# Patient Record
Sex: Male | Born: 1935 | Race: White | Hispanic: No | Marital: Married | Smoking: Former smoker
Health system: Southern US, Community
[De-identification: ages and names within clinical notes are randomized; demographics above are authoritative.]

## PROBLEM LIST (undated history)

## (undated) DIAGNOSIS — T07XXXA Unspecified multiple injuries, initial encounter: Secondary | ICD-10-CM

## (undated) DIAGNOSIS — D759 Disease of blood and blood-forming organs, unspecified: Secondary | ICD-10-CM

## (undated) DIAGNOSIS — I10 Essential (primary) hypertension: Secondary | ICD-10-CM

## (undated) DIAGNOSIS — K219 Gastro-esophageal reflux disease without esophagitis: Secondary | ICD-10-CM

## (undated) DIAGNOSIS — E119 Type 2 diabetes mellitus without complications: Secondary | ICD-10-CM

## (undated) DIAGNOSIS — I2510 Atherosclerotic heart disease of native coronary artery without angina pectoris: Secondary | ICD-10-CM

## (undated) DIAGNOSIS — D649 Anemia, unspecified: Secondary | ICD-10-CM

## (undated) HISTORY — DX: Anemia, unspecified: D64.9

---
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
## 2001-05-04 ENCOUNTER — Encounter (INDEPENDENT_AMBULATORY_CARE_PROVIDER_SITE_OTHER): Payer: Self-pay | Admitting: Specialist

## 2001-05-04 ENCOUNTER — Ambulatory Visit (HOSPITAL_COMMUNITY): Admission: RE | Admit: 2001-05-04 | Discharge: 2001-05-04 | Payer: Self-pay | Admitting: Gastroenterology

## 2003-02-24 ENCOUNTER — Encounter: Admission: RE | Admit: 2003-02-24 | Discharge: 2003-02-24 | Payer: Self-pay | Admitting: Geriatric Medicine

## 2003-08-23 ENCOUNTER — Ambulatory Visit (HOSPITAL_COMMUNITY): Admission: RE | Admit: 2003-08-23 | Discharge: 2003-08-23 | Payer: Self-pay | Admitting: Gastroenterology

## 2003-08-26 ENCOUNTER — Ambulatory Visit (HOSPITAL_COMMUNITY): Admission: RE | Admit: 2003-08-26 | Discharge: 2003-08-26 | Payer: Self-pay | Admitting: Gastroenterology

## 2003-11-14 ENCOUNTER — Inpatient Hospital Stay (HOSPITAL_COMMUNITY): Admission: RE | Admit: 2003-11-14 | Discharge: 2003-11-17 | Payer: Self-pay | Admitting: Orthopedic Surgery

## 2005-02-18 HISTORY — PX: JOINT REPLACEMENT: SHX530

## 2008-03-21 ENCOUNTER — Encounter: Admission: RE | Admit: 2008-03-21 | Discharge: 2008-03-21 | Payer: Self-pay | Admitting: Geriatric Medicine

## 2008-03-23 ENCOUNTER — Encounter: Admission: RE | Admit: 2008-03-23 | Discharge: 2008-03-23 | Payer: Self-pay | Admitting: Geriatric Medicine

## 2010-02-16 ENCOUNTER — Encounter
Admission: RE | Admit: 2010-02-16 | Discharge: 2010-02-16 | Payer: Self-pay | Source: Home / Self Care | Attending: Gastroenterology | Admitting: Gastroenterology

## 2010-04-02 ENCOUNTER — Ambulatory Visit (HOSPITAL_COMMUNITY)
Admission: RE | Admit: 2010-04-02 | Discharge: 2010-04-02 | Disposition: A | Discharge status: Home / Self Care | Payer: Medicare Other | Source: Ambulatory Visit | Attending: Gastroenterology | Admitting: Gastroenterology

## 2010-04-02 DIAGNOSIS — K219 Gastro-esophageal reflux disease without esophagitis: Secondary | ICD-10-CM | POA: Insufficient documentation

## 2010-07-06 NOTE — Discharge Summary (Signed)
[name], [name]                ACCOUNT NO.:  [id number]   MEDICAL RECORD NO.:  [id number]          PATIENT TYPE:  INP   LOCATION:  5037                         FACILITY:  [facility]   PHYSICIAN:  Feliberto Gottron. Turner Daniels, M.D.   DATE OF BIRTH:  December 18, 1922   DATE OF ADMISSION:  11/14/2003  DATE OF DISCHARGE:  11/17/2003                                 DISCHARGE SUMMARY   PRIMARY DIAGNOSIS:  End-stage degenerative joint disease of the right hip.   PROCEDURE:  Right total hip arthroplasty.   HISTORY OF PRESENT ILLNESS:  The patient is a 77 year old male with a many  year history of increasing pain in the right groin.  X-ray showed bone on  bone changes of DJD in the right hip.  He has failed conservative treatment  including NSAIDs, rest, cane use and PT.  He now wishes to proceed with  total hip arthroplasty after discussing risks versus benefits.   ALLERGIES:  No known drug allergies.   MEDICATION ON ADMISSION:  Tylenol.   PAST MEDICAL HISTORY:  Usual childhood illnesses.  Adult history positive  for hypertension and DJD.   PAST SURGICAL HISTORY:  Hernia repair in 1953.  No difficulty with GET.   SOCIAL HISTORY:  No tobacco.  Positive ethanol occasionally.  No IV drug  abuse.  He is married and retired.   FAMILY HISTORY:  Mother died at 48 with history of congestive heart  failure.  Father died at 77 with history of aneurysm.   REVIEW OF SYMPTOMS:  Positive for glasses and difficulty swallowing.  Denies  shortness of breath, chest pain or illness.   PHYSICAL EXAMINATION AT TIME OF ADMISSION:  GENERAL APPEARANCE:  The patient  is a 5 feet 10 inch, 196 pound male.  VITAL SIGNS:  Temperature 97.6, pulse 76, respiratory rate 20, blood  pressure 130/80.  HEENT:  Head is normocephalic and atraumatic.  Ears:  TMs are clear.  Eyes:  Pupils are equal, round, reactive to light and accommodation.  Nose is  patent.  Throat benign.  NECK:  Supple with full range of motion.  CHEST:  Clear to  auscultation and percussion.  CARDIOVASCULAR:  Regular rate and rhythm.  ABDOMEN:  Soft and nontender.  EXTREMITIES:  The right hip has range of motion with 5 degree of internal  rotation, 10 degrees of external rotation, positive pain to log roll.  Minimal pain to foot tap.  Otherwise neurovascularly intact.   X-ray showed bone on bone changes and weightbearing structures of the right  rib and left hip.   Preoperative labs including CBC, CMET, chest x-ray, EKG, PT and PTT are all  within normal limits with the exception of glucose of 158 and total protein  of 58.   HOSPITAL COURSE:  On the day of admission the patient was taken to the  operating room at [facility] where he underwent a right  total hip arthroplasty utilizing Osteonics, Trident acetabulum, 56 mm  acetabulum and Omniflex 132 degree hip stem #8 and 10 degree polyethylene  insert to accept a 36 mm head, distal  cement spacer 16 mm and cement plug  size 7.  Hip stem was cemented with polymethyl methacrylate that had been  impregnated with Zinacef.  The cup was not cemented.  The patient was placed  on perioperative antibiotics.  He was placed on postoperative Coumadin  prophylaxis with a target INR of 1.52.  He was placed on postoperative  Dilaudid PCA for pain control.   On postoperative day #1, the patient was without complaints.  No nausea or  vomiting.  Voiding without difficulty with hemoglobin 11.5, INR 1.2.  He was  afebrile, vital signs were stable.  Dressing was dry.  Thigh was soft.  He  was neurovascularly intact to light touch and motors.  He continued with his  physical therapy.  PCA was discontinued.  Discharge planning was begun for  home health care.   On postoperative day #2, the patient was without complaints.  T-max 100.3,  ranging 99.5.  Hemoglobin 11.4, INR 1.6.  Continued to make steady progress  in physical therapy and occupational therapy.   On postoperative day #3, the  patient was without complaints, making steady  progress in PT.  He was afebrile.  Vital signs were stable.  Hemoglobin  11.3, INR 1.6.  Dressing was dry.  Wound was benign.  Thigh was soft and  minimally tender.  He was discharged home after completing with PT and OT  goals having been met.   DISCHARGE MEDICATIONS:  1.  Tylox.  2.  Coumadin per [facility] x2 weeks postoperatively.  3.  Resume all home medications.   DIET:  Regular.   ACTIVITY:  Weightbearing as tolerated with total hip precautions using  walker or crutches.  Dressing changes every other day.  May shower  postoperative day #7 seated.  Home health PT, home health RN and Durable  Goods per [facility].   Return to clinic in approximately one week with Dr. Turner Daniels.  Call [phone]  for appointment, sooner if he should have any difficulties with increased  temperature, increased pain or drainage from the wound.      Lidia Collum  D:  12/12/2003  T:  12/12/2003  Job:  [id number]

## 2010-07-06 NOTE — Op Note (Signed)
[name], [name]                ACCOUNT NO.:  [id number]   MEDICAL RECORD NO.:  [id number]          PATIENT TYPE:  INP   LOCATION:  2870                         FACILITY:  [facility]   PHYSICIAN:  Feliberto Gottron. Turner Daniels, M.D.   DATE OF BIRTH:  [birth year]   DATE OF PROCEDURE:  11/14/2003  DATE OF DISCHARGE:                                 OPERATIVE REPORT   PREOPERATIVE DIAGNOSIS:  Degenerative arthritis of the right hip.   POSTOPERATIVE DIAGNOSIS:  Degenerative arthritis of the right hip.   OPERATION PERFORMED:  Right total hip arthroplasty using Osteonics 56 mm  Secure-Fit HA cup, 36 mm poly liner with a 10 degree lip, a #8 ODC FX stem,  16 tip, #7 cement restricter and an NK +5 36 mm cobalt chrome ball.   SURGEON:  Feliberto Gottron. Turner Daniels, M.D.   ASSISTANTLaural Benes. Jannet Mantis.   ANESTHESIA:  General endotracheal.   ESTIMATED BLOOD LOSS:  300 mL.   FLUIDS REPLACED:  1500 mL crystalloid.   DRAINS:  None.   TOURNIQUET TIME:  None.   INDICATIONS FOR PROCEDURE:  The patient is a 77 year old gentleman I have  followed for a number of years for end stage arthritis of the right hip that  is now so severe it is interfering with ambulation as well as swimming which  is one of his recreational activities.  He has failed conservative treatment  with anti-inflammatory medication, observation, use of a walker and desires  elective right total hip arthroplasty, well aware of the risks and benefits  of surgery as are his family.   DESCRIPTION OF PROCEDURE:  The patient was identified by arm band and taken  to the operating room at [facility] main hospital where appropriate anesthetic  monitors were attached and general endotracheal anesthesia induced with the  patient in the supine position.  The patient was then rolled into the left  lateral decubitus position and fixed there with a Stulberg Mark 2 pelvic  clamp and the right lower extremity prepped and draped in the usual sterile  fashion from the ankle  to the hemipelvis.  We then made a 15 cm incision  centered over the greater trochanter allowing a posterolateral approach to  the hip through the skin and subcutaneous tissue.  Small bleeders were  identified and cauterized.  The iliotibial band was cut in line with the  skin incision exposing the greater trochanter.  A Cobra retractor was placed  between the superior hip joint capsule and the gluteus minimus and a second  cobra between the quadratus femoris and the inferior hip joint capsule.  The  piriformis and short external rotators were tagged with one #2 Ethibond  suture and cut off at their insertion on the intertrochanteric crest  exposing the posterior aspect of the hip joint capsule which was then  developed into an acetabular based flap.  The rectus femoris origin was  recessed with the electrocautery and posterior superior and posterior  inferior wing retractors were placed in the acetabulum.  This allowed  removal of the labrum which was noted to be significantly  arthritic after  dislocating the femoral head.  The femoral head was noted to have bare bone  arthritis and a standard neck cut was performed one fingerbreadth above the  lesser trochanter.  This enhanced our acetabular exposure and we then  sequentially reamed up to a 56 mm basket reamer obtaining good cut in  anterior posterior columns as well as superior.  We then hammered into place  a 56 mm Secure-Fit HA shell in 45 degrees abduction and 20 degrees of  anteversion and placed a 10 degree trial poly liner 36 mm with index  posterior and superior.  It was then flexed and internally rotated, exposing  the proximal femur which was then reamed up to a #8 tapered reamer followed  by broaching up to a #8 broach which had the correct fit and feel for the  proximal femur.  We then finished the neck cut with the calcar reamers and a  standard 35 neck and a +0 and +5 36 mm balls were placed on the broach and  trial  reduction was performed.  The best fit and stability was with the +5.  We could flex to 90 and internally rotate to 50 and had to come to full  extension and bend the knee well past 120 degrees.  At this point the trial  components were removed.  A real 36 mm cross-fire polyethylene liner was  placed in the shell after putting in the central occluder screw first with  the index posterior and superior.  We then sized for a #7 cement restricter  which was then inserted into the appropriate depth in the proximal femur and  the proximal femur was then pulse lavaged clean and dried with suction and  sponges.  A double batch of Howmedica Simplex polymethyl methacrylate cement  with 1500 mg of Zinacef was then mixed and injected into the proximal femur  under pressure followed by a #8 ODC stem with a 16 mm tip in 20 degrees of  anteversion.  Excess cement was removed as the cement was allowed to cure  and an NK +5 36 mm ball was then hammered onto the stem.  The hip was  reduced, stability checked and found to be excellent.  The wound was  irrigated out with normal saline solution and pulse lavaged.  The capsule  and short external rotators were repaired back to the intertrochanteric  crest through drill holes and a #2 Ethibond suture.  The  iliotibial band  was closed with running #1 Vicryl suture.  The subcutaneous tissue with 0  and 2-0 undyed Vicryl suture.  A dressing of Xeroform, 4 x 4 dressing  sponges, and HypaFix tape was applied.  The patient was rolled supine  awakened and taken to the recovery room without difficulty.       FJR/MEDQ  D:  11/14/2003  T:  11/14/2003  Job:  [id number]

## 2010-07-06 NOTE — Op Note (Signed)
[name], [name]                ACCOUNT NO.:  [id number]   MEDICAL RECORD NO.:  [id number]          PATIENT TYPE:  INP   LOCATION:  5037                         FACILITY:  [facility]   PHYSICIAN:  Feliberto Gottron. Turner Daniels, M.D.   DATE OF BIRTH:  1923/01/03   DATE OF PROCEDURE:  11/14/2003  DATE OF DISCHARGE:  11/17/2003                                 OPERATIVE REPORT   PREOPERATIVE DIAGNOSIS:  End-stage degenerative arthritis of the right hip.   POSTOPERATIVE DIAGNOSIS:  End-stage degenerative arthritis of the right hip.   PROCEDURE:  Right total hip arthroplasty.   ANESTHESIA:  General endotracheal.   SURGEON:  Feliberto Gottron. Turner Daniels, M.D.   FIRST ASSISTANT:  Harvie Junior, M.D.   ESTIMATED BLOOD LOSS:  300 mL.   FLUIDS REPLACED:  300 mL crystalloid.   DRAINS PLACED:  None.   Implants were as follows:  We used a 54 mm Howmedica Trident acetabular  shell.  We used a #8 Omnifit HFX stem, 10 degree liner, 36 mm head, 16 mm  cement restricter, and a #7 cement plug.   INDICATION FOR PROCEDURE:  A 77 year old gentleman with end-stage arthritis  of the right hip, who has failed conservative treatment with anti-  inflammatory medicines and observation.  It is affecting his ability to  ambulate, wakes him up at night, and it may cause him to fall at some point  if he continues to have severe pain.  He also has very mild Alzheimer's  disease but lives with his family and is well cared for.  X-rays showed bone-  on-bone arthritic changes.   DESCRIPTION OF PROCEDURE:  The patient was identified by arm band, taken to  the operating room at [facility] main hospital, appropriate anesthetic monitors  were attached and general endotracheal anesthesia induced with the patient  in the supine position.  He was then rolled into the left lateral decubitus  position and fixed there with a Veronda Prude II pelvic clamp and the right  lower extremity prepped and draped in the usual sterile fashion from the  ankle to  the hemipelvis.  The skin along the lateral hip and thigh was then  infiltrated with 20 mL of 0.5% Marcaine and epinephrine solution and a 15 cm  incision allowing a posterolateral approach to the hip was then made,  centered over the greater trochanter.  Small bleeders in the skin and  subcutaneous tissue were identified and cauterized.  The IT band was cut in  line with the skin incision, exposing the greater trochanter. A cobra  retractor was placed between the gluteus minimus and the superior hip joint  capsule and the second between the quadratus femoris and the inferior hip  joint capsule.  This isolated the short external rotators and piriformis,  which was then cut off their insertion on the intertrochanteric crest and  tagged with two #2 Ethibond sutures.  This exposed the posterior acetabular  capsule, which was then developed into an acetabular-based flap and likewise  tagged with two #2 Ethibond sutures.  The hip was then flexed and internally  rotated, dislocating  the femoral head, which was noted to be completely  arthritic, bone and bone, in the __________ region, and then had a standard  neck cut performed one fingerbreadth above the lesser trochanter with a  power saw.  The proximal femur was then translocated anteriorly using a  Hohmann retractor, levering off the anterior column.  A cobra was placed in  the cotyloid notch and then posterior superior and posterior inferior wing  retractors were placed, allowing exposure of the acetabulum.  The labrum was  removed with the electrocautery.  Satisfied with the exposure, we then  reamed up to a 54 mm basket reamer, obtaining good coverage in all quadrants  down to subchondral bleeding bone.  We then hammered into place a 54 mm  Trident acetabular PSL shell without difficulty and placed a trial 10 degree  liner for a 36 head.  The shell was placed in 45 degrees of abduction and  about 20 degrees of anteversion.  The hip was  flexed and internally rotated,  exposing the proximal femur, which was entered with the tapered reamers,  reaming up to a #8 tapered reamer followed by broaching up to a #8 Osteonics  broach and calcar milling to create a flush calcar cut.  With the broach in  place, a 30 base neck and a +0 36 mm trial head were placed, the hip  reduced, and excellent stability noted.  The stem was in about 20 degrees of  anteversion following the calcar.  He could flex to 75 and internally rotate  to 60 before there was any dislocation and going into extension and external  rotation, he could not be dislocated.  The leg length was appropriate-  looking at the center of the ball at the same level as the greater  trochanter, and in extension the knee could be flexed fully.  At this point  the trial components were removed.  The wound was irrigated out with normal  saline solution.  A real polyethylene liner 10 degree was hammered into the  shell after first placing a centralizing screw plug.  The proximal femur was  then irrigated out with pulse lavage.  We sized for a #7 cement restricter  that was hammered to the appropriate depth, further lavage, and then drying  with suction and sponges.  A double batch of Howmedica Simplex cement with  1500 mg of Zinacef was then mixed and injected into the proximal femur with  the cement gun.  We then inserted a #8 stem and removed excess cement.  The  stem was held in 20 degrees of anteversion and the cement allowed to cure.  An NK +0 36 mm ball, L-fit, was then hammered onto the stem, the hip again  reduced, and excellent stability noted.  At this time we checked for any  further significant bleeders, none were found.  The wound was irrigated out  a final time with normal saline solution, and then the acetabular flap,  short external rotators were repaired back to the intertrochanteric crest through drill holes.  The IT band was closed with running #1 Vicryl suture,   the subcutaneous tissue with 0 and 2-0 undyed Vicryl suture, and the skin  with running interlocking 3-0 nylon suture.  A dressing of Xeroform, 4 x 4  dressing sponges and Hypafix tape applied.  The patient was unclamped,  rolled supine, awakened, and taken to the recovery room without difficulty.      Emilio Aspen  D:  01/02/2004  T:  01/02/2004  Job:  [id number]

## 2010-07-06 NOTE — Op Note (Signed)
NAME:  [name], [name]                          ACCOUNT NO.:  [id number]   MEDICAL RECORD NO.:  [id number]                   PATIENT TYPE:  AMB   LOCATION:  ENDO                                 FACILITY:  [facility]   PHYSICIAN:  James L. Malon Kindle., M.D.          DATE OF BIRTH:  May 08, 1922   DATE OF PROCEDURE:  08/22/2003  DATE OF DISCHARGE:                                 OPERATIVE REPORT   PROCEDURE:  Esophagogastroduodenoscopy.   MEDICATIONS:  Cetacaine spray, fentanyl 37.5 mcg, Versed 4 mg IV.   INDICATIONS:  The patient had a barium swallow for dysphagia, showed a  severe esophageal motility disorder.  This is done to evaluate for  stricture, achalasia, tumor, etc.   DESCRIPTION OF PROCEDURE:  The procedure had been explained to the patient  and consent obtained.  With the patient in the left lateral decubitus  position, the Olympus scope was inserted and advanced.  It advanced in the  esophagus easily with agglutination.  The distal esophagus was  endoscopically normal.  The GE junction opened easily.  We passed into the  stomach.  A complete endoscopy was performed, the gastric outlet identified  and passed.  The duodenum including the bulb and second portion was normal.  We got back into the stomach.  The pyloric channel was normal.  The antrum  and body of the stomach were normal.  The fundus and cardia were seen well  in the retroflexed view.  There was no sign of any tumor mass or anything of  that nature.  The scope was withdrawn and the GE junction again was normal.  There was no abnormality seen in the distal or proximal esophagus.  There  was no air-fluid level.  The proximal esophagus was seen well and was  normal.  The hypopharynx, etc., was seen well upon removal of the scope and  was normal.  The patient tolerated the procedure well and was maintained on  low-flow oxygen and pulse oximetry throughout the procedure.   ASSESSMENT:  Abnormal gastrointestinal x-ray  suggestive of a possible  achalasia versus motility disorder, no abnormalities on upper endoscopy.   PLAN:  Will schedule an outpatient manometry and see back in the office in  three to four weeks.                                               James L. Malon Kindle., M.D.    Waldron Session  D:  08/23/2003  T:  08/23/2003  Job:  [id number]   cc:   Hal T. Stoneking, M.D.  [address] [place], [place] [address]  Fax: [phone]

## 2010-07-06 NOTE — Op Note (Signed)
NAME:  [name], [name]                          ACCOUNT NO.:  [id number]   MEDICAL RECORD NO.:  [id number]                   PATIENT TYPE:  AMB   LOCATION:  ENDO                                 FACILITY:  [facility]   PHYSICIAN:  James L. Malon Kindle., M.D.          DATE OF BIRTH:  07-17-1922   DATE OF PROCEDURE:  08/26/2003  DATE OF DISCHARGE:  08/26/2003                                 OPERATIVE REPORT   PROCEDURE:  Esophageal manometry.   INDICATIONS FOR PROCEDURE:  Patient with persistent dysphagia.  Work-up to  date has shown dysmotility without narrowing.  A closure was suspected.  We  went ahead and did an endoscopy. This is done to evaluate for motility  disorder.  Barium swallow is suggested.   DESCRIPTION OF PROCEDURE:  The procedure was explained to the patient and he  underwent an manometry in the manometry lab according to the usual  protocols.  The results are as follows:  1. Upper esophageal sphincter not well seen.  2. Esophageal body marked increase in amplitude of 234 which is markedly     increased with normal in our lab being less than 180 with an increased     duration of 7.4 seconds and the distal esophagus normal being less than     6.0.  3. Lower esophageal sphincter marked increase in resting pressure of 91.7,     normal being less than 45.  The LAS does appear to relax with swallowing     by computer measurement as well as by my interpretation of the tracings     with an 82% relaxation.   ASSESSMENT:  Motility disorder that is not characteristic for achalasia.  In  the past, this has been termed vigorous achalasia with LES pressures  elevated but still with peristalsis in the esophagus of high amplitude.  This is a nonspecific finding and may well progress to achalasia.   PLAN:  Will see the patient back in the office and discuss all this with him  further.                                               James L. Malon Kindle., M.D.    Waldron Session  D:   09/03/2003  T:  09/04/2003  Job:  [id number]   cc:   Hal T. Stoneking, M.D.  [address] [place], [place] [address]  Fax: [phone]

## 2010-07-06 NOTE — Procedures (Signed)
[facility]  Patient:    [name], [name] Visit Number: [id number] MRN: [id number]          Service Type: END Location: ENDO Attending Physician:  Orland Mustard Dictated by:   Llana Aliment. Randa Evens, M.D. Proc. Date: 05/04/01 Admit Date:  05/04/2001   CC:         Dr. Dianna Limbo   Procedure Report  DATE OF BIRTH:  [birth year]  PROCEDURE:  Colonoscopy and polypectomy.  MEDICATIONS:  Fentanyl 75 mcg, Versed 6 mg IV.  SCOPE:  Pediatric Olympus video colonoscope.  INDICATIONS FOR PROCEDURE:  Routine colon cancer screening.  DESCRIPTION OF PROCEDURE:  The procedure had been explained to the patient and consent obtained. With the patient in the left lateral decubitus position, the Olympus pediatric video colonoscope was inserted and advanced under direct visualization. The prep excellent. We were able to reach cecum without difficulty. The ileocecal valve and appendiceal orifice were seen, scope withdrawn. The cecum, ascending colon, hepatic flexure, transverse colon, splenic flexure, descending and sigmoid colon were seen well. In the proximal descending colon, a 3 mm polyp was encountered and was cauterized and placed in jar #1. In the additional descending colon, a 3/4 cm polyp was snared and sucked through the scope and placed in jar #2. The remainder of the sigmoid colon and rectum were free of polyps and other lesions. The scope was withdrawn and the patient tolerated the procedure well and was maintained on low flow oxygen and pulse oximeter throughout the procedure.  ASSESSMENT:  Polyps removed from the descending colon.  PLAN:  Routine post polypectomy instructions. Will recommend repeating procedure in three years. Dictated by:   Llana Aliment. Randa Evens, M.D. Attending Physician:  Orland Mustard DD:  05/04/01 TD:  05/05/01 Job: [id number]

## 2011-11-20 ENCOUNTER — Telehealth (INDEPENDENT_AMBULATORY_CARE_PROVIDER_SITE_OTHER): Payer: Self-pay | Admitting: General Surgery

## 2011-11-20 NOTE — Telephone Encounter (Signed)
[facility] letting them know this pt has an appt set up with Dr. Daphine Deutscher on 10/10 at 12:00.

## 2011-11-28 ENCOUNTER — Ambulatory Visit (INDEPENDENT_AMBULATORY_CARE_PROVIDER_SITE_OTHER): Payer: Medicare Other | Admitting: Surgery

## 2011-11-28 ENCOUNTER — Encounter (INDEPENDENT_AMBULATORY_CARE_PROVIDER_SITE_OTHER): Payer: Self-pay | Admitting: Surgery

## 2011-11-28 ENCOUNTER — Telehealth (INDEPENDENT_AMBULATORY_CARE_PROVIDER_SITE_OTHER): Payer: Self-pay | Admitting: General Surgery

## 2011-11-28 VITALS — BP 138/88 | HR 70 | Temp 98.0°F | Resp 18 | Ht 68.0 in | Wt 195.4 lb

## 2011-11-28 DIAGNOSIS — K220 Achalasia of cardia: Secondary | ICD-10-CM | POA: Insufficient documentation

## 2011-11-28 NOTE — Telephone Encounter (Signed)
Message copied by Littie Deeds on Thu Nov 28, 2011  2:20 PM ------      Message from: Cathi Roan      Created: Thu Nov 28, 2011  1:21 PM      Regarding: 1st post op      Contact: [phone]       [phone] Needs 1st  Post op for Lap Heilla

## 2011-11-28 NOTE — Telephone Encounter (Signed)
Spoke with patient and informed him that his first PO appt w/ Dr. Kerry Fort will be on 1120 at 9:40.  I also informed him that I have already called Dr. Randa Evens office and am waiting to hear back from them on when they will schedule for him to have the endoscope .

## 2011-11-28 NOTE — Patient Instructions (Signed)
Achalasia Achalasia is a condition in which a person cannot get food through the lower esophagus. This is the tube that carries food from your mouth to your stomach. This happens because there are no nerves to the lower esophagus and the esophageal sphincter. This is the circular muscle between the stomach and esophagus that relaxes to allow food into the stomach. It then contracts to keep food in the stomach. This absence of nerves may be present since birth. This condition causes difficulty swallowing, chest pain, and food coming back up in the mouth after being swallowed. DIAGNOSIS  This condition is diagnosed by x-ray, pressure studies in the esophagus, and endoscopy. This is when your caregiver looks into your esophagus with a small flexible telescope. The portion of the esophagus above the narrowing is usually enlarged when the condition is present. TREATMENT   Soft diets are helpful.  Medications will help the food pass more easily into the stomach.  If conservative treatment (as above) does not work, stretching the end of the esophagus with a balloon may help.  Sometimes surgical treatment is used and a segment of esophagus is removed. SEEK IMMEDIATE MEDICAL CARE IF:  You are unable to keep fluids down or it feels as though food sticks in your chest area.  Vomiting becomes persistent.  Chest or belly pain develops, increases, or localizes.  You have a fever. If problems are continuing and not allowing you to live a normal lifestyle, talk with your caregiver and discuss medical means to help this. Document Released: 11/14/2004 Document Revised: 04/29/2011 Document Reviewed: 02/27/2006 Exit[employer] Patient Information 2013 [employer].  

## 2011-11-28 NOTE — Progress Notes (Signed)
Chief Complaint:  achalasia  History of Present Illness:  [name] is an 78 y.o. male who has been seen by Dr. Jim Edwards with achalasia. He's had this for some time and he scattered worsening symptoms. I saw him with his wife and he has gotten worse to where he is spitting up now and wants to have something done. I discussed Botox therapy with him as to Dr. Edwards that he would like to go ahead and think have the surgery. He does not appear his stated age his he looks to be about early 70s. He works out daily at the [employer] and manages to stay fit.  I described our myotomy with him and also discussed a new endoscopic myotomy with him as well. I discussed the complications of perforation and the risk is aware that. However like for Dr. Edwards to endoscope him upper days preop and lavages patulous esophagus. I did review his upper GI series after Dr. Edwards discussed them with me last week.  He wants to get this set up and we'll set up at [place] his convenience.  No past medical history on file.  Past Surgical History  Procedure Date  . Joint replacement 2007    rt hip   . Hernia repair 1952    Current Outpatient Prescriptions  Medication Sig Dispense Refill  . aspirin 81 MG tablet Take 325 mg by mouth daily. 1/2 aspirin qd      . calcium-vitamin D (OSCAL WITH D) 500-200 MG-UNIT per tablet Take 1 tablet by mouth daily.      . fish oil-omega-3 fatty acids 1000 MG capsule Take 2 g by mouth daily.      . multivitamin-lutein (OCUVITE-LUTEIN) CAPS Take 1 capsule by mouth daily.       Review of patient's allergies indicates no known allergies. Family History  Problem Relation Age of Onset  . Heart disease Mother   . Heart disease Father    Social History:   reports that he has quit smoking. He does not have any smokeless tobacco history on file. His alcohol and drug histories not on file.   REVIEW OF SYSTEMS - PERTINENT POSITIVES ONLY: Neg for DVT  Physical Exam:     Blood pressure 138/88, pulse 70, temperature 98 F (36.7 C), temperature source Oral, resp. rate 18, height 5' 8" (1.727 m), weight 195 lb 6.4 oz (88.633 kg). Body mass index is 29.71 kg/(m^2).  Gen:  WDWN WM NAD  Neurological: Alert and oriented to person, place, and time. Motor and sensory function is grossly intact  Head: Normocephalic and atraumatic.  Eyes: Conjunctivae are normal. Pupils are equal, round, and reactive to light. No scleral icterus.  Neck: Normal range of motion. Neck supple. No tracheal deviation or thyromegaly present.  Cardiovascular:  SR without murmurs or gallops.  No carotid bruits Respiratory: Effort normal.  No respiratory distress. No chest wall tenderness. Breath sounds normal.  No wheezes, rales or rhonchi.  Abdomen:  Flat and nontender GU: Musculoskeletal: Normal range of motion. Extremities are nontender. No cyanosis, edema or clubbing noted Lymphadenopathy: No cervical, preauricular, postauricular or axillary adenopathy is present Skin: Skin is warm and dry. No rash noted. No diaphoresis. No erythema. No pallor. Pscyh: Normal mood and affect. Behavior is normal. Judgment and thought content normal.   LABORATORY RESULTS: No results found for this or any previous visit (from the past 48 hour(s)).  RADIOLOGY RESULTS: No results found.  Problem List: There is no problem list   on file for this patient.   Assessment & Plan: Achalasia Lap Heller myotomy    Matt B. Triston Lisanti, MD, FACS  [facility] [phone] beeper [phone]  11/28/2011 12:41 PM     

## 2011-11-29 ENCOUNTER — Telehealth (INDEPENDENT_AMBULATORY_CARE_PROVIDER_SITE_OTHER): Payer: Self-pay | Admitting: General Surgery

## 2011-11-29 NOTE — Telephone Encounter (Signed)
Dr. Randa Evens office called to let me know that Mr. Zieger is set up to have his endoscope on 10/22 and that they would share their finding with [place] after his procedure.

## 2011-12-09 ENCOUNTER — Encounter (HOSPITAL_COMMUNITY): Payer: Self-pay | Admitting: Pharmacy Technician

## 2011-12-12 ENCOUNTER — Encounter (HOSPITAL_COMMUNITY)
Admission: RE | Admit: 2011-12-12 | Discharge: 2011-12-12 | Disposition: A | Discharge status: Home / Self Care | Payer: Medicare Other | Source: Ambulatory Visit | Attending: Surgery | Admitting: Surgery

## 2011-12-12 ENCOUNTER — Ambulatory Visit (HOSPITAL_COMMUNITY)
Admission: RE | Admit: 2011-12-12 | Discharge: 2011-12-12 | Disposition: A | Discharge status: Home / Self Care | Payer: Medicare Other | Source: Ambulatory Visit | Attending: Surgery | Admitting: Surgery

## 2011-12-12 ENCOUNTER — Encounter (HOSPITAL_COMMUNITY): Payer: Self-pay

## 2011-12-12 ENCOUNTER — Telehealth (INDEPENDENT_AMBULATORY_CARE_PROVIDER_SITE_OTHER): Payer: Self-pay | Admitting: General Surgery

## 2011-12-12 DIAGNOSIS — Z01812 Encounter for preprocedural laboratory examination: Secondary | ICD-10-CM | POA: Insufficient documentation

## 2011-12-12 DIAGNOSIS — Z01818 Encounter for other preprocedural examination: Secondary | ICD-10-CM | POA: Insufficient documentation

## 2011-12-12 DIAGNOSIS — K220 Achalasia of cardia: Secondary | ICD-10-CM | POA: Insufficient documentation

## 2011-12-12 HISTORY — DX: Gastro-esophageal reflux disease without esophagitis: K21.9

## 2011-12-12 HISTORY — DX: Type 2 diabetes mellitus without complications: E11.9

## 2011-12-12 LAB — CBC
HCT: 42.3 % (ref 39.0–52.0)
Hemoglobin: 14.6 g/dL (ref 13.0–17.0)
MCH: 29.6 pg (ref 26.0–34.0)
MCHC: 34.5 g/dL (ref 30.0–36.0)
MCV: 85.6 fL (ref 78.0–100.0)
RDW: 13.4 % (ref 11.5–15.5)

## 2011-12-12 LAB — BASIC METABOLIC PANEL
BUN: 18 mg/dL (ref 6–23)
Creatinine, Ser: 1.14 mg/dL (ref 0.50–1.35)
GFR calc Af Amer: 65 mL/min — ABNORMAL LOW (ref >90)
GFR calc non Af Amer: 56 mL/min — ABNORMAL LOW (ref >90)
Glucose, Bld: 118 mg/dL — ABNORMAL HIGH (ref 70–99)

## 2011-12-12 MED ORDER — CHLORHEXIDINE GLUCONATE 4 % EX LIQD: 1.0000 "application " | Freq: Once | CUTANEOUS | Status: DC

## 2011-12-12 NOTE — Pre-Procedure Instructions (Signed)
PREOP CBC, BMET, EKG AND CXR WERE DONE TODAY AT [facility] AS PER ANESTHESIOLOGIST'S GUIDELINES. 

## 2011-12-12 NOTE — Telephone Encounter (Signed)
Pat called to ask Dr. Daphine Deutscher if patient needed special diet before his surgery next week? I asked Dr. Daphine Deutscher and he said for pt to drink protein shakes until surgery. This was told to [facility] and she will tell pt/gy

## 2011-12-12 NOTE — Patient Instructions (Addendum)
YOUR SURGERY IS SCHEDULED AT [facility]  ON:  Tuesday  10/29  AT 12:45 PM  REPORT TO [facility] SHORT STAY CENTER AT:  10:45 AM      PHONE # FOR SHORT STAY IS [phone]  STOP ASPIRIN AND FISH OIL TODAY  FLEETS ENEMA NIGHT BEFORE YOUR SURGERY  DO NOT EAT OR DRINK ANYTHING AFTER MIDNIGHT THE NIGHT BEFORE YOUR SURGERY.  YOU MAY BRUSH YOUR TEETH, RINSE OUT YOUR MOUTH--BUT NO WATER, NO FOOD, NO CHEWING GUM, NO MINTS, NO CANDIES, NO CHEWING TOBACCO.  PLEASE TAKE THE FOLLOWING MEDICATIONS THE AM OF YOUR SURGERY WITH A FEW SIPS OF WATER:  PRILOSEC   IF YOU USE INHALERS--USE YOUR INHALERS THE AM OF YOUR SURGERY AND BRING INHALERS TO THE HOSPITAL -TAKE TO SURGERY.    IF YOU ARE DIABETIC:  DO NOT TAKE ANY DIABETIC MEDICATIONS THE AM OF YOUR SURGERY.  IF YOU TAKE INSULIN IN THE EVENINGS--PLEASE ONLY TAKE 1/2 NORMAL EVENING DOSE THE NIGHT BEFORE YOUR SURGERY.  NO INSULIN THE AM OF YOUR SURGERY.  IF YOU HAVE SLEEP APNEA AND USE CPAP OR BIPAP--PLEASE BRING THE MASK AND THE TUBING.  DO NOT BRING YOUR MACHINE.  DO NOT BRING VALUABLES, MONEY, CREDIT CARDS.  DO NOT WEAR JEWELRY, MAKE-UP, NAIL POLISH AND NO METAL PINS OR CLIPS IN YOUR HAIR. CONTACT LENS, DENTURES / PARTIALS, GLASSES SHOULD NOT BE WORN TO SURGERY AND IN MOST CASES-HEARING AIDS WILL NEED TO BE REMOVED.  BRING YOUR GLASSES CASE, ANY EQUIPMENT NEEDED FOR YOUR CONTACT LENS. FOR PATIENTS ADMITTED TO THE HOSPITAL--CHECK OUT TIME THE DAY OF DISCHARGE IS 11:00 AM.  ALL INPATIENT ROOMS ARE PRIVATE - WITH BATHROOM, TELEPHONE, TELEVISION AND WIFI INTERNET.  IF YOU ARE BEING DISCHARGED THE SAME DAY OF YOUR SURGERY--YOU CAN NOT DRIVE YOURSELF HOME--AND SHOULD NOT GO HOME ALONE BY TAXI OR BUS.  NO DRIVING OR OPERATING MACHINERY FOR 24 HOURS FOLLOWING ANESTHESIA / PAIN MEDICATIONS.  PLEASE MAKE ARRANGEMENTS FOR SOMEONE TO BE WITH YOU AT HOME THE FIRST 24 HOURS AFTER SURGERY. RESPONSIBLE DRIVER'S NAME___________________________                              PHONE #   _______________________                                  PLEASE READ OVER ANY  FACT SHEETS THAT YOU WERE GIVEN: MRSA INFORMATION, BLOOD TRANSFUSION INFORMATION, INCENTIVE SPIROMETER INFORMATION.

## 2011-12-13 ENCOUNTER — Encounter (INDEPENDENT_AMBULATORY_CARE_PROVIDER_SITE_OTHER): Payer: Self-pay

## 2011-12-17 ENCOUNTER — Inpatient Hospital Stay (HOSPITAL_COMMUNITY)
Admission: RE | Admit: 2011-12-17 | Discharge: 2011-12-20 | DRG: 328 | Disposition: A | Discharge status: Home / Self Care | Payer: Medicare Other | Source: Ambulatory Visit | Attending: Surgery | Admitting: Surgery

## 2011-12-17 ENCOUNTER — Encounter (HOSPITAL_COMMUNITY)
Admission: RE | Disposition: A | Discharge status: Home / Self Care | Payer: Self-pay | Source: Ambulatory Visit | Attending: Surgery

## 2011-12-17 ENCOUNTER — Encounter (HOSPITAL_COMMUNITY): Payer: Self-pay | Admitting: Anesthesiology

## 2011-12-17 ENCOUNTER — Ambulatory Visit (HOSPITAL_COMMUNITY): Payer: Medicare Other | Admitting: Anesthesiology

## 2011-12-17 ENCOUNTER — Encounter (HOSPITAL_COMMUNITY): Payer: Self-pay | Admitting: *Deleted

## 2011-12-17 DIAGNOSIS — K224 Dyskinesia of esophagus: Secondary | ICD-10-CM | POA: Diagnosis present

## 2011-12-17 DIAGNOSIS — K220 Achalasia of cardia: Secondary | ICD-10-CM

## 2011-12-17 DIAGNOSIS — K219 Gastro-esophageal reflux disease without esophagitis: Secondary | ICD-10-CM | POA: Diagnosis present

## 2011-12-17 HISTORY — PX: UPPER GI ENDOSCOPY: SHX6162

## 2011-12-17 HISTORY — PX: HELLER MYOTOMY: SHX5259

## 2011-12-17 LAB — CBC
HCT: 42.4 % (ref 39.0–52.0)
Hemoglobin: 14.9 g/dL (ref 13.0–17.0)
MCV: 84.6 fL (ref 78.0–100.0)
RBC: 5.01 MIL/uL (ref 4.22–5.81)
RDW: 13.2 % (ref 11.5–15.5)
WBC: 13.2 10*3/uL — ABNORMAL HIGH (ref 4.0–10.5)

## 2011-12-17 LAB — CREATININE, SERUM
GFR calc Af Amer: 64 mL/min — ABNORMAL LOW (ref >90)
GFR calc non Af Amer: 55 mL/min — ABNORMAL LOW (ref >90)

## 2011-12-17 LAB — GLUCOSE, CAPILLARY

## 2011-12-17 SURGERY — ESOPHAGOMYOTOMY, LAPAROSCOPIC, HELLER
Anesthesia: General | Site: Esophagus | Wound class: Clean Contaminated

## 2011-12-17 MED ORDER — PANTOPRAZOLE SODIUM 40 MG IV SOLR
40.0000 mg | Freq: Every day | INTRAVENOUS | Status: DC
  Administered 2011-12-17 – 2011-12-19 (×3): 40 mg via INTRAVENOUS
  Filled 2011-12-17 (×4): qty 40

## 2011-12-17 MED ORDER — ONDANSETRON HCL 4 MG/2ML IJ SOLN
INTRAMUSCULAR | Status: DC | PRN
  Administered 2011-12-17: 4 mg via INTRAVENOUS

## 2011-12-17 MED ORDER — DIPHENHYDRAMINE HCL 12.5 MG/5ML PO ELIX: 12.5000 mg | ORAL_SOLUTION | Freq: Four times a day (QID) | ORAL | Status: DC | PRN

## 2011-12-17 MED ORDER — CEFOXITIN SODIUM-DEXTROSE 1-4 GM-% IV SOLR (PREMIX)
INTRAVENOUS | Status: AC
  Filled 2011-12-17: qty 100

## 2011-12-17 MED ORDER — ONDANSETRON HCL 4 MG/2ML IJ SOLN: 4.0000 mg | Freq: Four times a day (QID) | INTRAMUSCULAR | Status: DC | PRN

## 2011-12-17 MED ORDER — ACETAMINOPHEN 10 MG/ML IV SOLN
INTRAVENOUS | Status: DC | PRN
  Administered 2011-12-17: 1000 mg via INTRAVENOUS

## 2011-12-17 MED ORDER — PROPOFOL 10 MG/ML IV BOLUS
INTRAVENOUS | Status: DC | PRN
  Administered 2011-12-17: 130 mg via INTRAVENOUS

## 2011-12-17 MED ORDER — PROMETHAZINE HCL 25 MG/ML IJ SOLN: 6.2500 mg | INTRAMUSCULAR | Status: DC | PRN

## 2011-12-17 MED ORDER — HEPARIN SODIUM (PORCINE) 5000 UNIT/ML IJ SOLN
5000.0000 [IU] | Freq: Once | INTRAMUSCULAR | Status: AC
  Administered 2011-12-17: 5000 [IU] via SUBCUTANEOUS
  Filled 2011-12-17: qty 1

## 2011-12-17 MED ORDER — HYDROMORPHONE HCL PF 1 MG/ML IJ SOLN: 0.2500 mg | INTRAMUSCULAR | Status: DC | PRN

## 2011-12-17 MED ORDER — SUFENTANIL CITRATE 50 MCG/ML IV SOLN
INTRAVENOUS | Status: DC | PRN
  Administered 2011-12-17 (×3): 10 ug via INTRAVENOUS
  Administered 2011-12-17: 20 ug via INTRAVENOUS

## 2011-12-17 MED ORDER — VITAMINS A & D EX OINT
TOPICAL_OINTMENT | CUTANEOUS | Status: AC
  Administered 2011-12-17: 1
  Filled 2011-12-17: qty 5

## 2011-12-17 MED ORDER — DEXTROSE 5 % IV SOLN
2.0000 g | INTRAVENOUS | Status: AC
  Administered 2011-12-17: 2 g via INTRAVENOUS
  Filled 2011-12-17: qty 2

## 2011-12-17 MED ORDER — 0.9 % SODIUM CHLORIDE (POUR BTL) OPTIME
TOPICAL | Status: DC | PRN
  Administered 2011-12-17: 1000 mL

## 2011-12-17 MED ORDER — BUPIVACAINE LIPOSOME 1.3 % IJ SUSP
20.0000 mL | Freq: Once | INTRAMUSCULAR | Status: AC
  Administered 2011-12-17: 20 mL
  Filled 2011-12-17: qty 20

## 2011-12-17 MED ORDER — ACETAMINOPHEN 10 MG/ML IV SOLN
INTRAVENOUS | Status: AC
  Filled 2011-12-17: qty 100

## 2011-12-17 MED ORDER — DEXAMETHASONE SODIUM PHOSPHATE 10 MG/ML IJ SOLN
INTRAMUSCULAR | Status: DC | PRN
  Administered 2011-12-17: 10 mg via INTRAVENOUS

## 2011-12-17 MED ORDER — DIPHENHYDRAMINE HCL 50 MG/ML IJ SOLN: 12.5000 mg | Freq: Four times a day (QID) | INTRAMUSCULAR | Status: DC | PRN

## 2011-12-17 MED ORDER — FENTANYL CITRATE 0.05 MG/ML IJ SOLN
INTRAMUSCULAR | Status: DC | PRN
  Administered 2011-12-17 (×2): 50 ug via INTRAVENOUS

## 2011-12-17 MED ORDER — NEOSTIGMINE METHYLSULFATE 1 MG/ML IJ SOLN
INTRAMUSCULAR | Status: DC | PRN
  Administered 2011-12-17: 4 mg via INTRAVENOUS

## 2011-12-17 MED ORDER — LIDOCAINE HCL (CARDIAC) 20 MG/ML IV SOLN
INTRAVENOUS | Status: DC | PRN
  Administered 2011-12-17: 100 mg via INTRAVENOUS

## 2011-12-17 MED ORDER — KCL IN DEXTROSE-NACL 20-5-0.45 MEQ/L-%-% IV SOLN
INTRAVENOUS | Status: AC
  Filled 2011-12-17: qty 1000

## 2011-12-17 MED ORDER — KCL IN DEXTROSE-NACL 20-5-0.45 MEQ/L-%-% IV SOLN
INTRAVENOUS | Status: DC
  Administered 2011-12-17 – 2011-12-18 (×2): via INTRAVENOUS
  Administered 2011-12-18: 100 mL/h via INTRAVENOUS
  Administered 2011-12-19 (×2): via INTRAVENOUS
  Filled 2011-12-17 (×7): qty 1000

## 2011-12-17 MED ORDER — HEPARIN SODIUM (PORCINE) 5000 UNIT/ML IJ SOLN
5000.0000 [IU] | Freq: Three times a day (TID) | INTRAMUSCULAR | Status: DC
  Administered 2011-12-17 – 2011-12-20 (×8): 5000 [IU] via SUBCUTANEOUS
  Filled 2011-12-17 (×11): qty 1

## 2011-12-17 MED ORDER — CHLORHEXIDINE GLUCONATE 4 % EX LIQD
1.0000 "application " | Freq: Once | CUTANEOUS | Status: DC
  Filled 2011-12-17: qty 15

## 2011-12-17 MED ORDER — CISATRACURIUM BESYLATE (PF) 10 MG/5ML IV SOLN
INTRAVENOUS | Status: DC | PRN
  Administered 2011-12-17: 3 mg via INTRAVENOUS
  Administered 2011-12-17: 10 mg via INTRAVENOUS
  Administered 2011-12-17: 2 mg via INTRAVENOUS

## 2011-12-17 MED ORDER — GLYCOPYRROLATE 0.2 MG/ML IJ SOLN
INTRAMUSCULAR | Status: DC | PRN
  Administered 2011-12-17: 0.6 mg via INTRAVENOUS

## 2011-12-17 MED ORDER — LACTATED RINGERS IR SOLN
Status: DC | PRN
  Administered 2011-12-17: 1000 mL

## 2011-12-17 MED ORDER — SUCCINYLCHOLINE CHLORIDE 20 MG/ML IJ SOLN
INTRAMUSCULAR | Status: DC | PRN
  Administered 2011-12-17: 80 mg via INTRAVENOUS

## 2011-12-17 MED ORDER — LACTATED RINGERS IV SOLN
INTRAVENOUS | Status: DC
  Administered 2011-12-17: 13:00:00 via INTRAVENOUS
  Administered 2011-12-17: 1000 mL via INTRAVENOUS

## 2011-12-17 SURGICAL SUPPLY — 45 items
ADH SKN CLS APL DERMABOND .7 (GAUZE/BANDAGES/DRESSINGS) ×2
APL SKNCLS STERI-STRIP NONHPOA (GAUZE/BANDAGES/DRESSINGS) ×2
APPLIER CLIP 5 13 M/L LIGAMAX5 (MISCELLANEOUS)
APR CLP MED LRG 5 ANG JAW (MISCELLANEOUS)
BENZOIN TINCTURE PRP APPL 2/3 (GAUZE/BANDAGES/DRESSINGS) ×3 IMPLANT
BLADE HEX COATED 2.75 (ELECTRODE) ×1 IMPLANT
CANISTER SUCTION 2500CC (MISCELLANEOUS) ×3 IMPLANT
CANNULA ENDOPATH XCEL 11M (ENDOMECHANICALS) ×7 IMPLANT
CLAMP ENDO BABCK 10MM (STAPLE) ×4 IMPLANT
CLIP APPLIE 5 13 M/L LIGAMAX5 (MISCELLANEOUS) IMPLANT
CLOTH BEACON ORANGE TIMEOUT ST (SAFETY) ×3 IMPLANT
COVER SURGICAL LIGHT HANDLE (MISCELLANEOUS) ×3 IMPLANT
DECANTER SPIKE VIAL GLASS SM (MISCELLANEOUS) ×2 IMPLANT
DERMABOND ADVANCED (GAUZE/BANDAGES/DRESSINGS) ×1
DERMABOND ADVANCED .7 DNX12 (GAUZE/BANDAGES/DRESSINGS) IMPLANT
DEVICE SUTURE ENDOST 10MM (ENDOMECHANICALS) ×1 IMPLANT
DISSECTOR BLUNT TIP ENDO 5MM (MISCELLANEOUS) ×3 IMPLANT
DRAIN PENROSE 18X1/2 LTX STRL (DRAIN) ×3 IMPLANT
DRAPE LAPAROSCOPIC ABDOMINAL (DRAPES) ×3 IMPLANT
ELECT REM PT RETURN 9FT ADLT (ELECTROSURGICAL) ×3
ELECTRODE REM PT RTRN 9FT ADLT (ELECTROSURGICAL) ×2 IMPLANT
GLOVE BIOGEL M 8.0 STRL (GLOVE) ×3 IMPLANT
GLOVE BIOGEL PI IND STRL 7.0 (GLOVE) ×2 IMPLANT
GLOVE BIOGEL PI INDICATOR 7.0 (GLOVE) ×1
GOWN STRL NON-REIN LRG LVL3 (GOWN DISPOSABLE) ×3 IMPLANT
GOWN STRL REIN XL XLG (GOWN DISPOSABLE) ×6 IMPLANT
HAND ACTIVATED (MISCELLANEOUS) ×3 IMPLANT
KIT BASIN OR (CUSTOM PROCEDURE TRAY) ×3 IMPLANT
NS IRRIG 1000ML POUR BTL (IV SOLUTION) ×3 IMPLANT
PENCIL BUTTON HOLSTER BLD 10FT (ELECTRODE) IMPLANT
SCISSORS LAP 5X35 DISP (ENDOMECHANICALS) ×3 IMPLANT
SET IRRIG TUBING LAPAROSCOPIC (IRRIGATION / IRRIGATOR) ×3 IMPLANT
SLEEVE XCEL OPT CAN 5 100 (ENDOMECHANICALS) ×3 IMPLANT
SOLUTION ANTI FOG 6CC (MISCELLANEOUS) ×3 IMPLANT
SUT ETHIBOND 2 0 SH (SUTURE)
SUT ETHIBOND 2 0 SH 36X2 (SUTURE) IMPLANT
SUT SURGIDAC NAB ES-9 0 48 120 (SUTURE) ×1 IMPLANT
SUT VIC AB 4-0 SH 18 (SUTURE) ×3 IMPLANT
SYR 30ML LL (SYRINGE) ×3 IMPLANT
TOWEL OR 17X26 10 PK STRL BLUE (TOWEL DISPOSABLE) ×3 IMPLANT
TRAY FOLEY CATH 14FRSI W/METER (CATHETERS) ×3 IMPLANT
TRAY LAP CHOLE (CUSTOM PROCEDURE TRAY) ×3 IMPLANT
TROCAR BLADELESS OPT 5 75 (ENDOMECHANICALS) ×3 IMPLANT
TROCAR XCEL NON-BLD 11X100MML (ENDOMECHANICALS) ×4 IMPLANT
TUBING FILTER THERMOFLATOR (ELECTROSURGICAL) ×3 IMPLANT

## 2011-12-17 NOTE — Anesthesia Preprocedure Evaluation (Addendum)
Anesthesia Evaluation  Patient identified by MRN, date of birth, ID band Patient awake    Reviewed: Allergy & Precautions, H&P , NPO status , Patient's Chart, lab work & pertinent test results  Airway Mallampati: II TM Distance: <3 FB Neck ROM: Full    Dental No notable dental hx. (+) Dental Advisory Given   Pulmonary neg pulmonary ROS,  breath sounds clear to auscultation  Pulmonary exam normal       Cardiovascular negative cardio ROS  Rhythm:Regular Rate:Normal     Neuro/Psych negative neurological ROS  negative psych ROS   GI/Hepatic Neg liver ROS, GERD-  Medicated and Poorly Controlled,  Endo/Other  negative endocrine ROS  Renal/GU negative Renal ROS  negative genitourinary   Musculoskeletal negative musculoskeletal ROS (+)   Abdominal   Peds negative pediatric ROS (+)  Hematology negative hematology ROS (+)   Anesthesia Other Findings   Reproductive/Obstetrics negative OB ROS                          Anesthesia Physical Anesthesia Plan  ASA: II  Anesthesia Plan: General   Post-op Pain Management:    Induction: Intravenous, Rapid sequence and Cricoid pressure planned  Airway Management Planned: Oral ETT  Additional Equipment:   Intra-op Plan:   Post-operative Plan: Extubation in OR  Informed Consent: I have reviewed the patients History and Physical, chart, labs and discussed the procedure including the risks, benefits and alternatives for the proposed anesthesia with the patient or authorized representative who has indicated his/her understanding and acceptance.   Dental advisory given  Plan Discussed with: CRNA and Surgeon  Anesthesia Plan Comments:         Anesthesia Quick Evaluation

## 2011-12-17 NOTE — Anesthesia Procedure Notes (Addendum)
Performed by: Florene Route   Procedure Name: Intubation Date/Time: 12/17/2011 1:06 PM Performed by: Leroy Libman L Patient Re-evaluated:Patient Re-evaluated prior to inductionOxygen Delivery Method: Circle system utilized Preoxygenation: Pre-oxygenation with 100% oxygen Intubation Type: IV induction, Rapid sequence and Cricoid Pressure applied Laryngoscope Size: Miller and 3 Grade View: Grade III Tube type: Oral Tube size: 8.0 mm Number of attempts: 1 Airway Equipment and Method: Bougie stylet Placement Confirmation: ETT inserted through vocal cords under direct vision,  breath sounds checked- equal and bilateral and positive ETCO2 Secured at: 22 cm Tube secured with: Tape Dental Injury: Teeth and Oropharynx as per pre-operative assessment  Difficulty Due To: Difficulty was anticipated and Difficult Airway- due to anterior larynx Future Recommendations: Recommend- induction with short-acting agent, and alternative techniques readily available

## 2011-12-17 NOTE — Interval H&P Note (Signed)
History and Physical Interval Note:  12/17/2011 12:20 PM  [name]  has presented today for surgery, with the diagnosis of achalsia  The various methods of treatment have been discussed with the patient and family. After consideration of risks, benefits and other options for treatment, the patient has consented to  Procedure(s) (LRB) with comments: LAPAROSCOPIC HELLER MYOTOMY (N/A) as a surgical intervention .  The patient's history has been reviewed, patient examined, no change in status, stable for surgery.  I have reviewed the patient's chart and labs.  Questions were answered to the patient's satisfaction.     Brianda Beitler B

## 2011-12-17 NOTE — Transfer of Care (Signed)
Immediate Anesthesia Transfer of Care Note  Patient: [name]  Procedure(s) Performed: Procedure(s) (LRB) with comments: LAPAROSCOPIC HELLER MYOTOMY (N/A) UPPER GI ENDOSCOPY (N/A)  Patient Location: PACU  Anesthesia Type:General  Level of Consciousness: awake and patient cooperative  Airway & Oxygen Therapy: Patient Spontanous Breathing and Patient connected to face mask oxygen  Post-op Assessment: Report given to PACU RN and Post -op Vital signs reviewed and stable  Post vital signs: Reviewed and stable  Complications: No apparent anesthesia complications

## 2011-12-17 NOTE — Progress Notes (Signed)
Dr. Rose in- made aware of patient's blood pressures. 

## 2011-12-17 NOTE — Anesthesia Postprocedure Evaluation (Signed)
  Anesthesia Post-op Note  Patient: [name]  Procedure(s) Performed: Procedure(s) (LRB): LAPAROSCOPIC HELLER MYOTOMY (N/A) UPPER GI ENDOSCOPY (N/A)  Patient Location: PACU  Anesthesia Type: General  Level of Consciousness: awake and alert   Airway and Oxygen Therapy: Patient Spontanous Breathing  Post-op Pain: mild  Post-op Assessment: Post-op Vital signs reviewed, Patient's Cardiovascular Status Stable, Respiratory Function Stable, Patent Airway and No signs of Nausea or vomiting  Post-op Vital Signs: stable  Complications: No apparent anesthesia complications

## 2011-12-17 NOTE — Brief Op Note (Signed)
12/17/2011  3:57 PM  PATIENT:  Lavonda Jumbo  78 y.o. male  PRE-OPERATIVE DIAGNOSIS:  achalsia  POST-OPERATIVE DIAGNOSIS:  ACHALSIA  PROCEDURE:  Procedure(s) (LRB) with comments: LAPAROSCOPIC HELLER MYOTOMY (N/A) UPPER GI ENDOSCOPY (N/A)  SURGEON:  Surgeon(s) and Role:    * Kandis Cocking, MD - Assisting    * Valarie Merino, MD - Primary  PHYSICIAN ASSISTANT:   ASSISTANTS: Ovidio Kin, MD, FACS   ANESTHESIA:   general  EBL:  Total I/O In: 1700 [I.V.:1700] Out: 250 [Urine:200; Blood:50]  BLOOD ADMINISTERED:none  DRAINS: none   LOCAL MEDICATIONS USED:  OTHER Exparel 20 cc  SPECIMEN:  No Specimen  DISPOSITION OF SPECIMEN:  N/A  COUNTS:  YES  TOURNIQUET:  * No tourniquets in log *  DICTATION: .Other Dictation: Dictation Number [id number]  PLAN OF CARE: Admit to inpatient   PATIENT DISPOSITION:  PACU - hemodynamically stable.   Delay start of Pharmacological VTE agent (>24hrs) due to surgical blood loss or risk of bleeding: no

## 2011-12-17 NOTE — H&P (View-Only) (Signed)
Chief Complaint:  achalasia  History of Present Illness:  [name] is an 78 y.o. male who has been seen by Dr. Vilinda Boehringer with achalasia. He's had this for some time and he scattered worsening symptoms. I saw him with his wife and he has gotten worse to where he is spitting up now and wants to have something done. I discussed Botox therapy with him as to Dr. Randa Evens that he would like to go ahead and think have the surgery. He does not appear his stated age his he looks to be about early 47s. He works out daily at [employer] and manages to stay fit.  I described our myotomy with him and also discussed a new endoscopic myotomy with him as well. I discussed the complications of perforation and the risk is aware that. However like for Dr. Randa Evens to endoscope him upper days preop and lavages patulous esophagus. I did review his upper GI series after Dr. Randa Evens discussed them with me last week.  He wants to get this set up and we'll set up at [facility] his convenience.  No past medical history on file.  Past Surgical History  Procedure Date  . Joint replacement 2007    rt hip   . Hernia repair 1952    Current Outpatient Prescriptions  Medication Sig Dispense Refill  . aspirin 81 MG tablet Take 325 mg by mouth daily. 1/2 aspirin qd      . calcium-vitamin D (OSCAL WITH D) 500-200 MG-UNIT per tablet Take 1 tablet by mouth daily.      . fish oil-omega-3 fatty acids 1000 MG capsule Take 2 g by mouth daily.      . multivitamin-lutein (OCUVITE-LUTEIN) CAPS Take 1 capsule by mouth daily.       Review of patient's allergies indicates no known allergies. Family History  Problem Relation Age of Onset  . Heart disease Mother   . Heart disease Father    Social History:   reports that he has quit smoking. He does not have any smokeless tobacco history on file. His alcohol and drug histories not on file.   REVIEW OF SYSTEMS - PERTINENT POSITIVES ONLY: Neg for DVT  Physical Exam:     Blood pressure 138/88, pulse 70, temperature 98 F (36.7 C), temperature source Oral, resp. rate 18, height 5\' 8"  (1.727 m), weight 195 lb 6.4 oz (88.633 kg). Body mass index is 29.71 kg/(m^2).  Gen:  WDWN WM NAD  Neurological: Alert and oriented to person, place, and time. Motor and sensory function is grossly intact  Head: Normocephalic and atraumatic.  Eyes: Conjunctivae are normal. Pupils are equal, round, and reactive to light. No scleral icterus.  Neck: Normal range of motion. Neck supple. No tracheal deviation or thyromegaly present.  Cardiovascular:  SR without murmurs or gallops.  No carotid bruits Respiratory: Effort normal.  No respiratory distress. No chest wall tenderness. Breath sounds normal.  No wheezes, rales or rhonchi.  Abdomen:  Flat and nontender GU: Musculoskeletal: Normal range of motion. Extremities are nontender. No cyanosis, edema or clubbing noted Lymphadenopathy: No cervical, preauricular, postauricular or axillary adenopathy is present Skin: Skin is warm and dry. No rash noted. No diaphoresis. No erythema. No pallor. Pscyh: Normal mood and affect. Behavior is normal. Judgment and thought content normal.   LABORATORY RESULTS: No results found for this or any previous visit (from the past 48 hour(s)).  RADIOLOGY RESULTS: No results found.  Problem List: There is no problem list  on file for this patient.   Assessment & Plan: Achalasia Lap Heller myotomy    Matt B. Daphine Deutscher, MD, [facility] [phone] beeper [phone]  11/28/2011 12:41 PM

## 2011-12-17 NOTE — Preoperative (Signed)
Beta Blockers   Reason not to administer Beta Blockers:Not Applicable 

## 2011-12-18 ENCOUNTER — Encounter (HOSPITAL_COMMUNITY): Payer: Self-pay | Admitting: Surgery

## 2011-12-18 ENCOUNTER — Inpatient Hospital Stay (HOSPITAL_COMMUNITY): Payer: Medicare Other

## 2011-12-18 LAB — CBC
HCT: 39.3 % (ref 39.0–52.0)
Hemoglobin: 13.6 g/dL (ref 13.0–17.0)
MCHC: 34.6 g/dL (ref 30.0–36.0)
RBC: 4.68 MIL/uL (ref 4.22–5.81)
WBC: 13.9 10*3/uL — ABNORMAL HIGH (ref 4.0–10.5)

## 2011-12-18 LAB — BASIC METABOLIC PANEL
BUN: 18 mg/dL (ref 6–23)
Chloride: 99 mEq/L (ref 96–112)
GFR calc non Af Amer: 58 mL/min — ABNORMAL LOW (ref >90)
Glucose, Bld: 192 mg/dL — ABNORMAL HIGH (ref 70–99)
Potassium: 3.8 mEq/L (ref 3.5–5.1)
Sodium: 132 mEq/L — ABNORMAL LOW (ref 135–145)

## 2011-12-18 LAB — GLUCOSE, CAPILLARY
Glucose-Capillary: 169 mg/dL — ABNORMAL HIGH (ref 70–99)
Glucose-Capillary: 116 mg/dL — ABNORMAL HIGH (ref 70–99)
Glucose-Capillary: 122 mg/dL — ABNORMAL HIGH (ref 70–99)
Glucose-Capillary: 121 mg/dL — ABNORMAL HIGH (ref 70–99)

## 2011-12-18 MED ORDER — INSULIN ASPART 100 UNIT/ML ~~LOC~~ SOLN
0.0000 [IU] | SUBCUTANEOUS | Status: DC
  Administered 2011-12-18: 2 [IU] via SUBCUTANEOUS
  Administered 2011-12-18: 3 [IU] via SUBCUTANEOUS
  Administered 2011-12-18: 2 [IU] via SUBCUTANEOUS
  Administered 2011-12-18: 3 [IU] via SUBCUTANEOUS
  Administered 2011-12-18 – 2011-12-19 (×2): 2 [IU] via SUBCUTANEOUS
  Administered 2011-12-19: 3 [IU] via SUBCUTANEOUS
  Administered 2011-12-19 – 2011-12-20 (×3): 2 [IU] via SUBCUTANEOUS

## 2011-12-18 NOTE — Progress Notes (Signed)
CARE MANAGEMENT NOTE 12/18/2011  Patient:  [name],[name]   Account Number:  [id number]  Date Initiated:  12/18/2011  Documentation initiated by:  Cherree Conerly  Subjective/Objective Assessment:   pt is s/p Laparoscopic foregut dissection with esophagomyotomy,posterior closure of the hiatus and upper endoscopy  bp problems noted in the recovery room and pt transfered to sdu due to high risk of vagal complications     Action/Plan:   from home   Anticipated DC Date:  12/21/2011   Anticipated DC Plan:  HOME/SELF CARE  In-house referral  NA      DC Planning Services  NA      [facility] Choice  NA   Choice offered to / List presented to:  NA   DME arranged  NA      DME agency  NA     HH arranged  NA      HH agency  NA   Status of service:  In process, will continue to follow Medicare Important Message given?  NA - LOS <3 / Initial given by admissions (If response is "NO", the following Medicare IM given date fields will be blank) Date Medicare IM given:   Date Additional Medicare IM given:    Discharge Disposition:    Per UR Regulation:  Reviewed for med. necessity/level of care/duration of stay  If discussed at Long Length of Stay Meetings, dates discussed:    Comments:  [id number]/Aleeta Schmaltz Earlene Plater, RN, BSN, CCM: CHART REVIEWED AND UPDATED. NO DISCHARGE NEEDS PRESENT AT THIS TIME. CASE MANAGEMENT [phone]

## 2011-12-18 NOTE — Op Note (Signed)
NAMEHARRINGTON, [name]                ACCOUNT NO.:  [id number]  MEDICAL RECORD NO.:  [id number]  LOCATION:  1224                         FACILITY:  [facility]  PHYSICIAN:  Sandria Bales. Ezzard Standing, M.D.  DATE OF BIRTH:  1922-03-28  DATE OF PROCEDURE:  12/17/2011                              OPERATIVE REPORT   PREOPERATIVE DIAGNOSIS:  Achalasia, status post laparoscopic Heller myotomy.  POSTOPERATIVE DIAGNOSIS:  Achalasia, status post laparoscopic Heller myotomy.  PROCEDURE:  Esophagogastroscopy.  SURGEON:  Sandria Bales. Ezzard Standing, M.D.  FIRST ASSISTANT:  None.  ANESTHESIA:  General endotracheal.  INDICATION FOR PROCEDURE:  [name] is an 78 year old white male who has achalasia.  Dr. Luretha Murphy has done a laparoscopic Heller myotomy.  I am doing the upper endoscopy, to examine the adequacy of the myotomy and the length.  OPERATIVE NOTE:  The patient was in room #11, already under general anesthesia for the laparoscopic Heller myotomy.   I did the endoscopic portion of the patient.  Dr. Daphine Deutscher was managing the laparoscope.  I passed an Olympus endoscope down the back of the throat into the esophagus.  The patient does have a tight upper esophageal sphincter.  The esophagus itself had contractions, it was not overly dilated.  I identified the esophagogastric junction at 40 cm.  It looked like the myotomy, went about 4-5 cm above, the esophagogastric junction at about 35-36 cm, and it looked like the myotomy went about 1 cm beyond the esophagogastric junction towards the stomach.  Dr. Daphine Deutscher was also able to evaluate the EG junction from outside.  There was no evidence of any mucosal injury.  The stomach itself was unremarkable.  I did not advance the scope into the duodenum.  I then withdrew the scope.  Dr. Daphine Deutscher also inflated the upper abdomen with saline, showed no bubbles or evidence of leak.  I withdrew the scope.  The patient tolerated the procedure well, transferred to the recovery room in good  condition.  Dr. Daphine Deutscher will dictate the remainder of the operation.   Sandria Bales. Ezzard Standing, M.D., FACS   DHN/MEDQ  D:  12/17/2011  T:  12/18/2011  Job:  [id number]  cc:   [place] Daphine Deutscher, MD [address], [address] [place] [address]

## 2011-12-18 NOTE — Op Note (Signed)
NAMECHADWIN, FURY                ACCOUNT NO.:  [id number]  MEDICAL RECORD NO.:  [id number]  LOCATION:  1224                         FACILITY:  [facility]  PHYSICIAN:  Thornton Park. Daphine Deutscher, MD  DATE OF BIRTH:  04-21-1922  DATE OF PROCEDURE: DATE OF DISCHARGE:                              OPERATIVE REPORT   PREOPERATIVE DIAGNOSIS:  Achalasia.  POSTOPERATIVE DIAGNOSIS:  Achalasia with hiatal hernia.  PROCEDURE:  Laparoscopic foregut dissection with esophagomyotomy, posterior closure of the hiatus and upper endoscopy by Dr. Ezzard Standing.  SURGEON:  Thornton Park. Daphine Deutscher, MD.  ASSISTANTMarland Kitchen  Sandria Bales. Ezzard Standing, M.D.  DESCRIPTION OF PROCEDURE:  The patient was taken to OR #11 at [facility] on December 17, 2011, and given general anesthesia.  Abdomen was prepped with chloroxylenol and draped sterilely.  Access to the abdomen was achieved through the left upper quadrant using a 5-mm Optiview without difficulty.  A 5-mm upper midline was used to retract the left lateral segment and then three other trocars were placed, one of which was subsequently upgraded to 11/12 trocar.  Foregut was dissected using a Harmonic scalpel, mobilizing the right crus first corn across the anterior esophagus and coming down the left crus.  I then went around behind and put a Penrose drain around and we held it at length.  I freed it up and identified those structures, they were demanding to this dissection.  I also took down the short gastrics with Harmonic scalpel.  I used the hook Bovie and Dr. Ezzard Standing and I would provided traction and counter traction, and first divided the longitudinal fibers and distal esophagus and then went down to identify the circular fibers and teased away through those using a combination of blunt dissection and electrocautery, and also some scissors to open up these fibers and get him to pull back.  In the end, we had significantly freed the esophagus for a distance of about 6 cm going down onto the  stomach and then through the crown of Helvetius. Dr. Ezzard Standing then passed the endoscope and we paid particular attention to the mucosa, which looked viable throughout.  There were no evidence of any bubbles or leaks.  As we went in through the dilated  myotomy distal esophagus, we could look all the way into the stomach and there was no evidence of a stricture tightness.  The esophagus and the stomach were inspected as the scope was  withdrawn.  I repaired the hiatus with a single posterior suture using Endo Stitch, doing of intracorporeal tie.  Everything looked good and I elected not to do a Dor fundoplication.  The patient tolerated the procedure well and was taken to recovery room in satisfactory condition.     Thornton Park Daphine Deutscher, MD     MBM/MEDQ  D:  12/17/2011  T:  12/18/2011  Job:  [id number]  cc:   Fayrene Fearing L. Malon Kindle., M.D. Fax: [phone]

## 2011-12-18 NOTE — Progress Notes (Signed)
Patient ID: [name], male   DOB: 1922-11-26, 78 y.o.   MRN: [id number] [facility] Surgery Progress Note:   1 Day Post-Op  Subjective: Mental status is clear.  Seen on way back from UGI Objective: Vital signs in last 24 hours: Temp:  [97.4 F (36.3 C)-98.5 F (36.9 C)] 97.4 F (36.3 C) (10/30 0800) Pulse Rate:  [64-96] 71  (10/30 0800) Resp:  [7-20] 17  (10/30 0800) BP: (140-196)/(66-92) 147/66 mmHg (10/30 0800) SpO2:  [96 %-100 %] 97 % (10/30 0800) Weight:  [189 lb 6 oz (85.9 kg)-197 lb 8.5 oz (89.6 kg)] 189 lb 6 oz (85.9 kg) (10/30 0015)  Intake/Output from previous day: 10/29 0701 - 10/30 0700 In: 3276.7 [I.V.:3266.7; IV Piggyback:10] Out: 2020 [Urine:1970; Blood:50] Intake/Output this shift: Total I/O In: 200 [I.V.:200] Out: 245 [Urine:245]  Physical Exam: Work of breathing is normal.  VSS.    Lab Results:  Results for orders placed during the hospital encounter of 12/17/11 (from the past 48 hour(s))  GLUCOSE, CAPILLARY     Status: Abnormal   Collection Time   12/17/11 11:11 AM      Component Value Range Comment   Glucose-Capillary 120 (*) 70 - 99 mg/dL   GLUCOSE, CAPILLARY     Status: Abnormal   Collection Time   12/17/11  4:07 PM      Component Value Range Comment   Glucose-Capillary 160 (*) 70 - 99 mg/dL    Comment 1 Documented in Chart      Comment 2 Notify RN     CBC     Status: Abnormal   Collection Time   12/17/11  7:28 PM      Component Value Range Comment   WBC 13.2 (*) 4.0 - 10.5 K/uL    RBC 5.01  4.22 - 5.81 MIL/uL    Hemoglobin 14.9  13.0 - 17.0 g/dL    HCT 40.9  81.1 - 91.4 %    MCV 84.6  78.0 - 100.0 fL    MCH 29.7  26.0 - 34.0 pg    MCHC 35.1  30.0 - 36.0 g/dL    RDW 78.2  95.6 - 21.3 %    Platelets 188  150 - 400 K/uL   CREATININE, SERUM     Status: Abnormal   Collection Time   12/17/11  7:28 PM      Component Value Range Comment   Creatinine, Ser 1.15  0.50 - 1.35 mg/dL    GFR calc non Af Amer 55 (*) >90 mL/min    GFR calc Af  Amer 64 (*) >90 mL/min   GLUCOSE, CAPILLARY     Status: Abnormal   Collection Time   12/18/11 12:17 AM      Component Value Range Comment   Glucose-Capillary 183 (*) 70 - 99 mg/dL   CBC     Status: Abnormal   Collection Time   12/18/11  2:45 AM      Component Value Range Comment   WBC 13.9 (*) 4.0 - 10.5 K/uL    RBC 4.68  4.22 - 5.81 MIL/uL    Hemoglobin 13.6  13.0 - 17.0 g/dL    HCT 08.6  57.8 - 46.9 %    MCV 84.0  78.0 - 100.0 fL    MCH 29.1  26.0 - 34.0 pg    MCHC 34.6  30.0 - 36.0 g/dL    RDW 62.9  52.8 - 41.3 %    Platelets 173  150 - 400 K/uL  BASIC METABOLIC PANEL     Status: Abnormal   Collection Time   12/18/11  2:45 AM      Component Value Range Comment   Sodium 132 (*) 135 - 145 mEq/L    Potassium 3.8  3.5 - 5.1 mEq/L    Chloride 99  96 - 112 mEq/L    CO2 26  19 - 32 mEq/L    Glucose, Bld 192 (*) 70 - 99 mg/dL    BUN 18  6 - 23 mg/dL    Creatinine, Ser 9.60  0.50 - 1.35 mg/dL    Calcium 8.8  8.4 - 45.4 mg/dL    GFR calc non Af Amer 58 (*) >90 mL/min    GFR calc Af Amer 68 (*) >90 mL/min   GLUCOSE, CAPILLARY     Status: Abnormal   Collection Time   12/18/11  4:20 AM      Component Value Range Comment   Glucose-Capillary 169 (*) 70 - 99 mg/dL   GLUCOSE, CAPILLARY     Status: Abnormal   Collection Time   12/18/11  8:28 AM      Component Value Range Comment   Glucose-Capillary 138 (*) 70 - 99 mg/dL    Comment 1 Documented in Chart      Comment 2 Notify RN       Radiology/Results: Dg Ugi W/water Sol Cm  12/18/2011  *RADIOLOGY REPORT*  Clinical Data: Post distal esophageal myotomy for achalasia.  ESOPHOGRAM/BARIUM SWALLOW  Technique:  Single contrast examination was performed using water- soluble.  Fluoroscopy time:  2.9 minutes.  Comparison:  02/16/2010  Findings:  Scout view reveals a 2.6 cm gallstone.  Left renal calculi versus overlying vascular calcifications.  Prior right hip replacement.  Left hip joint degenerative changes.  Degenerative changes lumbar  spine with atherosclerotic type changes of the aorta.  Distal esophagus with narrowing but without obstruction to the flow at the gastroesophageal junction.  On the lateral view, possibility of leak was questioned however this appears to represent atelectasis rather than leak.  Overall, no leak of the distal esophagus is noted.  Outpouching of the distal esophagus may represent the level of the myotomy.  Free intraperitoneal air.  This is consistent with the patient's recent postoperative state.  If there are any progressive symptoms indicating perforated viscus then further imaging with CT would be necessary.  Narrowing of the distal esophagus consistent with postoperative edema and achalasia.  Very poor primary esophageal stripping wave with significant achalasia throughout the entire esophagus.  IMPRESSION: On the lateral view, possibility of leak was questioned however this appears to represent atelectasis rather than leak.  Overall, no leak of the distal esophagus is noted.  Outpouching of the distal esophagus may represent the level of the myotomy.  Narrowing of the distal esophagus consistent with postoperative edema and achalasia.  Free intraperitoneal air.  This is consistent with the patient's recent postoperative state.  If there are any progressive symptoms indicating perforated viscus then further imaging with CT would be necessary.  Very poor primary esophageal stripping wave with significant achalasia throughout the entire esophagus.   Original Report Authenticated By: Fuller Canada, M.D.     Anti-infectives: Anti-infectives     Start     Dose/Rate Route Frequency Ordered Stop   12/17/11 1100   cefOXitin (MEFOXIN) 2 g in dextrose 5 % 50 mL IVPB        2 g 100 mL/hr over 30 Minutes Intravenous On call to O.R. 12/17/11  1040 12/17/11 1308          Assessment/Plan: Problem List: Patient Active Problem List  Diagnosis  . Achalasia    UGI reviewed.  No leak noted.  Will start water  by mouth.   1 Day Post-Op    LOS: 1 day   Matt B. Daphine Deutscher, MD, [facility] [phone] beeper [phone]  12/18/2011 12:09 PM

## 2011-12-19 LAB — GLUCOSE, CAPILLARY
Glucose-Capillary: 119 mg/dL — ABNORMAL HIGH (ref 70–99)
Glucose-Capillary: 125 mg/dL — ABNORMAL HIGH (ref 70–99)
Glucose-Capillary: 126 mg/dL — ABNORMAL HIGH (ref 70–99)
Glucose-Capillary: 98 mg/dL (ref 70–99)
Glucose-Capillary: 99 mg/dL (ref 70–99)

## 2011-12-19 MED ORDER — ACETAMINOPHEN 325 MG PO TABS
650.0000 mg | ORAL_TABLET | Freq: Four times a day (QID) | ORAL | Status: DC | PRN
  Administered 2011-12-19 – 2011-12-20 (×3): 650 mg via ORAL
  Filled 2011-12-19 (×3): qty 2

## 2011-12-19 NOTE — Progress Notes (Signed)
Patient ID: [name], male   DOB: 12/03/1922, 78 y.o.   MRN: [id number] [facility] Progress Note:   2 Days Post-Op  Subjective: Mental status is clear Objective: Vital signs in last 24 hours: Temp:  [97.4 F (36.3 C)-99.3 F (37.4 C)] 97.5 F (36.4 C) (10/31 0800) Pulse Rate:  [62-69] 66  (10/31 0800) Resp:  [15-21] 15  (10/31 0800) BP: (90-173)/(14-74) 173/72 mmHg (10/31 0800) SpO2:  [94 %-96 %] 96 % (10/31 0800) Weight:  [201 lb 15.1 oz (91.6 kg)] 201 lb 15.1 oz (91.6 kg) (10/31 0600)  Intake/Output from previous day: 10/30 0701 - 10/31 0700 In: 2017.1 [I.V.:2007.1; IV Piggyback:10] Out: 2065 [Urine:2065] Intake/Output this shift: Total I/O In: 150 [I.V.:150] Out: -   Physical Exam: Work of breathing is normal.  No abdominal pain complaints.  Tolerating water.    Lab Results:  Results for orders placed during the hospital encounter of 12/17/11 (from the past 48 hour(s))  GLUCOSE, CAPILLARY     Status: Abnormal   Collection Time   12/17/11 11:11 AM      Component Value Range Comment   Glucose-Capillary 120 (*) 70 - 99 mg/dL   GLUCOSE, CAPILLARY     Status: Abnormal   Collection Time   12/17/11  4:07 PM      Component Value Range Comment   Glucose-Capillary 160 (*) 70 - 99 mg/dL    Comment 1 Documented in Chart      Comment 2 Notify RN     CBC     Status: Abnormal   Collection Time   12/17/11  7:28 PM      Component Value Range Comment   WBC 13.2 (*) 4.0 - 10.5 K/uL    RBC 5.01  4.22 - 5.81 MIL/uL    Hemoglobin 14.9  13.0 - 17.0 g/dL    HCT 40.9  81.1 - 91.4 %    MCV 84.6  78.0 - 100.0 fL    MCH 29.7  26.0 - 34.0 pg    MCHC 35.1  30.0 - 36.0 g/dL    RDW 78.2  95.6 - 21.3 %    Platelets 188  150 - 400 K/uL   CREATININE, SERUM     Status: Abnormal   Collection Time   12/17/11  7:28 PM      Component Value Range Comment   Creatinine, Ser 1.15  0.50 - 1.35 mg/dL    GFR calc non Af Amer 55 (*) >90 mL/min    GFR calc Af Amer 64 (*) >90 mL/min     GLUCOSE, CAPILLARY     Status: Abnormal   Collection Time   12/18/11 12:17 AM      Component Value Range Comment   Glucose-Capillary 183 (*) 70 - 99 mg/dL   CBC     Status: Abnormal   Collection Time   12/18/11  2:45 AM      Component Value Range Comment   WBC 13.9 (*) 4.0 - 10.5 K/uL    RBC 4.68  4.22 - 5.81 MIL/uL    Hemoglobin 13.6  13.0 - 17.0 g/dL    HCT 08.6  57.8 - 46.9 %    MCV 84.0  78.0 - 100.0 fL    MCH 29.1  26.0 - 34.0 pg    MCHC 34.6  30.0 - 36.0 g/dL    RDW 62.9  52.8 - 41.3 %    Platelets 173  150 - 400 K/uL   BASIC METABOLIC PANEL  Status: Abnormal   Collection Time   12/18/11  2:45 AM      Component Value Range Comment   Sodium 132 (*) 135 - 145 mEq/L    Potassium 3.8  3.5 - 5.1 mEq/L    Chloride 99  96 - 112 mEq/L    CO2 26  19 - 32 mEq/L    Glucose, Bld 192 (*) 70 - 99 mg/dL    BUN 18  6 - 23 mg/dL    Creatinine, Ser 1.61  0.50 - 1.35 mg/dL    Calcium 8.8  8.4 - 09.6 mg/dL    GFR calc non Af Amer 58 (*) >90 mL/min    GFR calc Af Amer 68 (*) >90 mL/min   GLUCOSE, CAPILLARY     Status: Abnormal   Collection Time   12/18/11  4:20 AM      Component Value Range Comment   Glucose-Capillary 169 (*) 70 - 99 mg/dL   GLUCOSE, CAPILLARY     Status: Abnormal   Collection Time   12/18/11  8:28 AM      Component Value Range Comment   Glucose-Capillary 138 (*) 70 - 99 mg/dL    Comment 1 Documented in Chart      Comment 2 Notify RN     GLUCOSE, CAPILLARY     Status: Abnormal   Collection Time   12/18/11 12:52 PM      Component Value Range Comment   Glucose-Capillary 120 (*) 70 - 99 mg/dL    Comment 1 Documented in Chart      Comment 2 Notify RN     GLUCOSE, CAPILLARY     Status: Abnormal   Collection Time   12/18/11  4:14 PM      Component Value Range Comment   Glucose-Capillary 116 (*) 70 - 99 mg/dL   GLUCOSE, CAPILLARY     Status: Abnormal   Collection Time   12/18/11  8:31 PM      Component Value Range Comment   Glucose-Capillary 122 (*) 70 - 99  mg/dL   GLUCOSE, CAPILLARY     Status: Abnormal   Collection Time   12/18/11 11:29 PM      Component Value Range Comment   Glucose-Capillary 121 (*) 70 - 99 mg/dL   GLUCOSE, CAPILLARY     Status: Abnormal   Collection Time   12/19/11  3:25 AM      Component Value Range Comment   Glucose-Capillary 126 (*) 70 - 99 mg/dL   GLUCOSE, CAPILLARY     Status: Normal   Collection Time   12/19/11  7:36 AM      Component Value Range Comment   Glucose-Capillary 98  70 - 99 mg/dL    Comment 1 Documented in Chart      Comment 2 Notify RN       Radiology/Results: Dg Ugi W/water Sol Cm  12/18/2011  **ADDENDUM** CREATED: 12/18/2011 12:13:20  This examination was ordered as a water-soluble upper GI series with KUB.  The exam was tailored to evaluate the distal esophagus.  Narrowing at the gastroesophageal junction most likely represents postoperative edema.  Remainder of visualized stomach unremarkable.  **END ADDENDUM** SIGNED BY: Almedia Balls. Constance Goltz, M.D.   12/18/2011  *RADIOLOGY REPORT*  Clinical Data: Post distal esophageal myotomy for achalasia.  ESOPHOGRAM/BARIUM SWALLOW  Technique:  Single contrast examination was performed using water- soluble.  Fluoroscopy time:  2.9 minutes.  Comparison:  02/16/2010  Findings:  Scout view reveals a 2.6 cm gallstone.  Left renal calculi versus overlying vascular calcifications.  Prior right hip replacement.  Left hip joint degenerative changes.  Degenerative changes lumbar spine with atherosclerotic type changes of the aorta.  Distal esophagus with narrowing but without obstruction to the flow at the gastroesophageal junction.  On the lateral view, possibility of leak was questioned however this appears to represent atelectasis rather than leak.  Overall, no leak of the distal esophagus is noted.  Outpouching of the distal esophagus may represent the level of the myotomy.  Free intraperitoneal air.  This is consistent with the patient's recent postoperative state.  If  there are any progressive symptoms indicating perforated viscus then further imaging with CT would be necessary.  Narrowing of the distal esophagus consistent with postoperative edema and achalasia.  Very poor primary esophageal stripping wave with significant achalasia throughout the entire esophagus.  IMPRESSION: On the lateral view, possibility of leak was questioned however this appears to represent atelectasis rather than leak.  Overall, no leak of the distal esophagus is noted.  Outpouching of the distal esophagus may represent the level of the myotomy.  Narrowing of the distal esophagus consistent with postoperative edema and achalasia.  Free intraperitoneal air.  This is consistent with the patient's recent postoperative state.  If there are any progressive symptoms indicating perforated viscus then further imaging with CT would be necessary.  Very poor primary esophageal stripping wave with significant achalasia throughout the entire esophagus.   Original Report Authenticated By: Fuller Canada, M.D.     Anti-infectives: Anti-infectives     Start     Dose/Rate Route Frequency Ordered Stop   12/17/11 1100   cefOXitin (MEFOXIN) 2 g in dextrose 5 % 50 mL IVPB        2 g 100 mL/hr over 30 Minutes Intravenous On call to O.R. 12/17/11 1040 12/17/11 1308          Assessment/Plan: Problem List: Patient Active Problem List  Diagnosis  . Achalasia    Begin clear liquids.  Transfer to floor  2 Days Post-Op    LOS: 2 days   Matt B. Daphine Deutscher, MD, [facility] [phone] beeper [phone]  12/19/2011 9:25 AM

## 2011-12-20 LAB — CBC WITH DIFFERENTIAL/PLATELET
Basophils Absolute: 0 10*3/uL (ref 0.0–0.1)
Basophils Relative: 0 % (ref 0–1)
Eosinophils Absolute: 0.3 10*3/uL (ref 0.0–0.7)
MCHC: 34.3 g/dL (ref 30.0–36.0)
Neutro Abs: 5.3 10*3/uL (ref 1.7–7.7)
Neutrophils Relative %: 63 % (ref 43–77)
RDW: 13.6 % (ref 11.5–15.5)

## 2011-12-20 LAB — GLUCOSE, CAPILLARY: Glucose-Capillary: 131 mg/dL — ABNORMAL HIGH (ref 70–99)

## 2011-12-20 NOTE — Discharge Summary (Signed)
Physician Discharge Summary  Patient ID: [name] MRN: [id number] DOB/AGE: 18-Aug-1922 78 y.o.  Admit date: 12/17/2011 Discharge date: 12/20/2011  Admission Diagnoses:  achalasia  Discharge Diagnoses:  Achalasia with evidence of proximal esophageal spasm   Active Problems:  * No active hospital problems. *    Surgery:  Laparoscopic Heller esophagomyotomy  Discharged Condition: improved  Hospital Course:   Had surgery and UGI on PD 1 that looked good.  Begun on water and then clear liquids.  Ready to go home on full liquids.    Consults: none  Significant Diagnostic Studies: UGI showed no leaks    Discharge Exam: Blood pressure 150/87, pulse 64, temperature 97.9 F (36.6 C), temperature source Oral, resp. rate 18, height 5' 8.5" (1.74 m), weight 201 lb 15.1 oz (91.6 kg), SpO2 95.00%. Incisions covered with Dermabond.  Abdomen nontender.  Passing flatus and having BMs.   Disposition: 01-Home or Self Care  Discharge Orders    Future Appointments: Provider: Department: Dept Phone: Center:   01/08/2012 9:40 AM Valarie Merino, MD [facility] [phone] None     Future Orders Please Complete By Expires   Diet - low sodium heart healthy      Scheduling Instructions:   Liquids for 1 week   Increase activity slowly      Driving Restrictions      Comments:   May drive if not taking any narcotics for pain and feel like you have control of the vehicle   No wound care      Call MD for:  persistant nausea and vomiting      Call MD for:  severe uncontrolled pain      Discharge instructions      Comments:   Tylenol for pain.       Medication List     As of 12/20/2011  8:44 AM    TAKE these medications         aspirin 325 MG tablet   Take 162.5 mg by mouth daily.      cholecalciferol 1000 UNITS tablet   Commonly known as: VITAMIN D   Take 1,000 Units by mouth daily.      fish oil-omega-3 fatty acids 1000 MG capsule   Take 2 g by mouth daily.      MIRALAX  powder   Generic drug: polyethylene glycol powder   Take 17 g by mouth daily. ONCE DAILY IN AM  1 CAPFUL IN 8 OZ LIQUID      multivitamin-lutein Caps   Take 1 capsule by mouth daily.      omeprazole 20 MG capsule   Commonly known as: PRILOSEC   Take 20 mg by mouth daily. IN AM           Follow-up Information    Follow up with Jerianne Anselmo B, MD. In 4 weeks.   Contact information:   [address] [place] [address] [phone]          Signed: Valarie Merino 12/20/2011, 8:44 AM

## 2011-12-20 NOTE — Care Management Note (Signed)
    Page 1 of 2   12/20/2011     9:58:00 AM   CARE MANAGEMENT NOTE 12/20/2011  Patient:  [name],[name]   Account Number:  [id number]  Date Initiated:  12/18/2011  Documentation initiated by:  DAVIS,RHONDA  Subjective/Objective Assessment:   pt is s/p Laparoscopic foregut dissection with esophagomyotomy,posterior closure of the hiatus and upper endoscopy  bp problems noted in the recovery room and pt transfered to sdu due to high risk of vagal complications     Action/Plan:   from home   Anticipated DC Date:  12/21/2011   Anticipated DC Plan:  HOME/SELF CARE  In-house referral  NA      DC Planning Services  NA      PAC Choice  NA   Choice offered to / List presented to:  NA   DME arranged  NA      DME agency  NA     HH arranged  NA      HH agency  NA   Status of service:  Completed, signed off Medicare Important Message given?  NA - LOS <3 / Initial given by admissions (If response is "NO", the following Medicare IM given date fields will be blank) Date Medicare IM given:   Date Additional Medicare IM given:    Discharge Disposition:  HOME/SELF CARE  Per UR Regulation:  Reviewed for med. necessity/level of care/duration of stay  If discussed at Long Length of Stay Meetings, dates discussed:    Comments:  [id number]/Rhonda Earlene Plater, RN, BSN, CCM: CHART REVIEWED AND UPDATED. NO DISCHARGE NEEDS PRESENT AT THIS TIME. CASE MANAGEMENT [phone]

## 2012-01-08 ENCOUNTER — Encounter (INDEPENDENT_AMBULATORY_CARE_PROVIDER_SITE_OTHER): Payer: Self-pay | Admitting: Surgery

## 2012-01-08 ENCOUNTER — Ambulatory Visit (INDEPENDENT_AMBULATORY_CARE_PROVIDER_SITE_OTHER): Payer: Medicare Other | Admitting: Surgery

## 2012-01-08 VITALS — BP 146/84 | HR 68 | Temp 97.4°F | Resp 14 | Ht 70.5 in | Wt 195.4 lb

## 2012-01-08 DIAGNOSIS — K220 Achalasia of cardia: Secondary | ICD-10-CM

## 2012-01-08 NOTE — Progress Notes (Signed)
[name] 78 y.o.  Body mass index is 27.64 kg/(m^2).  Patient Active Problem List  Diagnosis  . Achalasia    No Known Allergies  Past Surgical History  Procedure Date  . Joint replacement 2007    rt hip   . Hernia repair 1952  . Heller myotomy 12/17/2011    Procedure: LAPAROSCOPIC HELLER MYOTOMY;  Surgeon: Valarie Merino, MD;  Location: [facility];  Service: General;  Laterality: N/A;  . Upper gi endoscopy 12/17/2011    Procedure: UPPER GI ENDOSCOPY;  Surgeon: Valarie Merino, MD;  Location: [facility];  Service: General;  Laterality: N/A;   Ginette Otto, MD No diagnosis found.  Doing very well after laparoscopic Heller myotomy.  Incisions healed.  He is eating ad lib.  He is not having any reflux.  Will see him back in 3 months.   Matt B. Daphine Deutscher, MD, [facility] [phone] beeper [phone]  01/08/2012 9:40 AM

## 2012-01-08 NOTE — Patient Instructions (Signed)
Laparoscopic Heller's Myotomy and Anti-Reflux Procedure A laparoscopic Heller's myotomy and anti-reflux procedure is used for treating achalasia. Achalasia is an uncommon condition of the esophagus. The esophagus is the food tube that carries food from your mouth to your stomach. In this condition, the lower esophageal sphincter (the small muscular layer around the bottom of the esophagus) does not relax. The sphincter is the muscle that keeps the food in the stomach. Also in this condition, the esophagus does not contract in the normal way. This means it does not move the food along normally from the mouth to the stomach.  PROCEDURE  Laparoscopic means that the procedure is done using a laparoscope. A laparoscope is a thin, lighted, pencil-sized tube. It is like a telescope. Once you are anesthetized, your surgeon fills your belly (abdomen) with a harmless gas (carbon dioxide). This makes your organs easier to see. The laparoscope is inserted into your abdomen through a small incision (port). This allows your surgeon to see into the abdomen. Many surgeons attach a video camera to the laparoscope to enlarge the view. Other small instruments are also inserted into the abdomen through other small openings (ports). The ports allow the surgeon to perform the operation.  The surgeon locates the area of the esophagus to operate. He/she cuts a length of the esophageal muscle. Cutting this length of muscle relaxes the tightened muscle that does not allow food to go down. The surgeon makes sure not to injure the inner lining of the esophagus. Sometimes a fundoplication is done in addition to the laparoscopy. This is a procedure where the top part of the stomach is wrapped gently around the lower part of the esophagus. It is stitched (sutured) in place to create a valve that prevents food from going back up into the esophagus after eating. This helps make the connecting channel between the esophagus and the stomach work  more normally.  After the procedure, the gas is released from your abdomen. Your incisions are closed with sutures. These incisions are small (usually less than one-half inch). So there is often minimal discomfort following the procedure. The recovery time is shortened as long as there are no complications. You will rest in a recovery roomuntil you are stable and doing well. If there are no complications, you will usually be allowed to go home. RISKS AND COMPLICATIONS Some problems that may occur following this procedure include:  Infection: A germ starts growing in the wound. This can usually be treated with medicine that kills bacteria(antibiotics).  Bleeding following surgery is a possible complication of almost all surgeries. It is usually minimal following this surgery.  Sometimes during this procedure, a hole is accidentally made in the esophagus. This is a complication that usually must be repaired immediately. Document Released: 10/30/2000 Document Revised: 04/29/2011 Document Reviewed: 03/06/2007 [facility] Patient Information 2013 [place], [place].

## 2012-05-07 ENCOUNTER — Encounter (INDEPENDENT_AMBULATORY_CARE_PROVIDER_SITE_OTHER): Payer: Self-pay | Admitting: Surgery

## 2012-05-07 ENCOUNTER — Ambulatory Visit (INDEPENDENT_AMBULATORY_CARE_PROVIDER_SITE_OTHER): Payer: Medicare Other | Admitting: Surgery

## 2012-05-07 VITALS — BP 162/68 | HR 76 | Temp 97.3°F | Resp 16 | Ht 70.5 in | Wt 197.0 lb

## 2012-05-07 DIAGNOSIS — Z9889 Other specified postprocedural states: Secondary | ICD-10-CM

## 2012-05-07 NOTE — Progress Notes (Signed)
[name] 79 y.o.  Body mass index is 27.86 kg/(m^2).  Patient Active Problem List  Diagnosis  . Achalasia    No Known Allergies  Past Surgical History  Procedure Laterality Date  . Joint replacement  2007    rt hip   . Hernia repair  1952  . Heller myotomy  12/17/2011    Procedure: LAPAROSCOPIC HELLER MYOTOMY;  Surgeon: Valarie Merino, MD;  Location: [facility];  Service: General;  Laterality: N/A;  . Upper gi endoscopy  12/17/2011    Procedure: UPPER GI ENDOSCOPY;  Surgeon: Valarie Merino, MD;  Location: [facility];  Service: General;  Laterality: N/A;   Ginette Otto, MD No diagnosis found.  [name] is doing very well. He has only had one or 2 episodes of dysphasia. He is remarkably better since he's had a Heller myotomy. He seems very pleased and I've encouraged him to continue being active in his lifestyle. I will be happy to see him again for never needed. Matt B. Daphine Deutscher, MD, [facility] [phone] beeper [phone]  05/07/2012 11:16 AM

## 2012-05-07 NOTE — Patient Instructions (Addendum)
Thanks for your patience.  If you need further assistance after leaving the office, please call our office and speak with a [facility] nurse.  [phone].  If you want to leave a message for Dr. Avaree Gilberti, please call his office phone at [phone]. 

## 2012-07-28 ENCOUNTER — Other Ambulatory Visit: Payer: Self-pay | Admitting: Geriatric Medicine

## 2012-07-28 DIAGNOSIS — R4701 Aphasia: Secondary | ICD-10-CM

## 2012-07-31 ENCOUNTER — Ambulatory Visit
Admission: RE | Admit: 2012-07-31 | Discharge: 2012-07-31 | Disposition: A | Discharge status: Home / Self Care | Payer: Medicare Other | Source: Ambulatory Visit | Attending: Geriatric Medicine | Admitting: Geriatric Medicine

## 2012-07-31 DIAGNOSIS — R4701 Aphasia: Secondary | ICD-10-CM

## 2012-11-27 ENCOUNTER — Other Ambulatory Visit: Payer: Self-pay | Admitting: Dermatology

## 2013-01-26 ENCOUNTER — Other Ambulatory Visit: Payer: Self-pay | Admitting: Geriatric Medicine

## 2013-01-26 ENCOUNTER — Ambulatory Visit
Admission: RE | Admit: 2013-01-26 | Discharge: 2013-01-26 | Disposition: A | Discharge status: Home / Self Care | Payer: Medicare Other | Source: Ambulatory Visit | Attending: Geriatric Medicine | Admitting: Geriatric Medicine

## 2013-01-26 DIAGNOSIS — M546 Pain in thoracic spine: Secondary | ICD-10-CM

## 2013-09-13 ENCOUNTER — Other Ambulatory Visit: Payer: Self-pay | Admitting: Internal Medicine

## 2013-09-13 ENCOUNTER — Ambulatory Visit
Admission: RE | Admit: 2013-09-13 | Discharge: 2013-09-13 | Disposition: A | Discharge status: Home / Self Care | Payer: Medicare Other | Source: Ambulatory Visit | Attending: Internal Medicine | Admitting: Internal Medicine

## 2013-09-13 DIAGNOSIS — M545 Low back pain: Secondary | ICD-10-CM

## 2014-10-21 ENCOUNTER — Emergency Department (HOSPITAL_COMMUNITY): Payer: Medicare Other

## 2014-10-21 ENCOUNTER — Inpatient Hospital Stay (HOSPITAL_COMMUNITY)
Admission: EM | Admit: 2014-10-21 | Discharge: 2014-10-27 | DRG: 247 | Disposition: A | Discharge status: Home / Self Care | Payer: Medicare Other | Attending: Cardiovascular Disease | Admitting: Cardiovascular Disease

## 2014-10-21 ENCOUNTER — Encounter (HOSPITAL_COMMUNITY): Payer: Self-pay | Admitting: *Deleted

## 2014-10-21 DIAGNOSIS — I129 Hypertensive chronic kidney disease with stage 1 through stage 4 chronic kidney disease, or unspecified chronic kidney disease: Secondary | ICD-10-CM | POA: Diagnosis present

## 2014-10-21 DIAGNOSIS — R931 Abnormal findings on diagnostic imaging of heart and coronary circulation: Secondary | ICD-10-CM | POA: Diagnosis not present

## 2014-10-21 DIAGNOSIS — I447 Left bundle-branch block, unspecified: Secondary | ICD-10-CM

## 2014-10-21 DIAGNOSIS — E1122 Type 2 diabetes mellitus with diabetic chronic kidney disease: Secondary | ICD-10-CM | POA: Diagnosis not present

## 2014-10-21 DIAGNOSIS — M47814 Spondylosis without myelopathy or radiculopathy, thoracic region: Secondary | ICD-10-CM | POA: Diagnosis present

## 2014-10-21 DIAGNOSIS — I25110 Atherosclerotic heart disease of native coronary artery with unstable angina pectoris: Principal | ICD-10-CM | POA: Diagnosis present

## 2014-10-21 DIAGNOSIS — Z9861 Coronary angioplasty status: Secondary | ICD-10-CM

## 2014-10-21 DIAGNOSIS — R011 Cardiac murmur, unspecified: Secondary | ICD-10-CM | POA: Diagnosis not present

## 2014-10-21 DIAGNOSIS — Z96641 Presence of right artificial hip joint: Secondary | ICD-10-CM | POA: Diagnosis present

## 2014-10-21 DIAGNOSIS — I200 Unstable angina: Secondary | ICD-10-CM | POA: Diagnosis not present

## 2014-10-21 DIAGNOSIS — N183 Chronic kidney disease, stage 3 unspecified: Secondary | ICD-10-CM | POA: Diagnosis present

## 2014-10-21 DIAGNOSIS — I209 Angina pectoris, unspecified: Secondary | ICD-10-CM | POA: Diagnosis present

## 2014-10-21 DIAGNOSIS — I2510 Atherosclerotic heart disease of native coronary artery without angina pectoris: Secondary | ICD-10-CM | POA: Diagnosis not present

## 2014-10-21 DIAGNOSIS — E119 Type 2 diabetes mellitus without complications: Secondary | ICD-10-CM | POA: Diagnosis present

## 2014-10-21 DIAGNOSIS — I249 Acute ischemic heart disease, unspecified: Secondary | ICD-10-CM | POA: Diagnosis not present

## 2014-10-21 DIAGNOSIS — R0989 Other specified symptoms and signs involving the circulatory and respiratory systems: Secondary | ICD-10-CM | POA: Diagnosis present

## 2014-10-21 DIAGNOSIS — R079 Chest pain, unspecified: Secondary | ICD-10-CM

## 2014-10-21 DIAGNOSIS — Z955 Presence of coronary angioplasty implant and graft: Secondary | ICD-10-CM

## 2014-10-21 DIAGNOSIS — Z87891 Personal history of nicotine dependence: Secondary | ICD-10-CM

## 2014-10-21 DIAGNOSIS — M479 Spondylosis, unspecified: Secondary | ICD-10-CM | POA: Diagnosis present

## 2014-10-21 DIAGNOSIS — I440 Atrioventricular block, first degree: Secondary | ICD-10-CM | POA: Diagnosis not present

## 2014-10-21 DIAGNOSIS — Z79899 Other long term (current) drug therapy: Secondary | ICD-10-CM

## 2014-10-21 DIAGNOSIS — I10 Essential (primary) hypertension: Secondary | ICD-10-CM | POA: Diagnosis present

## 2014-10-21 DIAGNOSIS — Z7982 Long term (current) use of aspirin: Secondary | ICD-10-CM

## 2014-10-21 DIAGNOSIS — K219 Gastro-esophageal reflux disease without esophagitis: Secondary | ICD-10-CM | POA: Diagnosis present

## 2014-10-21 HISTORY — DX: Essential (primary) hypertension: I10

## 2014-10-21 LAB — I-STAT TROPONIN, ED: Troponin i, poc: 0.02 ng/mL (ref 0.00–0.08)

## 2014-10-21 LAB — BASIC METABOLIC PANEL
Anion gap: 6 (ref 5–15)
BUN: 29 mg/dL — AB (ref 6–20)
CHLORIDE: 108 mmol/L (ref 101–111)
CO2: 26 mmol/L (ref 22–32)
Calcium: 9.4 mg/dL (ref 8.9–10.3)
Creatinine, Ser: 1.87 mg/dL — ABNORMAL HIGH (ref 0.61–1.24)
GFR calc Af Amer: 35 mL/min — ABNORMAL LOW (ref >60)
GFR calc non Af Amer: 30 mL/min — ABNORMAL LOW (ref >60)
Glucose, Bld: 124 mg/dL — ABNORMAL HIGH (ref 65–99)
POTASSIUM: 4.5 mmol/L (ref 3.5–5.1)
SODIUM: 140 mmol/L (ref 135–145)

## 2014-10-21 LAB — CBC WITH DIFFERENTIAL/PLATELET
Basophils Absolute: 0 10*3/uL (ref 0.0–0.1)
Basophils Relative: 0 % (ref 0–1)
Eosinophils Absolute: 0.3 10*3/uL (ref 0.0–0.7)
Eosinophils Relative: 4 % (ref 0–5)
HEMATOCRIT: 36.8 % — AB (ref 39.0–52.0)
HEMOGLOBIN: 12 g/dL — AB (ref 13.0–17.0)
LYMPHS ABS: 1.9 10*3/uL (ref 0.7–4.0)
LYMPHS PCT: 24 % (ref 12–46)
MCH: 29.3 pg (ref 26.0–34.0)
MCHC: 32.6 g/dL (ref 30.0–36.0)
MCV: 89.8 fL (ref 78.0–100.0)
MONO ABS: 0.6 10*3/uL (ref 0.1–1.0)
MONOS PCT: 8 % (ref 3–12)
NEUTROS ABS: 4.8 10*3/uL (ref 1.7–7.7)
NEUTROS PCT: 64 % (ref 43–77)
Platelets: 179 10*3/uL (ref 150–400)
RBC: 4.1 MIL/uL — ABNORMAL LOW (ref 4.22–5.81)
RDW: 13.9 % (ref 11.5–15.5)
WBC: 7.6 10*3/uL (ref 4.0–10.5)

## 2014-10-21 LAB — TROPONIN I: TROPONIN I: 0.03 ng/mL (ref <0.031)

## 2014-10-21 LAB — I-STAT CHEM 8, ED
BUN: 30 mg/dL — AB (ref 6–20)
CREATININE: 1.9 mg/dL — AB (ref 0.61–1.24)
Calcium, Ion: 1.21 mmol/L (ref 1.13–1.30)
Chloride: 106 mmol/L (ref 101–111)
GLUCOSE: 119 mg/dL — AB (ref 65–99)
HEMATOCRIT: 36 % — AB (ref 39.0–52.0)
Hemoglobin: 12.2 g/dL — ABNORMAL LOW (ref 13.0–17.0)
Potassium: 4.1 mmol/L (ref 3.5–5.1)
Sodium: 141 mmol/L (ref 135–145)
TCO2: 21 mmol/L (ref 0–100)

## 2014-10-21 MED ORDER — OMEGA-3-ACID ETHYL ESTERS 1 G PO CAPS
1.0000 g | ORAL_CAPSULE | Freq: Every day | ORAL | Status: DC
  Administered 2014-10-22 – 2014-10-27 (×6): 1 g via ORAL
  Filled 2014-10-21 (×10): qty 1

## 2014-10-21 MED ORDER — HEPARIN SODIUM (PORCINE) 5000 UNIT/ML IJ SOLN
5000.0000 [IU] | Freq: Three times a day (TID) | INTRAMUSCULAR | Status: DC
  Administered 2014-10-22 – 2014-10-24 (×8): 5000 [IU] via SUBCUTANEOUS
  Filled 2014-10-21 (×8): qty 1

## 2014-10-21 MED ORDER — ONDANSETRON HCL 4 MG/2ML IJ SOLN
4.0000 mg | Freq: Four times a day (QID) | INTRAMUSCULAR | Status: DC | PRN
  Administered 2014-10-26: 4 mg via INTRAVENOUS
  Filled 2014-10-21: qty 2

## 2014-10-21 MED ORDER — ACETAMINOPHEN 325 MG PO TABS: 650.0000 mg | ORAL_TABLET | ORAL | Status: DC | PRN

## 2014-10-21 MED ORDER — NITROGLYCERIN 0.4 MG SL SUBL: 0.4000 mg | SUBLINGUAL_TABLET | SUBLINGUAL | Status: DC | PRN

## 2014-10-21 MED ORDER — AMLODIPINE BESYLATE 5 MG PO TABS
5.0000 mg | ORAL_TABLET | Freq: Every day | ORAL | Status: DC
  Administered 2014-10-22 – 2014-10-27 (×6): 5 mg via ORAL
  Filled 2014-10-21 (×7): qty 1

## 2014-10-21 MED ORDER — LISINOPRIL 5 MG PO TABS
5.0000 mg | ORAL_TABLET | Freq: Every day | ORAL | Status: DC
  Administered 2014-10-22: 5 mg via ORAL
  Filled 2014-10-21: qty 1

## 2014-10-21 MED ORDER — ASPIRIN EC 81 MG PO TBEC: 81.0000 mg | DELAYED_RELEASE_TABLET | Freq: Every day | ORAL | Status: DC

## 2014-10-21 MED ORDER — DOCUSATE SODIUM 100 MG PO CAPS
100.0000 mg | ORAL_CAPSULE | Freq: Every day | ORAL | Status: DC
  Administered 2014-10-22 – 2014-10-27 (×5): 100 mg via ORAL
  Filled 2014-10-21 (×5): qty 1

## 2014-10-21 MED ORDER — ASPIRIN EC 81 MG PO TBEC
81.0000 mg | DELAYED_RELEASE_TABLET | Freq: Every day | ORAL | Status: DC
  Administered 2014-10-22 – 2014-10-27 (×4): 81 mg via ORAL
  Filled 2014-10-21 (×5): qty 1

## 2014-10-21 MED ORDER — VITAMIN D 1000 UNITS PO TABS
1000.0000 [IU] | ORAL_TABLET | Freq: Every day | ORAL | Status: DC
  Administered 2014-10-22 – 2014-10-27 (×6): 1000 [IU] via ORAL
  Filled 2014-10-21 (×6): qty 1

## 2014-10-21 NOTE — ED Notes (Signed)
Pt's wife fell and while getting her ready for transport to hospital, he felt tighness in upper left chest and arm pain and SOB pain 7/10.  Denies LOC.  EMS vitals 142/76, 91, 19 99%.  324 aspirin, 20 l hand.  Pt reports no pain or SOB now at hospital.

## 2014-10-21 NOTE — H&P (Signed)
Patient ID: [name] MRN: [id number], DOB/AGE: Jun 25, 1922   Admit date: 10/21/2014   Primary Physician: Ginette Otto, MD Primary Cardiologist: New   HPI:   [name] is a 81 y.o. male with a history of DM, HTN and GERD, otherwise healthy male  and  who presented to [facility] ED for evaluation of chest pain.   Patient was in usual health of state up until this afternoon when he developed CP.  The patient was out with his friends at lunch. He notified that his wife fell today after coming from her hair appointment. He run about 1-1/2 blocks to reach her and then he developed L upper chest tightness that radiated to Left jaw and L shoulder. Recently he has been having L neck pain with exertion. No nausea, diaphoresis, LE edema, orthopnea, PND. Currently he is pain free.   In ED, his Cr is 1.90 with BUN of 30. POc trop of 0.02. CXr showed no acute cardiopulmonary disease;  progressive T11 fracture is age indeterminate: stable T4 fracture; mild diffuse interstitial coarsening is stable. EKG with NSR, incomplete LBBB and PR interval of . Non specific ST wave abnormality when compared to previous EKG.    Problem List  Past Medical History  Diagnosis Date  . Diabetes mellitus without complication     DIET AND WEIGHT CONTROL  . GERD (gastroesophageal reflux disease)     Past Surgical History  Procedure Laterality Date  . Joint replacement  2007    rt hip   . Hernia repair  1952  . Heller myotomy  12/17/2011    Procedure: LAPAROSCOPIC HELLER MYOTOMY;  Surgeon: Valarie Merino, MD;  Location: [facility];  Service: General;  Laterality: N/A;  . Upper gi endoscopy  12/17/2011    Procedure: UPPER GI ENDOSCOPY;  Surgeon: Valarie Merino, MD;  Location: [facility];  Service: General;  Laterality: N/A;     Allergies  No Known Allergies   Home Medications  Prior to Admission medications   Medication Sig Start Date End Date Taking? Authorizing Provider  amLODipine  (NORVASC) 5 MG tablet Take 5 mg by mouth daily.   Yes Historical Provider, MD  aspirin 81 MG tablet Take 81 mg by mouth daily.   Yes Historical Provider, MD  cholecalciferol (VITAMIN D) 1000 UNITS tablet Take 1,000 Units by mouth daily.   Yes Historical Provider, MD  docusate sodium (COLACE) 100 MG capsule Take 100 mg by mouth daily.   Yes Historical Provider, MD  fish oil-omega-3 fatty acids 1000 MG capsule Take 1 g by mouth daily.    Yes Historical Provider, MD  hydroxypropyl methylcellulose / hypromellose (ISOPTO TEARS / GONIOVISC) 2.5 % ophthalmic solution Place 1 drop into both eyes daily as needed for dry eyes.   Yes Historical Provider, MD  lisinopril (PRINIVIL,ZESTRIL) 5 MG tablet Take 5 mg by mouth daily.   Yes Historical Provider, MD  multivitamin-lutein (OCUVITE-LUTEIN) CAPS Take 1 capsule by mouth daily.   Yes Historical Provider, MD    Family History  Family History  Problem Relation Age of Onset  . Heart disease Mother   . Heart disease Father    Family Status  Relation Status Death Age  . Mother Deceased   . Father Deceased      Social History  Social History   Social History  . Marital Status: Married    Spouse Name: N/A  . Number of Children: N/A  . Years of Education: N/A  Occupational History  . Not on file.   Social History Main Topics  . Smoking status: Former Games developer  . Smokeless tobacco: Not on file     Comment: stopped 35yrs ago  . Alcohol Use: No  . Drug Use: No  . Sexual Activity: Not on file   Other Topics Concern  . Not on file   Social History Narrative     All other systems reviewed and are otherwise negative except as noted above.  Physical Exam  Blood pressure 151/70, pulse 83, temperature 98.6 F (37 C), temperature source Oral, resp. rate 15, height 5\' 10"  (1.778 m), weight 177 lb 12.8 oz (80.65 kg), SpO2 97 %.  General: Pleasant, NAD Psych: Normal affect. Neuro: Alert and oriented X 3. Moves all extremities  spontaneously. HEENT: Normal  Neck: Supple without bruits or JVD. Systolic murmur radiated to R carotid.  Lungs:  Resp regular and unlabored, CTA. Heart: RRR no s3, s4. 2-3/6 SEM murmurs. Abdomen: Soft, non-tender, non-distended, BS + x 4.  Extremities: No clubbing, cyanosis or edema. DP/PT/Radials 2+ and equal bilaterally.  Labs  No results for input(s): CKTOTAL, CKMB, TROPONINI in the last 72 hours. Lab Results  Component Value Date   WBC 7.6 10/21/2014   HGB 12.2* 10/21/2014   HCT 36.0* 10/21/2014   MCV 89.8 10/21/2014   PLT 179 10/21/2014    Recent Labs Lab 10/21/14 1634  NA 141  K 4.1  CL 106  BUN 30*  CREATININE 1.90*  GLUCOSE 119*     Radiology/Studies  Dg Chest 2 View  10/21/2014   CLINICAL DATA:  Chest pressure.  Shortness of breath.  EXAM: CHEST - 2 VIEW  COMPARISON:  Two-view chest x-ray 04/28/2012. Thoracic spine radiographs 09/13/2013.  FINDINGS: The heart size is normal. Chronic interstitial coarsening is evident. No focal nodule, mass, or airspace disease is present. Chronic interstitial coarsening is stable. The thoracic compression fracture T4 is stable. The T11 fracture has progressed.  IMPRESSION: 1. Progressive T11 fracture is age indeterminate. 2. Stable T4 fracture. 3. Mild diffuse interstitial coarsening is stable. 4. No acute cardiopulmonary disease.   Electronically Signed   By: Marin Roberts M.D.   On: 10/21/2014 16:38    ECG Date/Time: Friday October 21 2014 15:52:52 EDT Ventricular Rate: 88 PR Interval: 208 QRS Duration: 120 QT Interval: 371 QTC Calculation: 449 R Axis: 69 Text Interpretation: Sinus rhythm Incomplete left bundle branch block  Nonspecific ST abnormality Confirmed by Denton Lank MD, Caryn Bee ([id number]) on  10/21/2014 3:55:40 PM  ASSESSMENT AND PLAN   1. Anginal Pain - His symptoms is concerning for ACS. Given ASA 325mg . Currently chest pain free. EKG with nonspecific ST wave changes with incomplete LBBB and PR interval  of . Troponin x1 negative -Will admit to telemtry for observation -Cycle Troponin and serial ECGs - Myoview in morning, NPO after MN  2. HTN - BP elevated to 161/73 - Continue home regimen  3. Systolic murmur - Will get echo for evaluation.   Signed, Manson Passey, PA-C 10/21/2014, 5:26 PM  I have seen and examined the patient along with Bhagat,Bhavinkumar, PA-C.  I have reviewed the chart, notes and new data.  I agree with PA's note.  Key new complaints: symptoms highly compatible with exertional angina Key examination changes: no clinical HF, systolic murmur appears early peaking Key new findings / data: incomplete LBBB, new; unsure if ST changes are due to conduction abnormality or ischemia  PLAN: Lexiscan Myoview and echo in AM. Note markedly elevated creatinine compared  to 2013 - hydrate overnight and recheck. Avoid beta blockers due to conduction abnormalities.  Thurmon Fair, MD, [facility] [phone] 10/21/2014, 7:55 PM

## 2014-10-21 NOTE — ED Notes (Signed)
Off unit with xray. 

## 2014-10-21 NOTE — ED Provider Notes (Signed)
CSN: [id number]     Arrival date & time 10/21/14  1551 History   First MD Initiated Contact with Patient 10/21/14 1552     Chief Complaint  Patient presents with  . Chest Pain     (Consider location/radiation/quality/duration/timing/severity/associated sxs/prior Treatment) Patient is a 81 y.o. male presenting with chest pain. The history is provided by the patient.  Chest Pain Pain location:  L chest Pain quality: pressure and tightness   Pain radiates to:  L jaw, L shoulder and L arm Pain radiates to the back: no   Pain severity:  Moderate Onset quality:  Sudden Duration:  10 minutes Timing:  Constant Progression:  Resolved Chronicity:  New Context comment:  His wife tells today in the ambulance came to get her. He was out with friends at lunch and to see her before she left he ran 1-1/2 blocks and chest pain started after that Relieved by:  Rest and aspirin Worsened by:  Nothing tried Ineffective treatments:  None tried Associated symptoms: dizziness and shortness of breath   Associated symptoms: no abdominal pain, no cough, no diaphoresis, no lower extremity edema, no nausea and not vomiting   Risk factors: diabetes mellitus, high cholesterol and hypertension   Risk factors: no coronary artery disease, no prior DVT/PE, no smoking and no surgery     Past Medical History  Diagnosis Date  . Diabetes mellitus without complication     DIET AND WEIGHT CONTROL  . GERD (gastroesophageal reflux disease)    Past Surgical History  Procedure Laterality Date  . Joint replacement  2007    rt hip   . Hernia repair  1952  . Heller myotomy  12/17/2011    Procedure: LAPAROSCOPIC HELLER MYOTOMY;  Surgeon: Valarie Merino, MD;  Location: [facility];  Service: General;  Laterality: N/A;  . Upper gi endoscopy  12/17/2011    Procedure: UPPER GI ENDOSCOPY;  Surgeon: Valarie Merino, MD;  Location: [facility];  Service: General;  Laterality: N/A;   Family History  Problem Relation Age of Onset   . Heart disease Mother   . Heart disease Father    Social History  Substance Use Topics  . Smoking status: Former Games developer  . Smokeless tobacco: None     Comment: stopped 58yrs ago  . Alcohol Use: No    Review of Systems  Constitutional: Negative for diaphoresis.  Respiratory: Positive for shortness of breath. Negative for cough.   Cardiovascular: Positive for chest pain.  Gastrointestinal: Negative for nausea, vomiting and abdominal pain.  Neurological: Positive for dizziness.  All other systems reviewed and are negative.     Allergies  Review of patient's allergies indicates no known allergies.  Home Medications   Prior to Admission medications   Medication Sig Start Date End Date Taking? Authorizing Provider  acetaminophen (TYLENOL) 325 MG tablet Take 650 mg by mouth every 6 (six) hours as needed for pain.    Historical Provider, MD  aspirin 325 MG tablet Take 81 mg by mouth daily.     Historical Provider, MD  cholecalciferol (VITAMIN D) 1000 UNITS tablet Take 1,000 Units by mouth daily.    Historical Provider, MD  fish oil-omega-3 fatty acids 1000 MG capsule Take 2 g by mouth daily.    Historical Provider, MD  multivitamin-lutein [facility]) CAPS Take 1 capsule by mouth daily.    Historical Provider, MD  polyethylene glycol powder (MIRALAX) powder Take 17 g by mouth daily. ONCE DAILY IN AM  1 CAPFUL IN 8  OZ LIQUID    Historical Provider, MD   BP 159/71 mmHg  Pulse 86  Temp(Src) 98.6 F (37 C) (Oral)  Resp 15  Ht 5\' 10"  (1.778 m)  Wt 177 lb 12.8 oz (80.65 kg)  BMI 25.51 kg/m2  SpO2 97% Physical Exam  Constitutional: He is oriented to person, place, and time. He appears well-developed and well-nourished. No distress.  HENT:  Head: Normocephalic and atraumatic.  Mouth/Throat: Oropharynx is clear and moist.  Eyes: Conjunctivae and EOM are normal. Pupils are equal, round, and reactive to light.  Neck: Normal range of motion. Neck supple.  Cardiovascular: Normal  rate, regular rhythm and intact distal pulses.   No murmur heard. Pulmonary/Chest: Effort normal and breath sounds normal. No respiratory distress. He has no wheezes. He has no rales.  Abdominal: Soft. He exhibits no distension. There is no tenderness. There is no rebound and no guarding.  Musculoskeletal: Normal range of motion. He exhibits edema. He exhibits no tenderness.  Trace edema in bilateral lower extremities  Neurological: He is alert and oriented to person, place, and time.  Skin: Skin is warm and dry. No rash noted. No erythema.  Psychiatric: He has a normal mood and affect. His behavior is normal.  Nursing note and vitals reviewed.   ED Course  Procedures (including critical care time) Labs Review Labs Reviewed  CBC WITH DIFFERENTIAL/PLATELET - Abnormal; Notable for the following:    RBC 4.10 (*)    Hemoglobin 12.0 (*)    HCT 36.8 (*)    All other components within normal limits  I-STAT CHEM 8, ED - Abnormal; Notable for the following:    BUN 30 (*)    Creatinine, Ser 1.90 (*)    Glucose, Bld 119 (*)    Hemoglobin 12.2 (*)    HCT 36.0 (*)    All other components within normal limits  BASIC METABOLIC PANEL  I-STAT TROPOININ, ED    Imaging Review Dg Chest 2 View  10/21/2014   CLINICAL DATA:  Chest pressure.  Shortness of breath.  EXAM: CHEST - 2 VIEW  COMPARISON:  Two-view chest x-ray 04/28/2012. Thoracic spine radiographs 09/13/2013.  FINDINGS: The heart size is normal. Chronic interstitial coarsening is evident. No focal nodule, mass, or airspace disease is present. Chronic interstitial coarsening is stable. The thoracic compression fracture T4 is stable. The T11 fracture has progressed.  IMPRESSION: 1. Progressive T11 fracture is age indeterminate. 2. Stable T4 fracture. 3. Mild diffuse interstitial coarsening is stable. 4. No acute cardiopulmonary disease.   Electronically Signed   By: Marin Roberts M.D.   On: 10/21/2014 16:38   I have personally reviewed and  evaluated these images and lab results as part of my medical decision-making.   EKG Interpretation   Date/Time:  Friday October 21 2014 15:52:52 EDT Ventricular Rate:  88 PR Interval:  208 QRS Duration: 120 QT Interval:  371 QTC Calculation: 449 R Axis:   69 Text Interpretation:  Sinus rhythm Incomplete left bundle branch block  Nonspecific ST abnormality Confirmed by Denton Lank  MD, Caryn Bee ([id number]) on  10/21/2014 3:55:40 PM      MDM   Final diagnoses:  ACS (acute coronary syndrome)    Pt with symptoms concerning for ACS that occurred at 2pm today.  Heart score of  . Associated symptoms include SOB and radiation of pain in the neck and arm.  Low risk wells. ASA 325mg  given and CP resolved.  Currently symptoms free EKG showed incomplete LBBB which was changed from  3 year ago.  CXR, CBC, BMP, trop pending.  4:55 PM Initial imaging is within normal limits. Initial labs are within normal limits except for a creatinine of 1.9 which is changed from baseline of 1.1. Will discuss patient's story with cardiology  5:20 PM Spoke with Dr. Salena Saner with cardiology who will see and admit the pt.  Gwyneth Sprout, MD 10/21/14 [phone]

## 2014-10-21 NOTE — ED Notes (Signed)
Attempted report 

## 2014-10-22 ENCOUNTER — Encounter (HOSPITAL_COMMUNITY): Payer: Self-pay | Admitting: Cardiology

## 2014-10-22 ENCOUNTER — Observation Stay (HOSPITAL_COMMUNITY): Payer: Medicare Other

## 2014-10-22 DIAGNOSIS — M47814 Spondylosis without myelopathy or radiculopathy, thoracic region: Secondary | ICD-10-CM | POA: Diagnosis present

## 2014-10-22 DIAGNOSIS — I10 Essential (primary) hypertension: Secondary | ICD-10-CM

## 2014-10-22 DIAGNOSIS — I209 Angina pectoris, unspecified: Secondary | ICD-10-CM | POA: Diagnosis not present

## 2014-10-22 DIAGNOSIS — R079 Chest pain, unspecified: Secondary | ICD-10-CM

## 2014-10-22 DIAGNOSIS — N183 Chronic kidney disease, stage 3 unspecified: Secondary | ICD-10-CM | POA: Diagnosis present

## 2014-10-22 HISTORY — DX: Essential (primary) hypertension: I10

## 2014-10-22 LAB — BASIC METABOLIC PANEL
Anion gap: 6 (ref 5–15)
BUN: 26 mg/dL — ABNORMAL HIGH (ref 6–20)
CHLORIDE: 108 mmol/L (ref 101–111)
CO2: 22 mmol/L (ref 22–32)
CREATININE: 1.86 mg/dL — AB (ref 0.61–1.24)
Calcium: 8.8 mg/dL — ABNORMAL LOW (ref 8.9–10.3)
GFR calc non Af Amer: 30 mL/min — ABNORMAL LOW (ref >60)
GFR, EST AFRICAN AMERICAN: 35 mL/min — AB (ref >60)
Glucose, Bld: 111 mg/dL — ABNORMAL HIGH (ref 65–99)
POTASSIUM: 4.2 mmol/L (ref 3.5–5.1)
SODIUM: 136 mmol/L (ref 135–145)

## 2014-10-22 LAB — LIPID PANEL
CHOL/HDL RATIO: 3.8 ratio
CHOLESTEROL: 156 mg/dL (ref 0–200)
HDL: 41 mg/dL (ref >40)
LDL Cholesterol: 90 mg/dL (ref 0–99)
TRIGLYCERIDES: 126 mg/dL (ref <150)
VLDL: 25 mg/dL (ref 0–40)

## 2014-10-22 LAB — TROPONIN I
Troponin I: 0.03 ng/mL (ref <0.031)
Troponin I: <0.03 ng/mL (ref <0.031)

## 2014-10-22 MED ORDER — TECHNETIUM TC 99M SESTAMIBI GENERIC - CARDIOLITE
10.0000 | Freq: Once | INTRAVENOUS | Status: AC | PRN
  Administered 2014-10-22: 10 via INTRAVENOUS

## 2014-10-22 MED ORDER — METOPROLOL TARTRATE 25 MG PO TABS
25.0000 mg | ORAL_TABLET | Freq: Two times a day (BID) | ORAL | Status: DC
  Administered 2014-10-22 – 2014-10-27 (×10): 25 mg via ORAL
  Filled 2014-10-22 (×11): qty 1

## 2014-10-22 MED ORDER — REGADENOSON 0.4 MG/5ML IV SOLN
0.4000 mg | Freq: Once | INTRAVENOUS | Status: AC
  Administered 2014-10-22: 0.4 mg via INTRAVENOUS
  Filled 2014-10-22: qty 5

## 2014-10-22 MED ORDER — REGADENOSON 0.4 MG/5ML IV SOLN
INTRAVENOUS | Status: AC
  Administered 2014-10-22: 0.4 mg via INTRAVENOUS
  Filled 2014-10-22: qty 5

## 2014-10-22 MED ORDER — TECHNETIUM TC 99M SESTAMIBI - CARDIOLITE
30.0000 | Freq: Once | INTRAVENOUS | Status: AC | PRN
  Administered 2014-10-22: 30 via INTRAVENOUS

## 2014-10-22 NOTE — Progress Notes (Signed)
    Subjective:  No chest pain  Objective:  Vital Signs in the last 24 hours: Temp:  [98 F (36.7 C)-98.6 F (37 C)] 98 F (36.7 C) (09/03 0635) Pulse Rate:  [67-92] 84 (09/03 1030) Resp:  [12-20] 16 (09/03 0635) BP: (137-174)/(61-111) 138/75 mmHg (09/03 1030) SpO2:  [92 %-100 %] 100 % (09/03 0635) Weight:  [177 lb 12.8 oz (80.65 kg)-181 lb 3.2 oz (82.192 kg)] 180 lb 12.8 oz (82.01 kg) (09/03 1610)  Intake/Output from previous day:  Intake/Output Summary (Last 24 hours) at 10/22/14 1030 Last data filed at 10/22/14 0647  Gross per 24 hour  Intake      0 ml  Output    375 ml  Net   -375 ml    Physical Exam: General appearance: alert, cooperative and no distress Neck: no JVD Lungs: clear to auscultation bilaterally Heart: regular rate and rhythm Extremities: extremities normal, atraumatic, no cyanosis or edema Skin: Skin color, texture, turgor normal. No rashes or lesions Neurologic: Grossly normal   Rate: 82  Rhythm: normal sinus rhythm  Lab Results:  Recent Labs  10/21/14 1620 10/21/14 1634  WBC 7.6  --   HGB 12.0* 12.2*  PLT 179  --     Recent Labs  10/21/14 1626 10/21/14 1634 10/22/14 0342  NA 140 141 136  K 4.5 4.1 4.2  CL 108 106 108  CO2 26  --  22  GLUCOSE 124* 119* 111*  BUN 29* 30* 26*  CREATININE 1.87* 1.90* 1.86*    Recent Labs  10/21/14 2220 10/22/14 0342  TROPONINI 0.03 0.03   No results for input(s): INR in the last 72 hours.  Scheduled Meds: . amLODipine  5 mg Oral Daily  . aspirin EC  81 mg Oral Daily  . cholecalciferol  1,000 Units Oral Daily  . docusate sodium  100 mg Oral Daily  . heparin  5,000 Units Subcutaneous 3 times per day  . lisinopril  5 mg Oral Daily  . omega-3 acid ethyl esters  1 g Oral Daily   Continuous Infusions:  PRN Meds:.acetaminophen, nitroGLYCERIN, ondansetron (ZOFRAN) IV   Imaging: Dg Chest 2 View  10/21/2014   CLINICAL DATA:  Chest pressure.  Shortness of breath.  EXAM: CHEST - 2 VIEW   COMPARISON:  Two-view chest x-ray 04/28/2012. Thoracic spine radiographs 09/13/2013.  FINDINGS: The heart size is normal. Chronic interstitial coarsening is evident. No focal nodule, mass, or airspace disease is present. Chronic interstitial coarsening is stable. The thoracic compression fracture T4 is stable. The T11 fracture has progressed.  IMPRESSION: 1. Progressive T11 fracture is age indeterminate. 2. Stable T4 fracture. 3. Mild diffuse interstitial coarsening is stable. 4. No acute cardiopulmonary disease.   Electronically Signed   By: Marin Roberts M.D.   On: 10/21/2014 16:38    Cardiac Studies:  Assessment/Plan:   Principal Problem:   Anginal pain Active Problems:   Essential hypertension   Chronic renal disease, stage III   DJD (degenerative joint disease), thoracic   PLAN: Myoview this am  [employer] PA-C 10/22/2014, 10:30 AM [phone]

## 2014-10-22 NOTE — Progress Notes (Signed)
  Echocardiogram 2D Echocardiogram has been performed.  Cathie Beams 10/22/2014, 12:38 PM

## 2014-10-22 NOTE — Progress Notes (Signed)
Subjective: No CP at rest  Breathing is OK   Objective: Filed Vitals:   10/21/14 2045 10/21/14 2100 10/21/14 2131 10/22/14 0635  BP: 167/78 163/78 162/75 153/68  Pulse: 78 69 79 68  Temp:   98.3 F (36.8 C) 98 F (36.7 C)  TempSrc:   Oral Oral  Resp: Height:    (1.778 m)  (1.778 m)  Weight:   181 lb 3.2 oz (82.192 kg) 180 lb 12.8 oz (82.01 kg)  SpO2: 99% 92% 99% 100%   Weight change:   Intake/Output Summary (Last 24 hours) at 10/22/14 0800 Last data filed at 10/22/14 0647  Gross per 24 hour  Intake      0 ml  Output    375 ml  Net   -375 ml    General: Alert, awake, oriented x3, in no acute distress Neck:  JVP is normal Heart: Regular rate and rhythm, without murmurs, rubs, gallops.  Lungs: Clear to auscultation.  No rales or wheezes. Exemities:  No edema.   Neuro: Grossly intact, nonfocal.  Tele:  SR  Lab Results: Results for orders placed or performed during the hospital encounter of 10/21/14 (from the past 24 hour(s))  CBC with Differential/Platelet     Status: Abnormal   Collection Time: 10/21/14  4:20 PM  Result Value Ref Range   WBC 7.6 4.0 - 10.5 K/uL   RBC 4.10 (L) 4.22 - 5.81 MIL/uL   Hemoglobin 12.0 (L) 13.0 - 17.0 g/dL   HCT 16.1 (L) 09.6 - 04.5 %   MCV 89.8 78.0 - 100.0 fL   MCH 29.3 26.0 - 34.0 pg   MCHC 32.6 30.0 - 36.0 g/dL   RDW 40.9 81.1 - 91.4 %   Platelets 179 150 - 400 K/uL   Neutrophils Relative % 64 43 - 77 %   Neutro Abs 4.8 1.7 - 7.7 K/uL   Lymphocytes Relative 24 12 - 46 %   Lymphs Abs 1.9 0.7 - 4.0 K/uL   Monocytes Relative 8 3 - 12 %   Monocytes Absolute 0.6 0.1 - 1.0 K/uL   Eosinophils Relative 4 0 - 5 %   Eosinophils Absolute 0.3 0.0 - 0.7 K/uL   Basophils Relative 0 0 - 1 %   Basophils Absolute 0.0 0.0 - 0.1 K/uL  Basic metabolic panel     Status: Abnormal   Collection Time: 10/21/14  4:26 PM  Result Value Ref Range   Sodium 140 135 - 145 mmol/L   Potassium 4.5 3.5 - 5.1 mmol/L   Chloride 108 101  - 111 mmol/L   CO2 26 22 - 32 mmol/L   Glucose, Bld 124 (H) 65 - 99 mg/dL   BUN 29 (H) 6 - 20 mg/dL   Creatinine, Ser 7.82 (H) 0.61 - 1.24 mg/dL   Calcium 9.4 8.9 - 95.6 mg/dL   GFR calc non Af Amer 30 (L) >60 mL/min   GFR calc Af Amer 35 (L) >60 mL/min   Anion gap 6 5 - 15  I-stat troponin, ED     Status: None   Collection Time: 10/21/14  4:33 PM  Result Value Ref Range   Troponin i, poc 0.02 0.00 - 0.08 ng/mL   Comment 3          I-stat chem 8, ed     Status: Abnormal   Collection Time: 10/21/14  4:34 PM  Result Value Ref Range   Sodium 141 135 - 145 mmol/L  Potassium 4.1 3.5 - 5.1 mmol/L   Chloride 106 101 - 111 mmol/L   BUN 30 (H) 6 - 20 mg/dL   Creatinine, Ser 1.61 (H) 0.61 - 1.24 mg/dL   Glucose, Bld 096 (H) 65 - 99 mg/dL   Calcium, Ion 0.45 4.09 - 1.30 mmol/L   TCO2 21 0 - 100 mmol/L   Hemoglobin 12.2 (L) 13.0 - 17.0 g/dL   HCT 81.1 (L) 91.4 - 78.2 %  Troponin I     Status: None   Collection Time: 10/21/14 10:20 PM  Result Value Ref Range   Troponin I 0.03 <0.031 ng/mL  Troponin I     Status: None   Collection Time: 10/22/14  3:42 AM  Result Value Ref Range   Troponin I 0.03 <0.031 ng/mL  Basic metabolic panel     Status: Abnormal   Collection Time: 10/22/14  3:42 AM  Result Value Ref Range   Sodium 136 135 - 145 mmol/L   Potassium 4.2 3.5 - 5.1 mmol/L   Chloride 108 101 - 111 mmol/L   CO2 22 22 - 32 mmol/L   Glucose, Bld 111 (H) 65 - 99 mg/dL   BUN 26 (H) 6 - 20 mg/dL   Creatinine, Ser 9.56 (H) 0.61 - 1.24 mg/dL   Calcium 8.8 (L) 8.9 - 10.3 mg/dL   GFR calc non Af Amer 30 (L) >60 mL/min   GFR calc Af Amer 35 (L) >60 mL/min   Anion gap 6 5 - 15  Lipid panel     Status: None   Collection Time: 10/22/14  3:42 AM  Result Value Ref Range   Cholesterol 156 0 - 200 mg/dL   Triglycerides 213 <086 mg/dL   HDL 41 >57 mg/dL   Total CHOL/HDL Ratio 3.8 RATIO   VLDL 25 0 - 40 mg/dL   LDL Cholesterol 90 0 - 99 mg/dL    Studies/Results: Dg Chest 2  View  10/21/2014   CLINICAL DATA:  Chest pressure.  Shortness of breath.  EXAM: CHEST - 2 VIEW  COMPARISON:  Two-view chest x-ray 04/28/2012. Thoracic spine radiographs 09/13/2013.  FINDINGS: The heart size is normal. Chronic interstitial coarsening is evident. No focal nodule, mass, or airspace disease is present. Chronic interstitial coarsening is stable. The thoracic compression fracture T4 is stable. The T11 fracture has progressed.  IMPRESSION: 1. Progressive T11 fracture is age indeterminate. 2. Stable T4 fracture. 3. Mild diffuse interstitial coarsening is stable. 4. No acute cardiopulmonary disease.   Electronically Signed   By: Marin Roberts M.D.   On: 10/21/2014 16:38    Medications: Reviewed   @PROBHOSP @  1  CP  Trop negative  For myoview today  This was abnormal with evid of inferolateral ischemia  Anteroseptal scar with periinarct ischemia Discussed with pt  He is active  CP occurred with activity  Has not had since  Does note increase fatigue with exertion   I would recom L heart catht to define anatomy  Tentative for Tues Will need hydration.prior   2  HTN  BP is high  I would add low dse lopressor  Cut back on ACEI with upcoming cath  Follow    3.  Lipids  LDL 90   Follow for now     Dietrich Pates 10/22/2014, 8:00 AM

## 2014-10-22 NOTE — Plan of Care (Signed)
Problem: Phase II Progression Outcomes Goal: Stress Test if indicated Outcome: Progressing Stress test planned to be completed today

## 2014-10-22 NOTE — Progress Notes (Addendum)
Myoview results IMPRESSION: 1. Moderate size infarct of the anterior septal wall with mild peri-infarct ischemia.  2. Moderate sized region of moderate reversible ischemia in the mid and basilar segments of the inferolateral wall.  3. Dyskinesia of the anterior septal wall.  4. Left ventricular ejection fraction 56%  5. High -risk stress test findings*.

## 2014-10-23 DIAGNOSIS — R0989 Other specified symptoms and signs involving the circulatory and respiratory systems: Secondary | ICD-10-CM | POA: Diagnosis present

## 2014-10-23 DIAGNOSIS — K219 Gastro-esophageal reflux disease without esophagitis: Secondary | ICD-10-CM | POA: Diagnosis present

## 2014-10-23 DIAGNOSIS — E119 Type 2 diabetes mellitus without complications: Secondary | ICD-10-CM | POA: Diagnosis present

## 2014-10-23 DIAGNOSIS — R931 Abnormal findings on diagnostic imaging of heart and coronary circulation: Secondary | ICD-10-CM | POA: Diagnosis not present

## 2014-10-23 LAB — GLUCOSE, CAPILLARY
GLUCOSE-CAPILLARY: 126 mg/dL — AB (ref 65–99)
Glucose-Capillary: 93 mg/dL (ref 65–99)
Glucose-Capillary: 95 mg/dL (ref 65–99)

## 2014-10-23 MED ORDER — INSULIN ASPART 100 UNIT/ML ~~LOC~~ SOLN: 0.0000 [IU] | Freq: Every day | SUBCUTANEOUS | Status: DC

## 2014-10-23 MED ORDER — SODIUM CHLORIDE 0.9 % IV SOLN: INTRAVENOUS | Status: DC

## 2014-10-23 MED ORDER — INSULIN ASPART 100 UNIT/ML ~~LOC~~ SOLN
0.0000 [IU] | Freq: Three times a day (TID) | SUBCUTANEOUS | Status: DC
  Administered 2014-10-24: 2 [IU] via SUBCUTANEOUS

## 2014-10-23 NOTE — Plan of Care (Signed)
Problem: Phase II Progression Outcomes Goal: Cath/PCI Day Path if indicated Outcome: Progressing Cath is planned to be completed 9/6

## 2014-10-23 NOTE — Progress Notes (Signed)
Pt educated but refuses to have bed alarm placed on overnight. Will continue to monitor closely 

## 2014-10-23 NOTE — Progress Notes (Signed)
Subjective:  No chest pain  Objective:  Vital Signs in the last 24 hours: Temp:  [98.2 F (36.8 C)-98.6 F (37 C)] 98.2 F (36.8 C) (09/04 0500) Pulse Rate:  [61-87] 61 (09/04 0500) Resp:  [16-17] 16 (09/04 0500) BP: (130-174)/(55-82) 130/55 mmHg (09/04 0500) SpO2:  [95 %-98 %] 98 % (09/04 0500) Weight:  [178 lb 6.4 oz (80.922 kg)] 178 lb 6.4 oz (80.922 kg) (09/04 0109)  Intake/Output from previous day:  Intake/Output Summary (Last 24 hours) at 10/23/14 0751 Last data filed at 10/22/14 2100  Gross per 24 hour  Intake    460 ml  Output      0 ml  Net    460 ml    Physical Exam: General appearance: alert, cooperative and no distress Neck: no JVD and Rt carotid bruit Lungs: clear to auscultation bilaterally Heart: regular rate and rhythm Extremities: extremities normal, atraumatic, no cyanosis or edema Skin: Skin color, texture, turgor normal. No rashes or lesions Neurologic: Grossly normal   Rate: 66  Rhythm: normal sinus rhythm  Lab Results:  Recent Labs  10/21/14 1620 10/21/14 1634  WBC 7.6  --   HGB 12.0* 12.2*  PLT 179  --     Recent Labs  10/21/14 1626 10/21/14 1634 10/22/14 0342  NA 140 141 136  K 4.5 4.1 4.2  CL 108 106 108  CO2 26  --  22  GLUCOSE 124* 119* 111*  BUN 29* 30* 26*  CREATININE 1.87* 1.90* 1.86*    Recent Labs  10/22/14 0342 10/22/14 1138  TROPONINI 0.03 <0.03   No results for input(s): INR in the last 72 hours.  Scheduled Meds: . amLODipine  5 mg Oral Daily  . aspirin EC  81 mg Oral Daily  . cholecalciferol  1,000 Units Oral Daily  . docusate sodium  100 mg Oral Daily  . heparin  5,000 Units Subcutaneous 3 times per day  . metoprolol tartrate  25 mg Oral BID  . omega-3 acid ethyl esters  1 g Oral Daily   Continuous Infusions:  PRN Meds:.acetaminophen, nitroGLYCERIN, ondansetron (ZOFRAN) IV   Imaging: Dg Chest 2 View  10/21/2014   CLINICAL DATA:  Chest pressure.  Shortness of breath.  EXAM: CHEST - 2 VIEW   COMPARISON:  Two-view chest x-ray 04/28/2012. Thoracic spine radiographs 09/13/2013.  FINDINGS: The heart size is normal. Chronic interstitial coarsening is evident. No focal nodule, mass, or airspace disease is present. Chronic interstitial coarsening is stable. The thoracic compression fracture T4 is stable. The T11 fracture has progressed.  IMPRESSION: 1. Progressive T11 fracture is age indeterminate. 2. Stable T4 fracture. 3. Mild diffuse interstitial coarsening is stable. 4. No acute cardiopulmonary disease.   Electronically Signed   By: Marin Roberts M.D.   On: 10/21/2014 16:38   Nm Myocar Multi W/spect W/wall Motion / Ef 10/22/2014    IMPRESSION: 1. Moderate size infarct of the anterior septal wall with mild peri-infarct ischemia.  2. Moderate sized region of moderate reversible ischemia in the mid and basilar segments of the inferolateral wall.  .  2. Dyskinesia of the anterior septal wall.  3. Left ventricular ejection fraction 56%  4.  High -risk stress test findings*.  *2012 Appropriate Use Criteria for Coronary Revascularization Focused Update: J Am Coll Cardiol. 2012;59(9):857-881. http://content.dementiazones.com.aspx?articleid=1201161  These results will be called to the ordering clinician or representative by the Radiologist Assistant, and communication documented in the PACS or zVision Dashboard.   Electronically Signed   By:  Genevive Bi M.D.   On: 10/22/2014 12:42    Cardiac Studies: Echo 10/22/14 Study Conclusions  - Left ventricle: The cavity size was normal. Wall thickness was normal. Systolic function was normal. The estimated ejection fraction was in the range of 55% to 60%. Wall motion was normal; there were no regional wall motion abnormalities. Doppler parameters are consistent with abnormal left ventricular relaxation (grade 1 diastolic dysfunction). - Ventricular septum: Septal motion showed paradox. These changes are consistent with a left bundle  branch block. - Aortic valve: There was trivial regurgitation.  Assessment/Plan:  81 y.o. male (looks younger) with a history of HTN, DM, CRI, and GERD, otherwise healthy who presented to [facility] ED 10/21/14 for evaluation of chest pain. He ruled out for an MI. Myoview done was "high risk".   Principal Problem:   Anginal pain Active Problems:   Abnormal nuclear cardiac imaging test   Essential hypertension   Chronic renal disease, stage III   Carotid bruit-Rt   Type 2 diabetes with stage 3 chronic kidney disease GFR 30-59   DJD (degenerative joint disease), thoracic   GERD (gastroesophageal reflux disease)   PLAN: Will review with MD. He was on Metformin at home- start sliding scale.    ? Cath- high risk study but no further chest pain, normal LVF, and CRI 3B.  Plan for cath tomorrow  2.  Carotid bruit Check carotid dopplers (RCA bruit).    3  Lipids  LDL 90    4  Renal  Will hydrate tomorrow    Corine Shelter PA-C 10/23/2014, 7:51 AM [phone]  Pt seen and examined  I have amended note above to relfect my findings  Plan cath on Tuesday  [facility]

## 2014-10-24 ENCOUNTER — Encounter (HOSPITAL_COMMUNITY): Payer: Medicare Other

## 2014-10-24 DIAGNOSIS — I209 Angina pectoris, unspecified: Secondary | ICD-10-CM | POA: Diagnosis not present

## 2014-10-24 LAB — BASIC METABOLIC PANEL
Anion gap: 8 (ref 5–15)
BUN: 29 mg/dL — ABNORMAL HIGH (ref 6–20)
CHLORIDE: 104 mmol/L (ref 101–111)
CO2: 25 mmol/L (ref 22–32)
CREATININE: 1.87 mg/dL — AB (ref 0.61–1.24)
Calcium: 9.2 mg/dL (ref 8.9–10.3)
GFR, EST AFRICAN AMERICAN: 35 mL/min — AB (ref >60)
GFR, EST NON AFRICAN AMERICAN: 30 mL/min — AB (ref >60)
Glucose, Bld: 104 mg/dL — ABNORMAL HIGH (ref 65–99)
POTASSIUM: 4.3 mmol/L (ref 3.5–5.1)
SODIUM: 137 mmol/L (ref 135–145)

## 2014-10-24 LAB — GLUCOSE, CAPILLARY
GLUCOSE-CAPILLARY: 109 mg/dL — AB (ref 65–99)
GLUCOSE-CAPILLARY: 127 mg/dL — AB (ref 65–99)
Glucose-Capillary: 115 mg/dL — ABNORMAL HIGH (ref 65–99)
Glucose-Capillary: 101 mg/dL — ABNORMAL HIGH (ref 65–99)

## 2014-10-24 MED ORDER — SODIUM CHLORIDE 0.9 % WEIGHT BASED INFUSION
1.0000 mL/kg/h | INTRAVENOUS | Status: DC
  Administered 2014-10-24 – 2014-10-25 (×2): 1 mL/kg/h via INTRAVENOUS

## 2014-10-24 MED ORDER — SODIUM CHLORIDE 0.9 % IV SOLN: 250.0000 mL | INTRAVENOUS | Status: DC | PRN

## 2014-10-24 MED ORDER — ASPIRIN 81 MG PO CHEW
81.0000 mg | CHEWABLE_TABLET | ORAL | Status: AC
  Administered 2014-10-25: 81 mg via ORAL
  Filled 2014-10-24: qty 1

## 2014-10-24 MED ORDER — ATORVASTATIN CALCIUM 40 MG PO TABS
40.0000 mg | ORAL_TABLET | Freq: Every day | ORAL | Status: DC
  Administered 2014-10-24 – 2014-10-26 (×3): 40 mg via ORAL
  Filled 2014-10-24 (×3): qty 1

## 2014-10-24 NOTE — Progress Notes (Signed)
Subjective:  No chest pain or dyspnea  Objective:  Vital Signs in the last 24 hours: Temp:  [97.5 F (36.4 C)-98.7 F (37.1 C)] 97.6 F (36.4 C) (09/05 0458) Pulse Rate:  [60-72] 65 (09/05 0458) Resp:  [16] 16 (09/04 1444) BP: (115-139)/(58-70) 115/58 mmHg (09/05 0458) SpO2:  [99 %-100 %] 99 % (09/05 0458) Weight:  [176 lb (79.833 kg)] 176 lb (79.833 kg) (09/05 0458)  Intake/Output from previous day:  Intake/Output Summary (Last 24 hours) at 10/24/14 0817 Last data filed at 10/23/14 2023  Gross per 24 hour  Intake    240 ml  Output      0 ml  Net    240 ml    Physical Exam: General appearance: alert, cooperative and no distress HEENT: normal Neck: supple Lungs: clear to auscultation bilaterally Heart: regular rate and rhythm Abd: soft; not tender or distended Extremities: no edema Neurologic: Grossly normal   Rate: 66  Rhythm: normal sinus rhythm  Lab Results:  Recent Labs  10/21/14 1620 10/21/14 1634  WBC 7.6  --   HGB 12.0* 12.2*  PLT 179  --     Recent Labs  10/22/14 0342 10/24/14 0303  NA 136 137  K 4.2 4.3  CL 108 104  CO2 22 25  GLUCOSE 111* 104*  BUN 26* 29*  CREATININE 1.86* 1.87*    Recent Labs  10/22/14 0342 10/22/14 1138  TROPONINI 0.03 <0.03    Scheduled Meds: . amLODipine  5 mg Oral Daily  . aspirin EC  81 mg Oral Daily  . cholecalciferol  1,000 Units Oral Daily  . docusate sodium  100 mg Oral Daily  . heparin  5,000 Units Subcutaneous 3 times per day  . insulin aspart  0-15 Units Subcutaneous TID WC  . insulin aspart  0-5 Units Subcutaneous QHS  . metoprolol tartrate  25 mg Oral BID  . omega-3 acid ethyl esters  1 g Oral Daily   Continuous Infusions: . sodium chloride     PRN Meds:.acetaminophen, nitroGLYCERIN, ondansetron (ZOFRAN) IV   Imaging: Dg Chest 2 View  10/21/2014   CLINICAL DATA:  Chest pressure.  Shortness of breath.  EXAM: CHEST - 2 VIEW  COMPARISON:  Two-view chest x-ray 04/28/2012. Thoracic spine  radiographs 09/13/2013.  FINDINGS: The heart size is normal. Chronic interstitial coarsening is evident. No focal nodule, mass, or airspace disease is present. Chronic interstitial coarsening is stable. The thoracic compression fracture T4 is stable. The T11 fracture has progressed.  IMPRESSION: 1. Progressive T11 fracture is age indeterminate. 2. Stable T4 fracture. 3. Mild diffuse interstitial coarsening is stable. 4. No acute cardiopulmonary disease.   Electronically Signed   By: Marin Roberts M.D.   On: 10/21/2014 16:38   Nm Myocar Multi W/spect W/wall Motion / Ef 10/22/2014    IMPRESSION: 1. Moderate size infarct of the anterior septal wall with mild peri-infarct ischemia.  2. Moderate sized region of moderate reversible ischemia in the mid and basilar segments of the inferolateral wall.  .  2. Dyskinesia of the anterior septal wall.  3. Left ventricular ejection fraction 56%  4.  High -risk stress test findings*.  *2012 Appropriate Use Criteria for Coronary Revascularization Focused Update: J Am Coll Cardiol. 2012;59(9):857-881. http://content.dementiazones.com.aspx?articleid=1201161  These results will be called to the ordering clinician or representative by the Radiologist Assistant, and communication documented in the PACS or zVision Dashboard.   Electronically Signed   By: Genevive Bi M.D.   On: 10/22/2014 12:42  Cardiac Studies: Echo 10/22/14 Study Conclusions  - Left ventricle: The cavity size was normal. Wall thickness was normal. Systolic function was normal. The estimated ejection fraction was in the range of 55% to 60%. Wall motion was normal; there were no regional wall motion abnormalities. Doppler parameters are consistent with abnormal left ventricular relaxation (grade 1 diastolic dysfunction). - Ventricular septum: Septal motion showed paradox. These changes are consistent with a left bundle branch block. - Aortic valve: There was trivial  regurgitation.  Assessment/Plan:  81 y.o. male with a history of HTN, DM, CRI, and GERD, otherwise healthy who presented to [facility] ED 10/21/14 for evaluation of chest pain. He ruled out for an MI. Myoview done was "high risk".   Principal Problem:   Anginal pain Active Problems:   Essential hypertension   Chronic renal disease, stage III   DJD (degenerative joint disease), thoracic   Abnormal nuclear cardiac imaging test   Carotid bruit-Rt   GERD (gastroesophageal reflux disease)   Type 2 diabetes with stage 3 chronic kidney disease GFR 30-59   PLAN:  1 UA-plan cath in AM; risks and benefits discussed and pt agrees to proceed; no vgram; hydrate precath; follow renal function following procedure. Continue ASA, metoprolol; add statin. Hold glucophage for 48 hrs following procedure.  2.  Carotid bruit Check carotid dopplers (RCA bruit).    3  Lipids  Given CAD, add statin  4  Stage 3 renal insuff-hydrate precath and follow renal function following procedure  5 Hypertension-continue present meds  Olga Millers

## 2014-10-25 ENCOUNTER — Encounter (HOSPITAL_COMMUNITY)
Admission: EM | Disposition: A | Discharge status: Home / Self Care | Payer: Medicare Other | Source: Home / Self Care | Attending: Cardiovascular Disease

## 2014-10-25 ENCOUNTER — Observation Stay (HOSPITAL_COMMUNITY): Payer: Medicare Other

## 2014-10-25 ENCOUNTER — Encounter (HOSPITAL_COMMUNITY): Payer: Self-pay | Admitting: Cardiovascular Disease

## 2014-10-25 DIAGNOSIS — I249 Acute ischemic heart disease, unspecified: Secondary | ICD-10-CM | POA: Diagnosis present

## 2014-10-25 DIAGNOSIS — M479 Spondylosis, unspecified: Secondary | ICD-10-CM | POA: Diagnosis present

## 2014-10-25 DIAGNOSIS — E1122 Type 2 diabetes mellitus with diabetic chronic kidney disease: Secondary | ICD-10-CM

## 2014-10-25 DIAGNOSIS — I129 Hypertensive chronic kidney disease with stage 1 through stage 4 chronic kidney disease, or unspecified chronic kidney disease: Secondary | ICD-10-CM | POA: Diagnosis present

## 2014-10-25 DIAGNOSIS — Z79899 Other long term (current) drug therapy: Secondary | ICD-10-CM | POA: Diagnosis not present

## 2014-10-25 DIAGNOSIS — R079 Chest pain, unspecified: Secondary | ICD-10-CM

## 2014-10-25 DIAGNOSIS — Z7982 Long term (current) use of aspirin: Secondary | ICD-10-CM | POA: Diagnosis not present

## 2014-10-25 DIAGNOSIS — I2510 Atherosclerotic heart disease of native coronary artery without angina pectoris: Secondary | ICD-10-CM | POA: Diagnosis not present

## 2014-10-25 DIAGNOSIS — Z96641 Presence of right artificial hip joint: Secondary | ICD-10-CM | POA: Diagnosis present

## 2014-10-25 DIAGNOSIS — K219 Gastro-esophageal reflux disease without esophagitis: Secondary | ICD-10-CM | POA: Diagnosis present

## 2014-10-25 DIAGNOSIS — I25110 Atherosclerotic heart disease of native coronary artery with unstable angina pectoris: Principal | ICD-10-CM

## 2014-10-25 DIAGNOSIS — Z9861 Coronary angioplasty status: Secondary | ICD-10-CM | POA: Diagnosis not present

## 2014-10-25 DIAGNOSIS — N183 Chronic kidney disease, stage 3 (moderate): Secondary | ICD-10-CM

## 2014-10-25 DIAGNOSIS — Z87891 Personal history of nicotine dependence: Secondary | ICD-10-CM | POA: Diagnosis not present

## 2014-10-25 DIAGNOSIS — I200 Unstable angina: Secondary | ICD-10-CM | POA: Diagnosis not present

## 2014-10-25 HISTORY — PX: CARDIAC CATHETERIZATION: SHX172

## 2014-10-25 LAB — BASIC METABOLIC PANEL
ANION GAP: 6 (ref 5–15)
BUN: 31 mg/dL — ABNORMAL HIGH (ref 6–20)
CALCIUM: 9 mg/dL (ref 8.9–10.3)
CO2: 24 mmol/L (ref 22–32)
CREATININE: 1.78 mg/dL — AB (ref 0.61–1.24)
Chloride: 106 mmol/L (ref 101–111)
GFR calc non Af Amer: 32 mL/min — ABNORMAL LOW (ref >60)
GFR, EST AFRICAN AMERICAN: 37 mL/min — AB (ref >60)
Glucose, Bld: 115 mg/dL — ABNORMAL HIGH (ref 65–99)
Potassium: 4.4 mmol/L (ref 3.5–5.1)
SODIUM: 136 mmol/L (ref 135–145)

## 2014-10-25 LAB — CBC
HCT: 36.1 % — ABNORMAL LOW (ref 39.0–52.0)
HEMOGLOBIN: 11.7 g/dL — AB (ref 13.0–17.0)
MCH: 29 pg (ref 26.0–34.0)
MCHC: 32.4 g/dL (ref 30.0–36.0)
MCV: 89.6 fL (ref 78.0–100.0)
PLATELETS: 170 10*3/uL (ref 150–400)
RBC: 4.03 MIL/uL — AB (ref 4.22–5.81)
RDW: 13.9 % (ref 11.5–15.5)
WBC: 7.5 10*3/uL (ref 4.0–10.5)

## 2014-10-25 LAB — PROTIME-INR
INR: 1.09 (ref 0.00–1.49)
PROTHROMBIN TIME: 14.3 s (ref 11.6–15.2)

## 2014-10-25 LAB — GLUCOSE, CAPILLARY
GLUCOSE-CAPILLARY: 108 mg/dL — AB (ref 65–99)
Glucose-Capillary: 92 mg/dL (ref 65–99)
Glucose-Capillary: 103 mg/dL — ABNORMAL HIGH (ref 65–99)
Glucose-Capillary: 127 mg/dL — ABNORMAL HIGH (ref 65–99)

## 2014-10-25 LAB — HEMOGLOBIN A1C
Hgb A1c MFr Bld: 6.1 % — ABNORMAL HIGH (ref 4.8–5.6)
Hgb A1c MFr Bld: 6.1 % — ABNORMAL HIGH (ref 4.8–5.6)
Mean Plasma Glucose: 128 mg/dL
Mean Plasma Glucose: 128 mg/dL

## 2014-10-25 SURGERY — LEFT HEART CATH AND CORONARY ANGIOGRAPHY
Anesthesia: LOCAL

## 2014-10-25 MED ORDER — MIDAZOLAM HCL 2 MG/2ML IJ SOLN
INTRAMUSCULAR | Status: AC
  Filled 2014-10-25: qty 4

## 2014-10-25 MED ORDER — CLOPIDOGREL BISULFATE 75 MG PO TABS
75.0000 mg | ORAL_TABLET | Freq: Every day | ORAL | Status: DC
  Administered 2014-10-27 (×2): 75 mg via ORAL
  Filled 2014-10-25 (×3): qty 1

## 2014-10-25 MED ORDER — ASPIRIN 81 MG PO CHEW
81.0000 mg | CHEWABLE_TABLET | ORAL | Status: AC
  Administered 2014-10-26: 81 mg via ORAL
  Filled 2014-10-25: qty 1

## 2014-10-25 MED ORDER — VERAPAMIL HCL 2.5 MG/ML IV SOLN
INTRAVENOUS | Status: AC
  Filled 2014-10-25: qty 2

## 2014-10-25 MED ORDER — SODIUM CHLORIDE 0.9 % IJ SOLN: 3.0000 mL | INTRAMUSCULAR | Status: DC | PRN

## 2014-10-25 MED ORDER — SODIUM CHLORIDE 0.9 % IV SOLN: INTRAVENOUS | Status: AC

## 2014-10-25 MED ORDER — IOHEXOL 350 MG/ML SOLN
INTRAVENOUS | Status: DC | PRN
  Administered 2014-10-25: 80 mL via INTRACARDIAC

## 2014-10-25 MED ORDER — SODIUM CHLORIDE 0.9 % IV SOLN
INTRAVENOUS | Status: DC
  Administered 2014-10-26: 06:00:00 via INTRAVENOUS

## 2014-10-25 MED ORDER — MIDAZOLAM HCL 2 MG/2ML IJ SOLN
INTRAMUSCULAR | Status: DC | PRN
  Administered 2014-10-25: 1 mg via INTRAVENOUS

## 2014-10-25 MED ORDER — HEPARIN SODIUM (PORCINE) 1000 UNIT/ML IJ SOLN
INTRAMUSCULAR | Status: AC
  Filled 2014-10-25: qty 1

## 2014-10-25 MED ORDER — LIDOCAINE HCL (PF) 1 % IJ SOLN
INTRAMUSCULAR | Status: DC | PRN
  Administered 2014-10-25: 11:00:00

## 2014-10-25 MED ORDER — HEPARIN (PORCINE) IN NACL 2-0.9 UNIT/ML-% IJ SOLN
INTRAMUSCULAR | Status: AC
  Filled 2014-10-25: qty 1000

## 2014-10-25 MED ORDER — SODIUM CHLORIDE 0.9 % IV SOLN: 250.0000 mL | INTRAVENOUS | Status: DC | PRN

## 2014-10-25 MED ORDER — VERAPAMIL HCL 2.5 MG/ML IV SOLN
INTRAVENOUS | Status: DC | PRN
  Administered 2014-10-25: 11:00:00 via INTRA_ARTERIAL

## 2014-10-25 MED ORDER — CLOPIDOGREL BISULFATE 300 MG PO TABS
ORAL_TABLET | ORAL | Status: DC | PRN
  Administered 2014-10-25: 600 mg via ORAL

## 2014-10-25 MED ORDER — NITROGLYCERIN 1 MG/10 ML FOR IR/CATH LAB
INTRA_ARTERIAL | Status: AC
  Filled 2014-10-25: qty 10

## 2014-10-25 MED ORDER — CLOPIDOGREL BISULFATE 300 MG PO TABS
ORAL_TABLET | ORAL | Status: AC
  Filled 2014-10-25: qty 1

## 2014-10-25 MED ORDER — LIDOCAINE HCL (PF) 1 % IJ SOLN
INTRAMUSCULAR | Status: AC
  Filled 2014-10-25: qty 30

## 2014-10-25 MED ORDER — SODIUM CHLORIDE 0.9 % IJ SOLN
3.0000 mL | Freq: Two times a day (BID) | INTRAMUSCULAR | Status: DC
  Administered 2014-10-26: 3 mL via INTRAVENOUS

## 2014-10-25 MED ORDER — HEPARIN (PORCINE) IN NACL 2-0.9 UNIT/ML-% IJ SOLN
INTRAMUSCULAR | Status: AC
  Filled 2014-10-25: qty 500

## 2014-10-25 MED ORDER — HEPARIN SODIUM (PORCINE) 1000 UNIT/ML IJ SOLN
INTRAMUSCULAR | Status: DC | PRN
  Administered 2014-10-25: 4000 [IU] via INTRAVENOUS

## 2014-10-25 SURGICAL SUPPLY — 12 items
CATH INFINITI 5 FR JL3.5 (CATHETERS) ×2 IMPLANT
CATH INFINITI 5FR AL1 (CATHETERS) ×1 IMPLANT
CATH INFINITI 5FR ANG PIGTAIL (CATHETERS) ×2 IMPLANT
CATH INFINITI JR4 5F (CATHETERS) ×2 IMPLANT
DEVICE RAD COMP TR BAND LRG (VASCULAR PRODUCTS) ×2 IMPLANT
GLIDESHEATH SLEND SS 6F .021 (SHEATH) ×2 IMPLANT
KIT HEART LEFT (KITS) ×2 IMPLANT
PACK CARDIAC CATHETERIZATION (CUSTOM PROCEDURE TRAY) ×2 IMPLANT
TRANSDUCER W/STOPCOCK (MISCELLANEOUS) ×2 IMPLANT
TUBING CIL FLEX 10 FLL-RA (TUBING) ×2 IMPLANT
WIRE HI TORQ VERSACORE-J 145CM (WIRE) ×1 IMPLANT
WIRE SAFE-T 1.5MM-J .035X260CM (WIRE) ×2 IMPLANT

## 2014-10-25 NOTE — Interval H&P Note (Signed)
History and Physical Interval Note:  10/25/2014 10:32 AM  [name]  has presented today for cardiac cath with the diagnosis of unstable angina  The various methods of treatment have been discussed with the patient and family. After consideration of risks, benefits and other options for treatment, the patient has consented to  Procedure(s): Left Heart Cath and Coronary Angiography (N/A) as a surgical intervention .  The patient's history has been reviewed, patient examined, no change in status, stable for surgery.  I have reviewed the patient's chart and labs.  Questions were answered to the patient's satisfaction.    Cath Lab Visit (complete for each Cath Lab visit)  Clinical Evaluation Leading to the Procedure:   ACS: No.  Non-ACS:    Anginal Classification: CCS III  Anti-ischemic medical therapy: Minimal Therapy (1 class of medications)  Non-Invasive Test Results: High-risk stress test findings: cardiac mortality >3%/year  Prior CABG: No previous CABG         [name]

## 2014-10-25 NOTE — Progress Notes (Signed)
Pt educated but refuses to have bed alarm placed on overnight. Will continue to monitor closely 

## 2014-10-25 NOTE — Progress Notes (Signed)
VASCULAR LAB PRELIMINARY  PRELIMINARY  PRELIMINARY  PRELIMINARY  Carotid duplex completed.    Preliminary report:  Bilateral:  1-39% ICA stenosis.  Vertebral artery flow is antegrade.     Tyeesha Riker, RVS 10/25/2014, 3:41 PM

## 2014-10-25 NOTE — H&P (View-Only) (Signed)
Patient Name: [name] Date of Encounter: 10/25/2014  Principal Problem:   Anginal pain Active Problems:   Essential hypertension   Chronic renal disease, stage III   DJD (degenerative joint disease), thoracic   Abnormal nuclear cardiac imaging test   Carotid bruit-Rt   GERD (gastroesophageal reflux disease)   Type 2 diabetes with stage 3 chronic kidney disease GFR 30-59   Length of Stay:   SUBJECTIVE  Feels well today, eager to have the heart cath done.  CURRENT MEDS . amLODipine  5 mg Oral Daily  . aspirin EC  81 mg Oral Daily  . atorvastatin  40 mg Oral q1800  . cholecalciferol  1,000 Units Oral Daily  . docusate sodium  100 mg Oral Daily  . heparin  5,000 Units Subcutaneous 3 times per day  . insulin aspart  0-15 Units Subcutaneous TID WC  . insulin aspart  0-5 Units Subcutaneous QHS  . metoprolol tartrate  25 mg Oral BID  . omega-3 acid ethyl esters  1 g Oral Daily    OBJECTIVE   Intake/Output Summary (Last 24 hours) at 10/25/14 0918 Last data filed at 10/24/14 2023  Gross per 24 hour  Intake    480 ml  Output      0 ml  Net    480 ml   Filed Weights   10/23/14 0109 10/24/14 0458 10/25/14 0500  Weight: 178 lb 6.4 oz (80.922 kg) 176 lb (79.833 kg) 176 lb 8 oz (80.06 kg)    PHYSICAL EXAM Filed Vitals:   10/24/14 0458 10/24/14 1257 10/24/14 2023 10/25/14 0500  BP: 115/58 110/59 145/70 128/62  Pulse: 65 59 66 59  Temp: 97.6 F (36.4 C) 98.5 F (36.9 C) 98.2 F (36.8 C) 98 F (36.7 C)  TempSrc: Oral Oral Oral Oral  Resp:  17 17   Height:      Weight: 176 lb (79.833 kg)   176 lb 8 oz (80.06 kg)  SpO2: 99% 99% 99% 98%   General: Alert, oriented x3, no distress Head: no evidence of trauma, PERRL, EOMI, no exophtalmos or lid lag, no myxedema, no xanthelasma; normal ears, nose and oropharynx Neck: normal jugular venous pulsations and no hepatojugular reflux; brisk carotid pulses without delay and no carotid bruits Chest: clear to auscultation, no  signs of consolidation by percussion or palpation, normal fremitus, symmetrical and full respiratory excursions Cardiovascular: normal position and quality of the apical impulse, regular rhythm, normal first and second heart sounds, no rubs or gallops, 1/6 aortic ejection murmur Abdomen: no tenderness or distention, no masses by palpation, no abnormal pulsatility or arterial bruits, normal bowel sounds, no hepatosplenomegaly Extremities: no clubbing, cyanosis or edema; 2+ radial, ulnar and brachial pulses bilaterally; 2+ right femoral, posterior tibial and dorsalis pedis pulses; 2+ left femoral, posterior tibial and dorsalis pedis pulses; no subclavian or femoral bruits Neurological: grossly nonfocal  LABS  CBC  Recent Labs  10/25/14 0448  WBC 7.5  HGB 11.7*  HCT 36.1*  MCV 89.6  PLT 170   Basic Metabolic Panel  Recent Labs  10/24/14 0303 10/25/14 0448  NA 137 136  K 4.3 4.4  CL 104 106  CO2 25 24  GLUCOSE 104* 115*  BUN 29* 31*  CREATININE 1.87* 1.78*  CALCIUM 9.2 9.0   Liver Function Tests No results for input(s): AST, ALT, ALKPHOS, BILITOT, PROT, ALBUMIN in the last 72 hours. No results for input(s): LIPASE, AMYLASE in the last 72 hours. Cardiac Enzymes  Recent Labs  10/22/14 1138  TROPONINI <0.03    Radiology Studies Imaging results have been reviewed and No results found.  TELE NSR  ASSESSMENT AND PLAN  Elderly, highly functional gentleman with new onset exertional angina and a high risk nuclear stress tests suggestive of mid LAD stenosis. For cath/PCI today. Discussed procedure with patient and extended group of family members at bedside. This procedure has been fully reviewed with the patient and written informed consent has been obtained.   Thurmon Fair, MD, [facility] [phone] office [phone] pager 10/25/2014 9:18 AM

## 2014-10-25 NOTE — Progress Notes (Signed)
Patient Name: [name] Date of Encounter: 10/25/2014  Principal Problem:   Anginal pain Active Problems:   Essential hypertension   Chronic renal disease, stage III   DJD (degenerative joint disease), thoracic   Abnormal nuclear cardiac imaging test   Carotid bruit-Rt   GERD (gastroesophageal reflux disease)   Type 2 diabetes with stage 3 chronic kidney disease GFR 30-59   Length of Stay:   SUBJECTIVE  Feels well today, eager to have the heart cath done.  CURRENT MEDS . amLODipine  5 mg Oral Daily  . aspirin EC  81 mg Oral Daily  . atorvastatin  40 mg Oral q1800  . cholecalciferol  1,000 Units Oral Daily  . docusate sodium  100 mg Oral Daily  . heparin  5,000 Units Subcutaneous 3 times per day  . insulin aspart  0-15 Units Subcutaneous TID WC  . insulin aspart  0-5 Units Subcutaneous QHS  . metoprolol tartrate  25 mg Oral BID  . omega-3 acid ethyl esters  1 g Oral Daily    OBJECTIVE   Intake/Output Summary (Last 24 hours) at 10/25/14 0918 Last data filed at 10/24/14 2023  Gross per 24 hour  Intake    480 ml  Output      0 ml  Net    480 ml   Filed Weights   10/23/14 0109 10/24/14 0458 10/25/14 0500  Weight: 178 lb 6.4 oz (80.922 kg) 176 lb (79.833 kg) 176 lb 8 oz (80.06 kg)    PHYSICAL EXAM Filed Vitals:   10/24/14 0458 10/24/14 1257 10/24/14 2023 10/25/14 0500  BP: 115/58 110/59 145/70 128/62  Pulse: 65 59 66 59  Temp: 97.6 F (36.4 C) 98.5 F (36.9 C) 98.2 F (36.8 C) 98 F (36.7 C)  TempSrc: Oral Oral Oral Oral  Resp:  17 17   Height:      Weight: 176 lb (79.833 kg)   176 lb 8 oz (80.06 kg)  SpO2: 99% 99% 99% 98%   General: Alert, oriented x3, no distress Head: no evidence of trauma, PERRL, EOMI, no exophtalmos or lid lag, no myxedema, no xanthelasma; normal ears, nose and oropharynx Neck: normal jugular venous pulsations and no hepatojugular reflux; brisk carotid pulses without delay and no carotid bruits Chest: clear to auscultation, no  signs of consolidation by percussion or palpation, normal fremitus, symmetrical and full respiratory excursions Cardiovascular: normal position and quality of the apical impulse, regular rhythm, normal first and second heart sounds, no rubs or gallops, 1/6 aortic ejection murmur Abdomen: no tenderness or distention, no masses by palpation, no abnormal pulsatility or arterial bruits, normal bowel sounds, no hepatosplenomegaly Extremities: no clubbing, cyanosis or edema; 2+ radial, ulnar and brachial pulses bilaterally; 2+ right femoral, posterior tibial and dorsalis pedis pulses; 2+ left femoral, posterior tibial and dorsalis pedis pulses; no subclavian or femoral bruits Neurological: grossly nonfocal  LABS  CBC  Recent Labs  10/25/14 0448  WBC 7.5  HGB 11.7*  HCT 36.1*  MCV 89.6  PLT 170   Basic Metabolic Panel  Recent Labs  10/24/14 0303 10/25/14 0448  NA 137 136  K 4.3 4.4  CL 104 106  CO2 25 24  GLUCOSE 104* 115*  BUN 29* 31*  CREATININE 1.87* 1.78*  CALCIUM 9.2 9.0   Liver Function Tests No results for input(s): AST, ALT, ALKPHOS, BILITOT, PROT, ALBUMIN in the last 72 hours. No results for input(s): LIPASE, AMYLASE in the last 72 hours. Cardiac Enzymes  Recent Labs  10/22/14 1138  TROPONINI <0.03    Radiology Studies Imaging results have been reviewed and No results found.  TELE NSR  ASSESSMENT AND PLAN  Elderly, highly functional gentleman with new onset exertional angina and a high risk nuclear stress tests suggestive of mid LAD stenosis. For cath/PCI today. Discussed procedure with patient and extended group of family members at bedside. This procedure has been fully reviewed with the patient and written informed consent has been obtained.   Thurmon Fair, MD, [facility] [phone] office [phone] pager 10/25/2014 9:18 AM

## 2014-10-25 NOTE — Progress Notes (Signed)
Patient Profile: 81 y.o. male (looks younger) with a history of HTN, DM, CRI, and GERD, otherwise healthy who presented to [facility] ED 10/21/14 for evaluation of chest pain. He ruled out for an MI. Myoview done was "high risk".   Subjective: No complaints. Denies any chest pain or dyspnea overnight.   Objective: Vital signs in last 24 hours: Temp:  [98 F (36.7 C)-98.5 F (36.9 C)] 98 F (36.7 C) (09/06 0500) Pulse Rate:  [59-66] 59 (09/06 0500) Resp:  [17] 17 (09/05 2023) BP: (110-145)/(59-70) 128/62 mmHg (09/06 0500) SpO2:  [98 %-99 %] 98 % (09/06 0500) Weight:  [176 lb 8 oz (80.06 kg)] 176 lb 8 oz (80.06 kg) (09/06 0500) Last BM Date: 10/23/14  Intake/Output from previous day: 09/05 0701 - 09/06 0700 In: 720 [P.O.:720] Out: -  Intake/Output this shift:    Medications Current Facility-Administered Medications  Medication Dose Route Frequency Provider Last Rate Last Dose  . 0.9 %  sodium chloride infusion  250 mL Intravenous PRN Lewayne Bunting, MD      . 0.9% sodium chloride infusion  1 mL/kg/hr Intravenous Continuous Lewayne Bunting, MD 79.8 mL/hr at 10/24/14 2023 1 mL/kg/hr at 10/24/14 2023  . acetaminophen (TYLENOL) tablet 650 mg  650 mg Oral Q4H PRN Bhavinkumar Bhagat, PA      . amLODipine (NORVASC) tablet 5 mg  5 mg Oral Daily Bhavinkumar Bhagat, PA   5 mg at 10/24/14 0811  . aspirin EC tablet 81 mg  81 mg Oral Daily Bhavinkumar Bhagat, PA   81 mg at 10/24/14 0810  . atorvastatin (LIPITOR) tablet 40 mg  40 mg Oral q1800 Lewayne Bunting, MD   40 mg at 10/24/14 2158  . cholecalciferol (VITAMIN D) tablet 1,000 Units  1,000 Units Oral Daily Bhavinkumar Bhagat, PA   1,000 Units at 10/24/14 0810  . docusate sodium (COLACE) capsule 100 mg  100 mg Oral Daily Bhavinkumar Bhagat, PA   100 mg at 10/23/14 1032  . heparin injection 5,000 Units  5,000 Units Subcutaneous 3 times per day Manson Passey, PA   5,000 Units at 10/24/14 2026  . insulin aspart (novoLOG) injection 0-15  Units  0-15 Units Subcutaneous TID WC Abelino Derrick, PA-C   2 Units at 10/24/14 1159  . insulin aspart (novoLOG) injection 0-5 Units  0-5 Units Subcutaneous QHS Abelino Derrick, PA-C   0 Units at 10/23/14 2126  . metoprolol tartrate (LOPRESSOR) tablet 25 mg  25 mg Oral BID Pricilla Riffle, MD   25 mg at 10/24/14 2023  . nitroGLYCERIN (NITROSTAT) SL tablet 0.4 mg  0.4 mg Sublingual Q5 Min x 3 PRN Bhavinkumar Bhagat, PA      . omega-3 acid ethyl esters (LOVAZA) capsule 1 g  1 g Oral Daily Bhavinkumar Bhagat, PA   1 g at 10/24/14 0811  . ondansetron (ZOFRAN) injection 4 mg  4 mg Intravenous Q6H PRN Bhavinkumar Bhagat, PA        PE: General appearance: alert, cooperative and no distress Neck: no carotid bruit and no JVD Lungs: clear to auscultation bilaterally Heart: regular rate and rhythm, S1, S2 normal, no murmur, click, rub or gallop Extremities: no LEE Pulses: 2+ and symmetric Skin: warm and dry Neurologic: Grossly normal  Lab Results:   Recent Labs  10/25/14 0448  WBC 7.5  HGB 11.7*  HCT 36.1*  PLT 170   BMET  Recent Labs  10/24/14 0303 10/25/14 0448  NA 137 136  K 4.3 4.4  CL 104 106  CO2 25 24  GLUCOSE 104* 115*  BUN 29* 31*  CREATININE 1.87* 1.78*  CALCIUM 9.2 9.0   PT/INR  Recent Labs  10/25/14 0448  LABPROT 14.3  INR 1.09   Cardiac Panel (last 3 results)  Recent Labs  10/22/14 1138  TROPONINI <0.03    Studies/Results: NST 10/22/14 IMPRESSION: 1. Moderate size infarct of the anterior septal wall with mild peri-infarct ischemia.  2. Moderate sized region of moderate reversible ischemia in the mid and basilar segments of the inferolateral wall.  .  2. Dyskinesia of the anterior septal wall.  3. Left ventricular ejection fraction 56%  4. High -risk stress test findings*.  2D Echo 10/22/14 Study Conclusions  - Left ventricle: The cavity size was normal. Wall thickness was normal. Systolic function was normal. The estimated  ejection fraction was in the range of 55% to 60%. Wall motion was normal; there were no regional wall motion abnormalities. Doppler parameters are consistent with abnormal left ventricular relaxation (grade 1 diastolic dysfunction). - Ventricular septum: Septal motion showed paradox. These changes are consistent with a left bundle branch block. - Aortic valve: There was trivial regurgitation.   Assessment/Plan  Principal Problem:   Anginal pain Active Problems:   Essential hypertension   Chronic renal disease, stage III   DJD (degenerative joint disease), thoracic   Abnormal nuclear cardiac imaging test   Carotid bruit-Rt   GERD (gastroesophageal reflux disease)   Type 2 diabetes with stage 3 chronic kidney disease GFR 30-59    1. Unstable Angina: cardiac enzymes negative. NST "high risk". Plan for [facility] +/- PCI today. Continue ASA, metoprolol and Lipitor. No ACE/ARB given CKD.   2. CKD, Stage III: SCr 1.78 today. Baseline ~1.8. Metformin on hold for cath. Will need post cath hydration. F/u BMP in the am.   3. HTN: BP well controlled on amlodipine and metoprolol.   4. DM: BG levels ok. Metformin on hold for cath. Will continue to hold at least 48 hrs post cath. Continue SSI for coverage.      Brittainy M. Sharol Harness, PA-C 10/25/2014 7:42 AM  Agree. See also my note  Thurmon Fair, MD, [facility] [phone] office [phone] pager

## 2014-10-26 ENCOUNTER — Encounter (HOSPITAL_COMMUNITY)
Admission: EM | Disposition: A | Discharge status: Home / Self Care | Payer: Self-pay | Source: Home / Self Care | Attending: Cardiovascular Disease

## 2014-10-26 HISTORY — PX: CARDIAC CATHETERIZATION: SHX172

## 2014-10-26 LAB — BASIC METABOLIC PANEL
ANION GAP: 7 (ref 5–15)
Anion gap: 6 (ref 5–15)
BUN: 29 mg/dL — AB (ref 6–20)
BUN: 26 mg/dL — AB (ref 6–20)
CALCIUM: 8.7 mg/dL — AB (ref 8.9–10.3)
CALCIUM: 9.3 mg/dL (ref 8.9–10.3)
CHLORIDE: 106 mmol/L (ref 101–111)
CO2: 23 mmol/L (ref 22–32)
CO2: 25 mmol/L (ref 22–32)
CREATININE: 1.8 mg/dL — AB (ref 0.61–1.24)
Chloride: 104 mmol/L (ref 101–111)
Creatinine, Ser: 1.94 mg/dL — ABNORMAL HIGH (ref 0.61–1.24)
GFR calc Af Amer: 33 mL/min — ABNORMAL LOW (ref >60)
GFR calc Af Amer: 36 mL/min — ABNORMAL LOW (ref >60)
GFR calc non Af Amer: 29 mL/min — ABNORMAL LOW (ref >60)
GFR calc non Af Amer: 31 mL/min — ABNORMAL LOW (ref >60)
GLUCOSE: 121 mg/dL — AB (ref 65–99)
Glucose, Bld: 104 mg/dL — ABNORMAL HIGH (ref 65–99)
Potassium: 4.2 mmol/L (ref 3.5–5.1)
Potassium: 5 mmol/L (ref 3.5–5.1)
SODIUM: 137 mmol/L (ref 135–145)
Sodium: 134 mmol/L — ABNORMAL LOW (ref 135–145)

## 2014-10-26 LAB — CBC
HCT: 34.3 % — ABNORMAL LOW (ref 39.0–52.0)
HEMOGLOBIN: 11.2 g/dL — AB (ref 13.0–17.0)
MCH: 29.2 pg (ref 26.0–34.0)
MCHC: 32.7 g/dL (ref 30.0–36.0)
MCV: 89.3 fL (ref 78.0–100.0)
Platelets: 178 10*3/uL (ref 150–400)
RBC: 3.84 MIL/uL — ABNORMAL LOW (ref 4.22–5.81)
RDW: 13.9 % (ref 11.5–15.5)
WBC: 8.6 10*3/uL (ref 4.0–10.5)

## 2014-10-26 LAB — GLUCOSE, CAPILLARY
Glucose-Capillary: 115 mg/dL — ABNORMAL HIGH (ref 65–99)
Glucose-Capillary: 80 mg/dL (ref 65–99)
Glucose-Capillary: 70 mg/dL (ref 65–99)
Glucose-Capillary: 98 mg/dL (ref 65–99)

## 2014-10-26 LAB — POCT ACTIVATED CLOTTING TIME
ACTIVATED CLOTTING TIME: 319 s
ACTIVATED CLOTTING TIME: 270 s
ACTIVATED CLOTTING TIME: 325 s

## 2014-10-26 SURGERY — CORONARY STENT INTERVENTION
Anesthesia: LOCAL

## 2014-10-26 MED ORDER — CLOPIDOGREL BISULFATE 75 MG PO TABS
ORAL_TABLET | ORAL | Status: AC
  Filled 2014-10-26: qty 1

## 2014-10-26 MED ORDER — FENTANYL CITRATE (PF) 100 MCG/2ML IJ SOLN
INTRAMUSCULAR | Status: DC | PRN
  Administered 2014-10-26: 25 ug via INTRAVENOUS

## 2014-10-26 MED ORDER — BIVALIRUDIN 250 MG IV SOLR
INTRAVENOUS | Status: AC
  Filled 2014-10-26: qty 250

## 2014-10-26 MED ORDER — SODIUM CHLORIDE 0.9 % IV SOLN
250.0000 mg | INTRAVENOUS | Status: DC | PRN
  Administered 2014-10-26: 1 mg/kg/h via INTRAVENOUS

## 2014-10-26 MED ORDER — MIDAZOLAM HCL 2 MG/2ML IJ SOLN
INTRAMUSCULAR | Status: AC
  Filled 2014-10-26: qty 4

## 2014-10-26 MED ORDER — LIVING WELL WITH DIABETES BOOK
Freq: Once | Status: AC
  Administered 2014-10-26: 22:00:00
  Filled 2014-10-26: qty 1

## 2014-10-26 MED ORDER — IOHEXOL 350 MG/ML SOLN
INTRAVENOUS | Status: DC | PRN
  Administered 2014-10-26: 170 mL via INTRA_ARTERIAL

## 2014-10-26 MED ORDER — NITROGLYCERIN 1 MG/10 ML FOR IR/CATH LAB
INTRA_ARTERIAL | Status: DC | PRN
  Administered 2014-10-26: 200 ug via INTRA_ARTERIAL
  Administered 2014-10-26: 100 ug via INTRA_ARTERIAL
  Administered 2014-10-26: 100 ug via INTRACORONARY

## 2014-10-26 MED ORDER — FENTANYL CITRATE (PF) 100 MCG/2ML IJ SOLN
INTRAMUSCULAR | Status: AC
  Filled 2014-10-26: qty 4

## 2014-10-26 MED ORDER — SODIUM CHLORIDE 0.9 % IJ SOLN: 3.0000 mL | Freq: Two times a day (BID) | INTRAMUSCULAR | Status: DC

## 2014-10-26 MED ORDER — SODIUM CHLORIDE 0.9 % IV SOLN: 250.0000 mL | INTRAVENOUS | Status: DC | PRN

## 2014-10-26 MED ORDER — NITROGLYCERIN 1 MG/10 ML FOR IR/CATH LAB
INTRA_ARTERIAL | Status: AC
  Filled 2014-10-26: qty 10

## 2014-10-26 MED ORDER — CLOPIDOGREL BISULFATE 75 MG PO TABS
ORAL_TABLET | ORAL | Status: DC | PRN
  Administered 2014-10-26: 75 mg via ORAL

## 2014-10-26 MED ORDER — SODIUM CHLORIDE 0.9 % IV SOLN
INTRAVENOUS | Status: AC
  Administered 2014-10-26: 17:00:00 via INTRAVENOUS

## 2014-10-26 MED ORDER — ANGIOPLASTY BOOK
Freq: Once | Status: AC
  Administered 2014-10-26: 22:00:00
  Filled 2014-10-26: qty 1

## 2014-10-26 MED ORDER — LIDOCAINE HCL (PF) 1 % IJ SOLN
INTRAMUSCULAR | Status: AC
  Filled 2014-10-26: qty 30

## 2014-10-26 MED ORDER — MIDAZOLAM HCL 2 MG/2ML IJ SOLN
INTRAMUSCULAR | Status: DC | PRN
  Administered 2014-10-26 (×2): 1 mg via INTRAVENOUS

## 2014-10-26 MED ORDER — SODIUM CHLORIDE 0.9 % IJ SOLN: 3.0000 mL | INTRAMUSCULAR | Status: DC | PRN

## 2014-10-26 MED ORDER — VERAPAMIL HCL 2.5 MG/ML IV SOLN
INTRAVENOUS | Status: AC
  Filled 2014-10-26: qty 2

## 2014-10-26 MED ORDER — BIVALIRUDIN BOLUS VIA INFUSION - CUPID
INTRAVENOUS | Status: DC | PRN
  Administered 2014-10-26: 60.3 mg via INTRAVENOUS

## 2014-10-26 MED FILL — Nitroglycerin IV Soln 100 MCG/ML in D5W: INTRA_ARTERIAL | Qty: 10 | Status: AC

## 2014-10-26 SURGICAL SUPPLY — 26 items
BALLN MINITREK RX 2.0X12 (BALLOONS) ×2
BALLN ~~LOC~~ EMERGE MR 2.25X15 (BALLOONS) ×2
BALLN ~~LOC~~ EMERGE MR 2.5X12 (BALLOONS) ×2
BALLN ~~LOC~~ EMERGE MR 2.75X15 (BALLOONS) ×2
BALLOON MINITREK RX 2.0X12 (BALLOONS) IMPLANT
BALLOON ~~LOC~~ EMERGE MR 2.25X15 (BALLOONS) IMPLANT
BALLOON ~~LOC~~ EMERGE MR 2.5X12 (BALLOONS) IMPLANT
BALLOON ~~LOC~~ EMERGE MR 2.75X15 (BALLOONS) IMPLANT
CATH INFINITI JR4 5F (CATHETERS) ×1 IMPLANT
CATH VISTA GUIDE 6FR XBLAD3.5 (CATHETERS) ×2 IMPLANT
DEVICE WIRE ANGIOSEAL 6FR (Vascular Products) ×1 IMPLANT
GLIDESHEATH SLEND SS 6F .021 (SHEATH) ×1 IMPLANT
GUIDEWIRE ANGLED .035X150CM (WIRE) ×1 IMPLANT
KIT ENCORE 26 ADVANTAGE (KITS) ×2 IMPLANT
KIT HEART LEFT (KITS) ×2 IMPLANT
PACK CARDIAC CATHETERIZATION (CUSTOM PROCEDURE TRAY) ×2 IMPLANT
SHEATH PINNACLE 6F 10CM (SHEATH) ×1 IMPLANT
STENT SYNERGY DES 2.25X16 (Permanent Stent) ×1 IMPLANT
STENT SYNERGY DES 2.25X20 (Permanent Stent) ×1 IMPLANT
STENT SYNERGY DES 2.5X24 (Permanent Stent) ×1 IMPLANT
TRANSDUCER W/STOPCOCK (MISCELLANEOUS) ×2 IMPLANT
TUBING CIL FLEX 10 FLL-RA (TUBING) ×2 IMPLANT
WIRE COUGAR XT STRL 190CM (WIRE) ×2 IMPLANT
WIRE HI TORQ VERSACORE-J 145CM (WIRE) ×1 IMPLANT
WIRE HI TORQ WHISPER MS 190CM (WIRE) ×1 IMPLANT
WIRE SAFE-T 1.5MM-J .035X260CM (WIRE) ×1 IMPLANT

## 2014-10-26 NOTE — Progress Notes (Signed)
Patient Name: [name] Date of Encounter: 10/26/2014  Principal Problem:   Anginal pain Active Problems:   Essential hypertension   Chronic renal disease, stage III   DJD (degenerative joint disease), thoracic   Abnormal nuclear cardiac imaging test   Carotid bruit-Rt   GERD (gastroesophageal reflux disease)   Type 2 diabetes with stage 3 chronic kidney disease GFR 30-59   Coronary artery disease involving native coronary artery of native heart with unstable angina pectoris   CAD (coronary artery disease)   Length of Stay: 1  SUBJECTIVE  No problems at cath site. Creat minimally changed at 1.9. For staged PCI today.  CURRENT MEDS . amLODipine  5 mg Oral Daily  . aspirin EC  81 mg Oral Daily  . atorvastatin  40 mg Oral q1800  . cholecalciferol  1,000 Units Oral Daily  . clopidogrel  75 mg Oral Q breakfast  . docusate sodium  100 mg Oral Daily  . insulin aspart  0-15 Units Subcutaneous TID WC  . insulin aspart  0-5 Units Subcutaneous QHS  . metoprolol tartrate  25 mg Oral BID  . omega-3 acid ethyl esters  1 g Oral Daily  . sodium chloride  3 mL Intravenous Q12H  . sodium chloride  3 mL Intravenous Q12H    OBJECTIVE   Intake/Output Summary (Last 24 hours) at 10/26/14 1018 Last data filed at 10/26/14 0342  Gross per 24 hour  Intake    240 ml  Output    600 ml  Net   -360 ml   Filed Weights   10/24/14 0458 10/25/14 0500 10/26/14 0611  Weight: 176 lb (79.833 kg) 176 lb 8 oz (80.06 kg) 177 lb 4.8 oz (80.423 kg)    PHYSICAL EXAM Filed Vitals:   10/25/14 1435 10/25/14 1505 10/25/14 2030 10/26/14 0611  BP: 107/58 105/54 124/55 141/73  Pulse:   79 67  Temp:   98 F (36.7 C) 97.8 F (36.6 C)  TempSrc:   Oral Oral  Resp:   18 16  Height:      Weight:    177 lb 4.8 oz (80.423 kg)  SpO2: 100%  95% 100%   General: Alert, oriented x3, no distress Head: no evidence of trauma, PERRL, EOMI, no exophtalmos or lid lag, no myxedema, no xanthelasma; normal ears,  nose and oropharynx Neck: normal jugular venous pulsations and no hepatojugular reflux; brisk carotid pulses without delay and no carotid bruits Chest: clear to auscultation, no signs of consolidation by percussion or palpation, normal fremitus, symmetrical and full respiratory excursions Cardiovascular: normal position and quality of the apical impulse, regular rhythm, normal first and second heart sounds, no rubs or gallops, 1/6 systolic ejection murmur Abdomen: no tenderness or distention, no masses by palpation, no abnormal pulsatility or arterial bruits, normal bowel sounds, no hepatosplenomegaly Extremities: no clubbing, cyanosis or edema; 2+ radial, ulnar and brachial pulses bilaterally; 2+ right femoral, posterior tibial and dorsalis pedis pulses; 2+ left femoral, posterior tibial and dorsalis pedis pulses; no subclavian or femoral bruits Neurological: grossly nonfocal  LABS  CBC  Recent Labs  10/25/14 0448 10/26/14 0315  WBC 7.5 8.6  HGB 11.7* 11.2*  HCT 36.1* 34.3*  MCV 89.6 89.3  PLT 170 178   Basic Metabolic Panel  Recent Labs  10/25/14 0448 10/26/14 0315  NA 136 134*  K 4.4 4.2  CL 106 104  CO2 24 23  GLUCOSE 115* 121*  BUN 31* 29*  CREATININE 1.78* 1.94*  CALCIUM 9.0 8.7*  Hemoglobin A1C  Recent Labs  10/24/14 0303  HGBA1C 6.1*    Radiology Studies Imaging results have been reviewed and No results found.  TELE NSR   ASSESSMENT AND PLAN  1. Unstable Angina: For staged PCI to LAD and LCX today. LAD seems most important target by both functional study and anatomical findings. If LAD PCI requires more contract than expected, can leave LCX lesion for a future date. Continue ASA, metoprolol and Lipitor. No ACE/ARB given CKD.   2. CKD, Stage III: SCr 1.94 today. Baseline ~1.8. Metformin on hold for cath. Will need post cath hydration. F/u BMP in the am.   3. HTN: BP well controlled on amlodipine and metoprolol.   4. DM: BG levels ok. Metformin on  hold for cath. Will continue to hold at least 48 hrs post cath. Continue SSI for coverage.     Thurmon Fair, MD, [facility] [phone] office [phone] pager 10/26/2014 10:18 AM

## 2014-10-26 NOTE — Progress Notes (Signed)
CSW (Clinical Child psychotherapist) notified pt admitted from [facility]. CSW visited pt room and confirmed with pt he is from [facility]. Pt has no current hospital social work needs. CSW signing off.  Syrena Burges, LCSWA [phone]

## 2014-10-26 NOTE — Progress Notes (Addendum)
Assumed care from off going RN @ 0730; patient alert & oriented; no c/o's pain; no signs of acute distress; NPO ; possible cath? ; will continue to monitor patient   patient going down to cath lab; tele off;   Patient will not be returning to this floor; personal belongings was given to brother-in-law

## 2014-10-26 NOTE — Interval H&P Note (Signed)
History and Physical Interval Note:  10/26/2014 2:23 PM  [name]  has presented today for percutaneous coronary intervention of the Circumflex and LAD with the diagnosis of unstable angina.  The various methods of treatment have been discussed with the patient and family. After consideration of risks, benefits and other options for treatment, the patient has consented to  Procedure(s): Coronary Stent Intervention (N/A) as a surgical intervention .  The patient's history has been reviewed, patient examined, no change in status, stable for surgery.  I have reviewed the patient's chart and labs.  Questions were answered to the patient's satisfaction.  \  Cath Lab Visit (complete for each Cath Lab visit)  Clinical Evaluation Leading to the Procedure:   ACS: No.  Non-ACS:    Anginal Classification: CCS III  Anti-ischemic medical therapy: Minimal Therapy (1 class of medications)  Non-Invasive Test Results: High-risk stress test findings: cardiac mortality >3%/year  Prior CABG: No previous CABG         [name]

## 2014-10-26 NOTE — H&P (View-Only) (Signed)
Patient Name: [name] Date of Encounter: 10/26/2014  Principal Problem:   Anginal pain Active Problems:   Essential hypertension   Chronic renal disease, stage III   DJD (degenerative joint disease), thoracic   Abnormal nuclear cardiac imaging test   Carotid bruit-Rt   GERD (gastroesophageal reflux disease)   Type 2 diabetes with stage 3 chronic kidney disease GFR 30-59   Coronary artery disease involving native coronary artery of native heart with unstable angina pectoris   CAD (coronary artery disease)   Length of Stay: 1  SUBJECTIVE  No problems at cath site. Creat minimally changed at 1.9. For staged PCI today.  CURRENT MEDS . amLODipine  5 mg Oral Daily  . aspirin EC  81 mg Oral Daily  . atorvastatin  40 mg Oral q1800  . cholecalciferol  1,000 Units Oral Daily  . clopidogrel  75 mg Oral Q breakfast  . docusate sodium  100 mg Oral Daily  . insulin aspart  0-15 Units Subcutaneous TID WC  . insulin aspart  0-5 Units Subcutaneous QHS  . metoprolol tartrate  25 mg Oral BID  . omega-3 acid ethyl esters  1 g Oral Daily  . sodium chloride  3 mL Intravenous Q12H  . sodium chloride  3 mL Intravenous Q12H    OBJECTIVE   Intake/Output Summary (Last 24 hours) at 10/26/14 1018 Last data filed at 10/26/14 0342  Gross per 24 hour  Intake    240 ml  Output    600 ml  Net   -360 ml   Filed Weights   10/24/14 0458 10/25/14 0500 10/26/14 0611  Weight: 176 lb (79.833 kg) 176 lb 8 oz (80.06 kg) 177 lb 4.8 oz (80.423 kg)    PHYSICAL EXAM Filed Vitals:   10/25/14 1435 10/25/14 1505 10/25/14 2030 10/26/14 0611  BP: 107/58 105/54 124/55 141/73  Pulse:   79 67  Temp:   98 F (36.7 C) 97.8 F (36.6 C)  TempSrc:   Oral Oral  Resp:   18 16  Height:      Weight:    177 lb 4.8 oz (80.423 kg)  SpO2: 100%  95% 100%   General: Alert, oriented x3, no distress Head: no evidence of trauma, PERRL, EOMI, no exophtalmos or lid lag, no myxedema, no xanthelasma; normal ears,  nose and oropharynx Neck: normal jugular venous pulsations and no hepatojugular reflux; brisk carotid pulses without delay and no carotid bruits Chest: clear to auscultation, no signs of consolidation by percussion or palpation, normal fremitus, symmetrical and full respiratory excursions Cardiovascular: normal position and quality of the apical impulse, regular rhythm, normal first and second heart sounds, no rubs or gallops, 1/6 systolic ejection murmur Abdomen: no tenderness or distention, no masses by palpation, no abnormal pulsatility or arterial bruits, normal bowel sounds, no hepatosplenomegaly Extremities: no clubbing, cyanosis or edema; 2+ radial, ulnar and brachial pulses bilaterally; 2+ right femoral, posterior tibial and dorsalis pedis pulses; 2+ left femoral, posterior tibial and dorsalis pedis pulses; no subclavian or femoral bruits Neurological: grossly nonfocal  LABS  CBC  Recent Labs  10/25/14 0448 10/26/14 0315  WBC 7.5 8.6  HGB 11.7* 11.2*  HCT 36.1* 34.3*  MCV 89.6 89.3  PLT 170 178   Basic Metabolic Panel  Recent Labs  10/25/14 0448 10/26/14 0315  NA 136 134*  K 4.4 4.2  CL 106 104  CO2 24 23  GLUCOSE 115* 121*  BUN 31* 29*  CREATININE 1.78* 1.94*  CALCIUM 9.0 8.7*  Hemoglobin A1C  Recent Labs  10/24/14 0303  HGBA1C 6.1*    Radiology Studies Imaging results have been reviewed and No results found.  TELE NSR   ASSESSMENT AND PLAN  1. Unstable Angina: For staged PCI to LAD and LCX today. LAD seems most important target by both functional study and anatomical findings. If LAD PCI requires more contract than expected, can leave LCX lesion for a future date. Continue ASA, metoprolol and Lipitor. No ACE/ARB given CKD.   2. CKD, Stage III: SCr 1.94 today. Baseline ~1.8. Metformin on hold for cath. Will need post cath hydration. F/u BMP in the am.   3. HTN: BP well controlled on amlodipine and metoprolol.   4. DM: BG levels ok. Metformin on  hold for cath. Will continue to hold at least 48 hrs post cath. Continue SSI for coverage.     Thurmon Fair, MD, [facility] [phone] office [phone] pager 10/26/2014 10:18 AM

## 2014-10-27 ENCOUNTER — Other Ambulatory Visit: Payer: Self-pay | Admitting: Cardiology

## 2014-10-27 ENCOUNTER — Encounter (HOSPITAL_COMMUNITY): Payer: Self-pay | Admitting: Cardiovascular Disease

## 2014-10-27 DIAGNOSIS — I200 Unstable angina: Secondary | ICD-10-CM

## 2014-10-27 DIAGNOSIS — Z9861 Coronary angioplasty status: Secondary | ICD-10-CM

## 2014-10-27 DIAGNOSIS — I2510 Atherosclerotic heart disease of native coronary artery without angina pectoris: Secondary | ICD-10-CM

## 2014-10-27 LAB — CBC
HCT: 35 % — ABNORMAL LOW (ref 39.0–52.0)
HEMOGLOBIN: 11.7 g/dL — AB (ref 13.0–17.0)
MCH: 29.6 pg (ref 26.0–34.0)
MCHC: 33.4 g/dL (ref 30.0–36.0)
MCV: 88.6 fL (ref 78.0–100.0)
Platelets: 173 10*3/uL (ref 150–400)
RBC: 3.95 MIL/uL — AB (ref 4.22–5.81)
RDW: 13.6 % (ref 11.5–15.5)
WBC: 11.5 10*3/uL — ABNORMAL HIGH (ref 4.0–10.5)

## 2014-10-27 LAB — BASIC METABOLIC PANEL
ANION GAP: 6 (ref 5–15)
BUN: 25 mg/dL — ABNORMAL HIGH (ref 6–20)
CALCIUM: 9.1 mg/dL (ref 8.9–10.3)
CHLORIDE: 105 mmol/L (ref 101–111)
CO2: 23 mmol/L (ref 22–32)
Creatinine, Ser: 1.8 mg/dL — ABNORMAL HIGH (ref 0.61–1.24)
GFR calc non Af Amer: 31 mL/min — ABNORMAL LOW (ref >60)
GFR, EST AFRICAN AMERICAN: 36 mL/min — AB (ref >60)
Glucose, Bld: 107 mg/dL — ABNORMAL HIGH (ref 65–99)
Potassium: 4.7 mmol/L (ref 3.5–5.1)
Sodium: 134 mmol/L — ABNORMAL LOW (ref 135–145)

## 2014-10-27 LAB — GLUCOSE, CAPILLARY: Glucose-Capillary: 111 mg/dL — ABNORMAL HIGH (ref 65–99)

## 2014-10-27 MED ORDER — ACETAMINOPHEN 325 MG PO TABS: 650.0000 mg | ORAL_TABLET | ORAL | Status: DC | PRN

## 2014-10-27 MED ORDER — METOPROLOL TARTRATE 25 MG PO TABS: 25.0000 mg | ORAL_TABLET | Freq: Two times a day (BID) | ORAL | Status: DC

## 2014-10-27 MED ORDER — ATORVASTATIN CALCIUM 40 MG PO TABS: 40.0000 mg | ORAL_TABLET | Freq: Every day | ORAL | Status: DC

## 2014-10-27 MED ORDER — CLOPIDOGREL BISULFATE 75 MG PO TABS: 75.0000 mg | ORAL_TABLET | Freq: Every day | ORAL | Status: DC

## 2014-10-27 MED ORDER — METFORMIN HCL ER 500 MG PO TB24: 500.0000 mg | ORAL_TABLET | Freq: Every morning | ORAL | Status: AC

## 2014-10-27 MED ORDER — NITROGLYCERIN 0.4 MG SL SUBL: 0.4000 mg | SUBLINGUAL_TABLET | SUBLINGUAL | Status: DC | PRN

## 2014-10-27 MED FILL — Lidocaine HCl Local Preservative Free (PF) Inj 1%: INTRAMUSCULAR | Qty: 30 | Status: AC

## 2014-10-27 MED FILL — Verapamil HCl IV Soln 2.5 MG/ML: INTRAVENOUS | Qty: 2 | Status: AC

## 2014-10-27 NOTE — Discharge Instructions (Signed)
Coronary Angiogram With Stent, Care After °Refer to this sheet in the next few weeks. These instructions provide you with information on caring for yourself after your procedure. Your health care provider may also give you more specific instructions. Your treatment has been planned according to current medical practices, but problems sometimes occur. Call your health care provider if you have any problems or questions after your procedure.  °WHAT TO EXPECT AFTER THE PROCEDURE  °The insertion site may be tender for a few days after your procedure. °HOME CARE INSTRUCTIONS  °· Take medicines only as directed by your health care provider. Blood thinners may be prescribed after your procedure to improve blood flow through the stent. °· Change any bandages (dressings) as directed by your health care provider.   °· Check your insertion site every day for redness, swelling, or fluid leaking from the insertion.   °· Do not take baths, swim, or use a hot tub until your health care provider approves. You may shower. Pat the insertion area dry. Do not rub the insertion area with a washcloth or towel.   °· Eat a heart-healthy diet. This should include plenty of fresh fruits and vegetables. Meat should be lean cuts. Avoid the following types of food:   °¨ Food that is high in salt.   °¨ Canned or highly processed food.   °¨ Food that is high in saturated fat or sugar.   °¨ Fried food.   °· Make any other lifestyle changes recommended by your health care provider. This may include:   °¨ Not using any tobacco products including cigarettes, chewing tobacco, or electronic cigarettes.  °¨ Managing your weight.   °¨ Getting regular exercise.   °¨ Managing your blood pressure.   °¨ Limiting your alcohol intake.   °¨ Managing other health problems, such as diabetes.   °· If you need an MRI after your heart stent was placed, be sure to tell the health care provider who orders the MRI that you have a heart stent.   °· Keep all follow-up  visits as directed by your health care provider.   °SEEK IMMEDIATE MEDICAL CARE IF:  °· You develop chest pain, shortness of breath, feel faint, or pass out. °· You have bleeding, swelling larger than a walnut, or drainage from the catheter insertion site. °· You develop pain, discoloration, coldness, or severe bruising in the leg or arm that held the catheter. °· You develop bleeding from any other place such as from the bowels. There may be bright red blood in the urine or stools, or it may appear as black, tarry stools. °· You have a fever or chills. °MAKE SURE YOU: °· Understand these instructions. °· Will watch your condition. °· Will get help right away if you are not doing well or get worse. °Document Released: 08/24/2004 Document Revised: 06/21/2013 Document Reviewed: 07/08/2012 °Exit[employer]® Patient Information ©2015 [employer]. This information is not intended to replace advice given to you by your health care provider. Make sure you discuss any questions you have with your health care provider. ° °

## 2014-10-27 NOTE — Progress Notes (Signed)
CARDIAC REHAB PHASE I   PRE:  Rate/Rhythm: 64 SR with PVC    BP: sitting 143/66    SaO2:   MODE:  Ambulation: 1000 ft   POST:  Rate/Rhythm: 84 SR with PVC    BP: sitting 170/56     SaO2:   Tolerated well, no c/o. Slightly wobbly but not LOB. Ed completed. Pt enjoys ex class where he lives. Walks his dog. Discussed Plavix, ASA and NTG. Voiced understanding and requests his referral be sent to [facility]. [id number]   Harriet Masson CES, ACSM 10/27/2014 8:47 AM

## 2014-10-27 NOTE — Progress Notes (Signed)
TR BAND REMOVAL  LOCATION:    right radial  DEFLATED PER PROTOCOL:    Yes.    TIME BAND OFF / DRESSING APPLIED:    21:15   SITE UPON ARRIVAL:    Level 0  SITE AFTER BAND REMOVAL:    Level 0  REVERSE ALLEN'S TEST:     positive  CIRCULATION SENSATION AND MOVEMENT:    Within Normal Limits   Yes.    COMMENTS:    

## 2014-10-27 NOTE — Progress Notes (Signed)
Subjective:  Ambulating without problem  Objective:  Vital Signs in the last 24 hours: Temp:  [97.4 F (36.3 C)-98 F (36.7 C)] 98 F (36.7 C) (09/08 0136) Pulse Rate:  [0-99] 65 (09/08 0400) Resp:  [0-26] 19 (09/08 0400) BP: (97-180)/(52-89) 131/60 mmHg (09/08 0400) SpO2:  [0 %-100 %] 98 % (09/08 0400) Weight:  [180 lb 8.9 oz (81.9 kg)] 180 lb 8.9 oz (81.9 kg) (09/08 0136)  Intake/Output from previous day:  Intake/Output Summary (Last 24 hours) at 10/27/14 0806 Last data filed at 10/27/14 0400  Gross per 24 hour  Intake    665 ml  Output   1750 ml  Net  -1085 ml    Physical Exam: General appearance: alert, cooperative and no distress Neck: no JVD Lungs: clear to auscultation bilaterally Heart: regular rate and rhythm Extremities: Rt radial site without hematoma Skin: Skin color, texture, turgor normal. No rashes or lesions Neurologic: Grossly normal   Rate: 66  Rhythm: normal sinus rhythm, premature ventricular contractions (PVC) and sinus pause with junctional escape beat  Lab Results:  Recent Labs  10/26/14 0315 10/27/14 0315  WBC 8.6 11.5*  HGB 11.2* 11.7*  PLT 178 173    Recent Labs  10/26/14 1145 10/27/14 0315  NA 137 134*  K 5.0 4.7  CL 106 105  CO2 25 23  GLUCOSE 104* 107*  BUN 26* 25*  CREATININE 1.80* 1.80*   No results for input(s): TROPONINI in the last 72 hours.  Invalid input(s): CK, MB  Recent Labs  10/25/14 0448  INR 1.09    Scheduled Meds: . amLODipine  5 mg Oral Daily  . aspirin EC  81 mg Oral Daily  . atorvastatin  40 mg Oral q1800  . cholecalciferol  1,000 Units Oral Daily  . clopidogrel  75 mg Oral Q breakfast  . docusate sodium  100 mg Oral Daily  . insulin aspart  0-15 Units Subcutaneous TID WC  . insulin aspart  0-5 Units Subcutaneous QHS  . metoprolol tartrate  25 mg Oral BID  . omega-3 acid ethyl esters  1 g Oral Daily  . sodium chloride  3 mL Intravenous Q12H   Continuous Infusions:  PRN Meds:.sodium  chloride, acetaminophen, nitroGLYCERIN, ondansetron (ZOFRAN) IV, sodium chloride   Imaging: Imaging results have been reviewed  Cardiac Studies:  Assessment/Plan 81 y.o. (looks younger)male with a history of DM, HTN, CRI followed by Dr Pete Glatter. He presented 10/21/14 with [place]. Troponin negative for MI. He underwent Myoview which was "high risk". He was hydrated and underwent angiogram 10/26/14 which revealed CFX and LAD disease. He had DES placed. LVF in normal. SCr post PCI was the same as pre PCI- 1.8.   Principal Problem:   Anginal pain Active Problems:   Abnormal nuclear cardiac imaging test   CAD -S/P LAD and CFX DES 10/26/14   Essential hypertension   Chronic renal disease, stage III   Type 2 diabetes with stage 3 chronic kidney disease GFR 30-59   DJD (degenerative joint disease), thoracic   Carotid bruit-Rt-dopplers OK   GERD (gastroesophageal reflux disease)   PLAN: He will need f/u BMP Monday, should be OK for discharge later today. He was on Lisinopril before admission and this has been stopped. He was also on Metformin prior to admission, will hold for 48 hrs.   Corine Shelter PA-C 10/27/2014, 8:06 AM [phone] I have seen and examined the patient along with Corine Shelter PA-C.  I have reviewed the chart, notes and  new data.  I agree with PA's note.  Key new complaints: feels great, able to ambulate in hall without difficulty Key examination changes: no cath access site problems Key new findings / data: creatinine at baseline  PLAN: Mandatory dual antiplatelet therapy reviewed. Reevaluate renal function in a few days. DC today.  Thurmon Fair, MD, [facility] [phone] 10/27/2014, 9:14 AM

## 2014-10-27 NOTE — Discharge Summary (Signed)
Patient ID: [name],  MRN: [id number], DOB/AGE: 11/14/1922 81 y.o.  Admit date: 10/21/2014 Discharge date: 10/27/2014  Primary Care Provider: Ginette Otto, MD Primary Cardiologist: Dr Royann Shivers  Discharge Diagnoses Principal Problem:   Anginal pain Active Problems:   Abnormal nuclear cardiac imaging test   CAD -S/P LAD and CFX DES 10/26/14   Essential hypertension   Chronic renal disease, stage III   Type 2 diabetes with stage 3 chronic kidney disease GFR 30-59   DJD (degenerative joint disease), thoracic   Carotid bruit-Rt-dopplers OK   GERD (gastroesophageal reflux disease)    Procedures:  Myoview 10/22/14                         Coronary angiogram 10/25/14                         Staged PCI with LAD and CFX DES 10/26/14   Hospital Course:  81 y.o. male with a history of DM, HTN, CRI-3, and GERD, otherwise healthy male and who presented to [facility] ED 10/21/14 for evaluation of chest pain.           Patient was in usual health of state up until the afternoon of admission when he developed CP. The patient was out with his friends at lunch. He was notified that his wife fell after coming from her hair appointment. He ran about 1-1/2 blocks to reach her and then he developed L upper chest tightness that radiated to Left jaw and L shoulder. He was admitted and ruled out for an MI. Lexiscan Myoview was done 10/22/14 and was read as "high risk". Echo showed normal LVF. He did have stage 3B CRI. He was hydrated and had angiogram on 10/25/14 that revealed high grade CFX and LAD disease. He was hydrated overnight and underwent LAD and CFX DES placement. His SCr post procedure is stable- 1.8. He was on an ACE prior to admission and this will be stopped. He can resume his Metformin in 48 hrs. He will need a BMP Monday after discharge, we will arrange TOC f/u.   Discharge Vitals:  Blood pressure 131/60, pulse 65, temperature 98 F (36.7 C), temperature source Oral, resp. rate 19, height 5'  10" (1.778 m), weight 180 lb 8.9 oz (81.9 kg), SpO2 98 %.    Labs: Results for orders placed or performed during the hospital encounter of 10/21/14 (from the past 24 hour(s))  Basic metabolic panel     Status: Abnormal   Collection Time: 10/26/14 11:45 AM  Result Value Ref Range   Sodium 137 135 - 145 mmol/L   Potassium 5.0 3.5 - 5.1 mmol/L   Chloride 106 101 - 111 mmol/L   CO2 25 22 - 32 mmol/L   Glucose, Bld 104 (H) 65 - 99 mg/dL   BUN 26 (H) 6 - 20 mg/dL   Creatinine, Ser 2.13 (H) 0.61 - 1.24 mg/dL   Calcium 9.3 8.9 - 08.6 mg/dL   GFR calc non Af Amer 31 (L) >60 mL/min   GFR calc Af Amer 36 (L) >60 mL/min   Anion gap 6 5 - 15  Glucose, capillary     Status: None   Collection Time: 10/26/14 11:56 AM  Result Value Ref Range   Glucose-Capillary 80 65 - 99 mg/dL   Comment 1 Notify RN    Comment 2 Document in Chart   POCT Activated clotting time  Status: None   Collection Time: 10/26/14  2:59 PM  Result Value Ref Range   Activated Clotting Time 319 seconds  Glucose, capillary     Status: None   Collection Time: 10/26/14  5:07 PM  Result Value Ref Range   Glucose-Capillary 70 65 - 99 mg/dL  POCT Activated clotting time     Status: None   Collection Time: 10/26/14  5:58 PM  Result Value Ref Range   Activated Clotting Time 270 seconds  POCT Activated clotting time     Status: None   Collection Time: 10/26/14  6:20 PM  Result Value Ref Range   Activated Clotting Time 325 seconds  Glucose, capillary     Status: None   Collection Time: 10/26/14  9:44 PM  Result Value Ref Range   Glucose-Capillary 98 65 - 99 mg/dL   Comment 1 Notify RN    Comment 2 Document in Chart   Basic metabolic panel     Status: Abnormal   Collection Time: 10/27/14  3:15 AM  Result Value Ref Range   Sodium 134 (L) 135 - 145 mmol/L   Potassium 4.7 3.5 - 5.1 mmol/L   Chloride 105 101 - 111 mmol/L   CO2 23 22 - 32 mmol/L   Glucose, Bld 107 (H) 65 - 99 mg/dL   BUN 25 (H) 6 - 20 mg/dL   Creatinine,  Ser 1.61 (H) 0.61 - 1.24 mg/dL   Calcium 9.1 8.9 - 09.6 mg/dL   GFR calc non Af Amer 31 (L) >60 mL/min   GFR calc Af Amer 36 (L) >60 mL/min   Anion gap 6 5 - 15  CBC     Status: Abnormal   Collection Time: 10/27/14  3:15 AM  Result Value Ref Range   WBC 11.5 (H) 4.0 - 10.5 K/uL   RBC 3.95 (L) 4.22 - 5.81 MIL/uL   Hemoglobin 11.7 (L) 13.0 - 17.0 g/dL   HCT 04.5 (L) 40.9 - 81.1 %   MCV 88.6 78.0 - 100.0 fL   MCH 29.6 26.0 - 34.0 pg   MCHC 33.4 30.0 - 36.0 g/dL   RDW 91.4 78.2 - 95.6 %   Platelets 173 150 - 400 K/uL  Glucose, capillary     Status: Abnormal   Collection Time: 10/27/14  6:31 AM  Result Value Ref Range   Glucose-Capillary 111 (H) 65 - 99 mg/dL   Comment 1 Notify RN     Disposition:      Follow-up Information    Follow up with CROITORU,MIHAI, MD.   Specialty:  Cardiology   Why:  office will contact you   Contact information:   [address] [place] [address] [phone]       Discharge Medications:    Medication List    STOP taking these medications        cholecalciferol 1000 UNITS tablet  Commonly known as:  VITAMIN D     lisinopril 5 MG tablet  Commonly known as:  PRINIVIL,ZESTRIL      TAKE these medications        acetaminophen 325 MG tablet  Commonly known as:  TYLENOL  Take 2 tablets (650 mg total) by mouth every 4 (four) hours as needed for headache or mild pain.     amLODipine 5 MG tablet  Commonly known as:  NORVASC  Take 5 mg by mouth daily.     aspirin 81 MG tablet  Take 81 mg by mouth daily.  atorvastatin 40 MG tablet  Commonly known as:  LIPITOR  Take 1 tablet (40 mg total) by mouth daily at 6 PM.     clopidogrel 75 MG tablet  Commonly known as:  PLAVIX  Take 1 tablet (75 mg total) by mouth daily with breakfast.     docusate sodium 100 MG capsule  Commonly known as:  COLACE  Take 100 mg by mouth daily.     fish oil-omega-3 fatty acids 1000 MG capsule  Take 1 g by mouth daily.     hydroxypropyl  methylcellulose / hypromellose 2.5 % ophthalmic solution  Commonly known as:  ISOPTO TEARS / GONIOVISC  Place 1 drop into both eyes daily as needed for dry eyes.     metFORMIN 500 MG 24 hr tablet  Commonly known as:  GLUCOPHAGE-XR  Take 1 tablet (500 mg total) by mouth every morning.  Start taking on:  10/29/2014     metoprolol tartrate 25 MG tablet  Commonly known as:  LOPRESSOR  Take 1 tablet (25 mg total) by mouth 2 (two) times daily.     multivitamin-lutein Caps capsule  Take 1 capsule by mouth daily.     nitroGLYCERIN 0.4 MG SL tablet  Commonly known as:  NITROSTAT  Place 1 tablet (0.4 mg total) under the tongue every 5 (five) minutes x 3 doses as needed for chest pain.         Duration of Discharge Encounter: Greater than 30 minutes including physician time.  Jolene Provost PA-C 10/27/2014 9:26 AM

## 2014-10-28 ENCOUNTER — Telehealth: Payer: Self-pay | Admitting: Cardiovascular Disease

## 2014-10-28 NOTE — Telephone Encounter (Signed)
LMTCB  On home and cell number 10/28/14-ww

## 2014-10-28 NOTE — Telephone Encounter (Signed)
TOC phone call .Marland Kitchen Appt on 11/07/14 at 3:30 w/  Boyce Medici at [employer]

## 2014-10-28 NOTE — Telephone Encounter (Deleted)
Patient contacted regarding discharge from {hospital} on {discharge date}.  Patient understands to follow up with provider {provider} on {date} at {time} at {office location}. Patient understands discharge instructions? {yes or no} Patient understands medications and regiment? {yes or no} Patient understands to bring all medications to this visit? {yes or no}  {Document and additional conversation with patient or press delete}   

## 2014-10-31 NOTE — Telephone Encounter (Signed)
Called and LVM about his appointment on 11/07/14.  Reminded him to bring his medications to appointment and call if he has any questions or concerns about his discharge instructions from the hospital

## 2014-10-31 NOTE — Progress Notes (Signed)
Cardiology Office Note   Date:  11/01/2014   ID:  [name], DOB 11/03/1922, MRN [id number]  PCP:  Ginette Otto, MD  Cardiologist:  Dr. Thurmon Fair   Electrophysiologist:  n/a  Chief Complaint  Patient presents with  . Hospitalization Follow-up    Unstable Angina >> PCI  . Coronary Artery Disease     History of Present Illness: [name] is a 81 y.o. male with a hx of DM2, HTN, GERD.  Admitted 9/2-9/8 with chest pain.  Cardiac enzymes remained normal.  He underwent Lexiscan Myoview which was high risk. He was therefore set up for cardiac catheterization. This revealed high-grade LCx and LAD disease. He was noted to have chronic kidney disease stage III. EF was 50-55% with mild diastolic dysfunction by echocardiogram. He was hydrated overnight and brought back to the cardiac catheterization lab for PCI on 10/26/14. He underwent placement of a Synergy DES to be OM2 and a Synergy DES to the mid LAD and Synergy DES to the distal LAD. Of note, ACE inhibitor was stopped post catheterization given his increased creatinine.  Returns for FU.  He is here today with his wife. He lives at [facility] independent [facility].The patient denies any chest pain, significant dyspnea, syncope, orthopnea, PND, edema.   Studies/Reports Reviewed Today:  LHC 10/26/14 LAD:  prox to mid 20%, mid 90%, dist 90% LCx:  Mid 30%, OM2 ostial 95% RCA:  prox 20%, mid 30%, R post AV branch 20% PCI:  DES to OM2; DES to mid LAD and DES to dist LAD  2nd Mrg lesion, 95% stenosed. There is a 0% residual stenosis post intervention.  A drug-eluting stent was placed.  Dist LAD lesion, 90% stenosed. There is a 0% residual stenosis post intervention.  A drug-eluting stent was placed.  Mid LAD lesion, 90% stenosed. There is a 0% residual stenosis post intervention.  A drug-eluting stent was placed.  1. Unstable angina 2. Severe stenosis mid LAD, now s/p successful PTCA/DES x 2 mid LAD 3. Severe  stenosis mid Circumflex, now s/p successful PTCA DES x 1 mid Circumflex.   Recommendations: He will need DAPT with ASA and Plavix for at least one year. Continue beta blocker and statin. Probable d/c home in am.       LHC 10/25/14  Prox RCA lesion, 20% stenosed.  Mid RCA lesion, 30% stenosed.  Post Atrio lesion, 20% stenosed.  Mid LAD lesion, 90% stenosed.  Dist LAD lesion, 90% stenosed.  2nd Mrg lesion, 95% stenosed.  Mid Cx lesion, 30% stenosed.  Prox LAD to Mid LAD lesion, 20% stenosed.  1. Severe double vessel CAD with severe tandem stenoses mid LAD and severe stenosis OM2.  2. Unstable angina with high risk stress test.   Recommendations: Will hydrate aggressively tonight. Will plan staged PCI of the LAD and OM branch tomorrow afternoon if renal function stable. Plavix 600 mg given x 1 in the cath lab.    Carotid US 10/25/14 Bilateral - 1% to 39% iCA stenosis  Echo 10/22/14 EF 55-60%, normal wall motion, grade 1 diastolic dysfunction, trivial AI  Myoview 10/22/14 IMPRESSION: 1. Moderate size infarct of the anterior septal wall with mild peri-infarct ischemia. 2. Moderate sized region of moderate reversible ischemia in the mid and basilar segments of the inferolateral wall. 2. Dyskinesia of the anterior septal wall. 3. Left ventricular ejection fraction 56% 4. High -risk stress test findings*.   Past Medical History  Diagnosis Date  . Diabetes mellitus without complication  DIET AND WEIGHT CONTROL  . GERD (gastroesophageal reflux disease)   . Essential hypertension 10/22/2014    Past Surgical History  Procedure Laterality Date  . Joint replacement  2007    rt hip   . Hernia repair  1952  . Heller myotomy  12/17/2011    Procedure: LAPAROSCOPIC HELLER MYOTOMY;  Surgeon: Valarie Merino, MD;  Location: [facility];  Service: General;  Laterality: N/A;  . Upper gi endoscopy  12/17/2011    Procedure: UPPER GI ENDOSCOPY;  Surgeon: Valarie Merino, MD;  Location:  [facility] ORS;  Service: General;  Laterality: N/A;  . Cardiac catheterization N/A 10/25/2014    Procedure: Left Heart Cath and Coronary Angiography;  Surgeon: Kathleene Hazel, MD;  Location: [facility] INVASIVE CV LAB;  Service: Cardiovascular;  Laterality: N/A;  . Cardiac catheterization N/A 10/26/2014    Procedure: Coronary Stent Intervention;  Surgeon: Kathleene Hazel, MD;  Location: [facility] INVASIVE CV LAB;  Service: Cardiovascular;  Laterality: N/A;     Current Outpatient Prescriptions  Medication Sig Dispense Refill  . acetaminophen (TYLENOL) 325 MG tablet Take 2 tablets (650 mg total) by mouth every 4 (four) hours as needed for headache or mild pain.    Marland Kitchen amLODipine (NORVASC) 5 MG tablet Take 5 mg by mouth daily.    Marland Kitchen aspirin 81 MG tablet Take 81 mg by mouth daily.    Marland Kitchen atorvastatin (LIPITOR) 40 MG tablet Take 1 tablet (40 mg total) by mouth daily at 6 PM. 30 tablet 11  . clopidogrel (PLAVIX) 75 MG tablet Take 1 tablet (75 mg total) by mouth daily with breakfast. 30 tablet 11  . docusate sodium (COLACE) 100 MG capsule Take 100 mg by mouth daily.    . fish oil-omega-3 fatty acids 1000 MG capsule Take 1 g by mouth daily.     . metFORMIN (GLUCOPHAGE-XR) 500 MG 24 hr tablet Take 1 tablet (500 mg total) by mouth every morning.  5  . metoprolol tartrate (LOPRESSOR) 25 MG tablet Take 1 tablet (25 mg total) by mouth 2 (two) times daily. 60 tablet 11  . multivitamin-lutein (OCUVITE-LUTEIN) CAPS Take 1 capsule by mouth daily.    . nitroGLYCERIN (NITROSTAT) 0.4 MG SL tablet Place 1 tablet (0.4 mg total) under the tongue every 5 (five) minutes x 3 doses as needed for chest pain. 25 tablet 2   No current facility-administered medications for this visit.    Allergies:   Review of patient's allergies indicates no known allergies.    Social History:  The patient  reports that he has quit smoking. He does not have any smokeless tobacco history on file. He reports that he does not drink alcohol or use  illicit drugs.   Family History:  The patient's family history includes Heart disease in his father and mother.    ROS:   Please see the history of present illness.   Review of Systems  All other systems reviewed and are negative.     PHYSICAL EXAM: VS:  BP 122/58 mmHg  Pulse 66  Ht  (1.778 m)  Wt 177 lb 12.8 oz (80.65 kg)  BMI 25.51 kg/m2    Wt Readings from Last 3 Encounters:  11/01/14 177 lb 12.8 oz (80.65 kg)  10/27/14 180 lb 8.9 oz (81.9 kg)  05/07/12 197 lb (89.359 kg)     GEN: Well nourished, well developed, in no acute distress HEENT: normal Neck: no JVD,   no masses Cardiac:  Normal S1/S2, RRR; no murmur ,  no rubs or gallops, no edema; right wrist without hematoma or mass; R groin without hematoma or bruit    Respiratory:  clear to auscultation bilaterally, no wheezing, rhonchi or rales. GI: soft, nontender, nondistended, + BS MS: no deformity or atrophy Skin: warm and dry  Neuro:  CNs II-XII intact, Strength and sensation are intact Psych: Normal affect   EKG:  EKG is ordered today.  It demonstrateNSR, HR 66, normal axis, IVCD, nonspecific ST-T wave changes, no change from prior tracing, QTc 421 ms    Recent Labs: 10/27/2014: Hemoglobin 11.7*; Platelets 173 11/01/2014: BUN 37*; Creatinine, Ser 1.82*; Potassium 4.1; Sodium 139    Lipid Panel    Component Value Date/Time   CHOL 156 10/22/2014 0342   TRIG 126 10/22/2014 0342   HDL 41 10/22/2014 0342   CHOLHDL 3.8 10/22/2014 0342   VLDL 25 10/22/2014 0342   LDLCALC 90 10/22/2014 0342      ASSESSMENT AND PLAN:    CAD:   Patient is status post recent 2 vessel PCI with a synergy DES to be OM 2 and synergy DES 2 to the LAD.  He is doing well since DC from the hospital. He would like to do an exercise program at [facility] which he has done in the past. I have given him a letter to allow him to do this. He will continue on aspirin, Plavix, statin, beta blocker.   HTN:   Controlled. Continue  amlodipine, beta blocker.   Hyperlipidemia:   Continue statin. Obtain follow-up lipids and LFTs in 6 weeks.   CKD:   Obtain follow-up BMET today. Of note, ACE inhibitor was stopped in the hospital. We certainly can consider resuming this at some point in the future. However, given the patient's advanced age, I'm not sure what benefit an ACE inhibitor would afford him. I will hold off on resuming this medication at this time.   Diabetes Mellitus:  FU with PCP.  He remains on Metformin.     Medication Changes: Current medicines are reviewed at length with the patient today.  Concerns regarding medicines are as outlined above.  The following changes have been made:   Discontinued Medications   HYDROXYPROPYL METHYLCELLULOSE / HYPROMELLOSE (ISOPTO TEARS / GONIOVISC) 2.5 % OPHTHALMIC SOLUTION    Place 1 drop into both eyes daily as needed for dry eyes.   Modified Medications   No medications on file   New Prescriptions   No medications on file    Labs/ tests ordered today include:   Orders Placed This Encounter  Procedures  . Basic metabolic panel  . Lipid panel  . Hepatic function panel  . EKG 12-Lead      Disposition:    FU with Dr. Rachelle Hora Croitoru 2 mos.    Signed, Brynda Rim, MHS 11/01/2014 4:45 PM    [facility] [address] [place], [place], [place]  [address] Phone: [phone]; Fax: [phone]

## 2014-11-01 ENCOUNTER — Encounter: Payer: Self-pay | Admitting: Physician Assistant

## 2014-11-01 ENCOUNTER — Ambulatory Visit (INDEPENDENT_AMBULATORY_CARE_PROVIDER_SITE_OTHER): Payer: Medicare Other | Admitting: Physician Assistant

## 2014-11-01 VITALS — BP 122/58 | HR 66 | Ht 70.0 in | Wt 177.8 lb

## 2014-11-01 DIAGNOSIS — I2510 Atherosclerotic heart disease of native coronary artery without angina pectoris: Secondary | ICD-10-CM

## 2014-11-01 DIAGNOSIS — I10 Essential (primary) hypertension: Secondary | ICD-10-CM

## 2014-11-01 DIAGNOSIS — E785 Hyperlipidemia, unspecified: Secondary | ICD-10-CM

## 2014-11-01 DIAGNOSIS — N189 Chronic kidney disease, unspecified: Secondary | ICD-10-CM

## 2014-11-01 DIAGNOSIS — E119 Type 2 diabetes mellitus without complications: Secondary | ICD-10-CM

## 2014-11-01 LAB — BASIC METABOLIC PANEL
BUN: 37 mg/dL — AB (ref 6–23)
CALCIUM: 9.4 mg/dL (ref 8.4–10.5)
CO2: 26 mEq/L (ref 19–32)
CREATININE: 1.82 mg/dL — AB (ref 0.40–1.50)
Chloride: 107 mEq/L (ref 96–112)
GFR: 37.33 mL/min — ABNORMAL LOW (ref >60.00)
Glucose, Bld: 101 mg/dL — ABNORMAL HIGH (ref 70–99)
Potassium: 4.1 mEq/L (ref 3.5–5.1)
Sodium: 139 mEq/L (ref 135–145)

## 2014-11-01 NOTE — Patient Instructions (Signed)
Medication Instructions:  Your physician recommends that you continue on your current medications as directed. Please refer to the Current Medication list given to you today.   Labwork: Bmet today   Your physician recommends that you return for a FASTING lipid profile and lft in 6 weeks   Testing/Procedures: None ordered  Follow-Up: Your physician recommends that you schedule a follow-up appointment in: 2 months with Dr.Croitoru   Any Other Special Instructions Will Be Listed Below (If Applicable).

## 2014-11-03 ENCOUNTER — Telehealth: Payer: Self-pay | Admitting: *Deleted

## 2014-11-03 NOTE — Telephone Encounter (Signed)
Ptcb and has been notified of lab results by phone with verbal understanding. I verified appt 9/19 with Boyce Medici, PA @ 3:30 pt verified yes.

## 2014-11-07 ENCOUNTER — Ambulatory Visit (INDEPENDENT_AMBULATORY_CARE_PROVIDER_SITE_OTHER): Payer: Medicare Other | Admitting: Cardiology

## 2014-11-07 ENCOUNTER — Encounter: Payer: Self-pay | Admitting: Cardiology

## 2014-11-07 VITALS — BP 132/70 | HR 65 | Ht 70.0 in | Wt 183.0 lb

## 2014-11-07 DIAGNOSIS — I2510 Atherosclerotic heart disease of native coronary artery without angina pectoris: Secondary | ICD-10-CM | POA: Diagnosis not present

## 2014-11-07 NOTE — Progress Notes (Signed)
Encounter created in error. Patient was seen by Mike Newcomer, PA-C, on 11/01/14 for post hospital f/u and was instructed to f/u with Dr. Royann Shivers in 2 months. There is no indication for today's appointment. This appointment was scheduled in error. The patient has no new issues/complaints and VSS. No new issues to address today. No charge visit.   SIMMONS, BRITTAINY

## 2014-12-13 ENCOUNTER — Other Ambulatory Visit (INDEPENDENT_AMBULATORY_CARE_PROVIDER_SITE_OTHER): Payer: Medicare Other

## 2014-12-13 DIAGNOSIS — I209 Angina pectoris, unspecified: Secondary | ICD-10-CM | POA: Diagnosis not present

## 2014-12-14 ENCOUNTER — Other Ambulatory Visit (INDEPENDENT_AMBULATORY_CARE_PROVIDER_SITE_OTHER): Payer: Medicare Other | Admitting: *Deleted

## 2014-12-14 DIAGNOSIS — I209 Angina pectoris, unspecified: Secondary | ICD-10-CM | POA: Diagnosis not present

## 2014-12-14 DIAGNOSIS — I10 Essential (primary) hypertension: Secondary | ICD-10-CM

## 2014-12-14 LAB — HEPATIC FUNCTION PANEL
ALBUMIN: 3.8 g/dL (ref 3.6–5.1)
ALT: 17 U/L (ref 9–46)
AST: 17 U/L (ref 10–35)
Alkaline Phosphatase: 62 U/L (ref 40–115)
BILIRUBIN TOTAL: 1.1 mg/dL (ref 0.2–1.2)
Bilirubin, Direct: 0.2 mg/dL (ref <=0.2)
Indirect Bilirubin: 0.9 mg/dL (ref 0.2–1.2)
Total Protein: 6.5 g/dL (ref 6.1–8.1)

## 2014-12-14 LAB — LIPID PANEL
CHOL/HDL RATIO: 2.3 ratio (ref <=5.0)
CHOLESTEROL: 109 mg/dL — AB (ref 125–200)
HDL: 48 mg/dL (ref >=40)
LDL Cholesterol: 35 mg/dL (ref <130)
Triglycerides: 128 mg/dL (ref <150)
VLDL: 26 mg/dL (ref <30)

## 2014-12-20 ENCOUNTER — Telehealth: Payer: Self-pay | Admitting: *Deleted

## 2014-12-20 NOTE — Telephone Encounter (Signed)
Called and made pt aware that his Cholesterol was excellent and that his liver was normal and to continue his LIpitor.  Pt was reminded of his f/u appt 03-05-14 @ 10:00 with Dr Royann Shiversroitoru @ [facility] office.  Pt verbalized understanding.

## 2014-12-20 NOTE — Telephone Encounter (Signed)
-----   Message from Allayne ButcherBrittainy M Simmons, [place] JerseyPA-C sent at 12/16/2014  5:10 PM EDT ----- Cholesterol is excellent. Liver function is normal. Continue lipitor.

## 2015-01-04 ENCOUNTER — Encounter: Payer: Self-pay | Admitting: Cardiovascular Disease

## 2015-01-04 ENCOUNTER — Ambulatory Visit (INDEPENDENT_AMBULATORY_CARE_PROVIDER_SITE_OTHER): Payer: Medicare Other | Admitting: Cardiovascular Disease

## 2015-01-04 VITALS — BP 164/76 | HR 76 | Ht 69.5 in | Wt 180.6 lb

## 2015-01-04 DIAGNOSIS — Z9861 Coronary angioplasty status: Secondary | ICD-10-CM | POA: Diagnosis not present

## 2015-01-04 DIAGNOSIS — I2510 Atherosclerotic heart disease of native coronary artery without angina pectoris: Secondary | ICD-10-CM | POA: Diagnosis not present

## 2015-01-04 DIAGNOSIS — I10 Essential (primary) hypertension: Secondary | ICD-10-CM | POA: Diagnosis not present

## 2015-01-04 NOTE — Progress Notes (Signed)
Patient ID: [name], male   DOB: November 22, 1922, 81 y.o.   MRN: [id number]     Cardiology Office Note   Date:  01/04/2015   ID:  [name], DOB 17-Feb-1923, MRN [id number]  PCP:  Mike Otto, MD  Cardiologist:   Thurmon Fair, MD   Chief Complaint  Patient presents with  . Follow-up    no chest pain,no shortness of breath, edema-right leg and ankle, pain in legs, no cramping in legs, occassional lightheadedness, occassional dizziness      History of Present Illness: [name] is a 81 y.o. male who presents for CAD follow up, 2 months after  Presentation with unstable angina without evidence of infarction , followed by cardiac catheterization and placement of drug-eluting stents to the OM 2 and mid LAD x 2.  He has normal left ventricular systolic function.   he has an excellent lipid profile with a very low LDL cholesterol. His ACE inhibitor was held after catheterization due to elevated creatinine. He has not had any bleeding complications on dual antiplatelet therapy.   he walks 3 times a day. His morning walk is about the length of the football field , the other 2 walks a little shorter. He denies any angina or dyspnea. He has a variety of joint related aches and pains but remains quite active for his age. He will be 89 in January. He denies any bleeding problem but does bear a diagnosis of iron deficiency anemia. He tells me he is taking iron supplements 3 days a week. His most recent hemoglobin was around 11. He has not had overt bleeding and a stool Hemoccult test was reportedly negative.  Past Medical History  Diagnosis Date  . Diabetes mellitus without complication (HCC)     DIET AND WEIGHT CONTROL  . GERD (gastroesophageal reflux disease)   . Essential hypertension 10/22/2014    Past Surgical History  Procedure Laterality Date  . Joint replacement  2007    rt hip   . Hernia repair  1952  . Heller myotomy  12/17/2011    Procedure: LAPAROSCOPIC HELLER  MYOTOMY;  Surgeon: Valarie Merino, MD;  Location: [facility];  Service: General;  Laterality: N/A;  . Upper gi endoscopy  12/17/2011    Procedure: UPPER GI ENDOSCOPY;  Surgeon: Valarie Merino, MD;  Location: [facility];  Service: General;  Laterality: N/A;  . Cardiac catheterization N/A 10/25/2014    Procedure: Left Heart Cath and Coronary Angiography;  Surgeon: Kathleene Hazel, MD;  Location: [facility] INVASIVE CV LAB;  Service: Cardiovascular;  Laterality: N/A;  . Cardiac catheterization N/A 10/26/2014    Procedure: Coronary Stent Intervention;  Surgeon: Kathleene Hazel, MD;  Location: [facility] INVASIVE CV LAB;  Service: Cardiovascular;  Laterality: N/A;     Current Outpatient Prescriptions  Medication Sig Dispense Refill  . acetaminophen (TYLENOL) 325 MG tablet Take 2 tablets (650 mg total) by mouth every 4 (four) hours as needed for headache or mild pain.    Marland Kitchen amLODipine (NORVASC) 5 MG tablet Take 5 mg by mouth daily.    Marland Kitchen aspirin 81 MG tablet Take 81 mg by mouth daily.    Marland Kitchen atorvastatin (LIPITOR) 40 MG tablet Take 1 tablet (40 mg total) by mouth daily at 6 PM. 30 tablet 11  . clopidogrel (PLAVIX) 75 MG tablet Take 1 tablet (75 mg total) by mouth daily with breakfast. 30 tablet 11  . docusate sodium (COLACE) 100 MG capsule Take 100 mg by mouth daily.    Marland Kitchen  fish oil-omega-3 fatty acids 1000 MG capsule Take 1 g by mouth daily.     . metFORMIN (GLUCOPHAGE-XR) 500 MG 24 hr tablet Take 1 tablet (500 mg total) by mouth every morning.  5  . metoprolol tartrate (LOPRESSOR) 25 MG tablet Take 1 tablet (25 mg total) by mouth 2 (two) times daily. 60 tablet 11  . multivitamin-lutein (OCUVITE-LUTEIN) CAPS Take 1 capsule by mouth daily.    . nitroGLYCERIN (NITROSTAT) 0.4 MG SL tablet Place 1 tablet (0.4 mg total) under the tongue every 5 (five) minutes x 3 doses as needed for chest pain. 25 tablet 2   No current facility-administered medications for this visit.    Allergies:   Review of patient's allergies  indicates no known allergies.    Social History:  The patient  reports that he has quit smoking. He does not have any smokeless tobacco history on file. He reports that he does not drink alcohol or use illicit drugs.   Family History:  The patient's family history includes Heart disease in his father and mother.    ROS:  Please see the history of present illness.    Otherwise, review of systems positive for none.   All other systems are reviewed and negative.    PHYSICAL EXAM: VS:  BP 164/76 mmHg  Pulse 76  Ht 5' 9.5" (1.765 m)  Wt 180 lb 9 oz (81.903 kg)  BMI 26.29 kg/m2 , BMI Body mass index is 26.29 kg/(m^2).  BP recheck 149/72 mm Hg General: Alert, oriented x3, no distress Head: no evidence of trauma, PERRL, EOMI, no exophtalmos or lid lag, no myxedema, no xanthelasma; normal ears, nose and oropharynx Neck: normal jugular venous pulsations and no hepatojugular reflux; brisk carotid pulses without delay and no carotid bruits Chest: clear to auscultation, no signs of consolidation by percussion or palpation, normal fremitus, symmetrical and full respiratory excursions Cardiovascular: normal position and quality of the apical impulse, regular rhythm, normal first and second heart sounds, no murmurs, rubs or gallops Abdomen: no tenderness or distention, no masses by palpation, no abnormal pulsatility or arterial bruits, normal bowel sounds, no hepatosplenomegaly Extremities: no clubbing, cyanosis or edema; 2+ radial, ulnar and brachial pulses bilaterally; 2+ right femoral, posterior tibial and dorsalis pedis pulses; 2+ left femoral, posterior tibial and dorsalis pedis pulses; no subclavian or femoral bruits Neurological: grossly nonfocal Psych: euthymic mood, full affect   EKG:  EKG is not ordered today.   Recent Labs: 10/27/2014: Hemoglobin 11.7*; Platelets 173 11/01/2014: BUN 37*; Creatinine, Ser 1.82*; Potassium 4.1; Sodium 139 12/14/2014: ALT 17    Lipid Panel    Component  Value Date/Time   CHOL 109* 12/14/2014 0946   TRIG 128 12/14/2014 0946   HDL 48 12/14/2014 0946   CHOLHDL 2.3 12/14/2014 0946   VLDL 26 12/14/2014 0946   LDLCALC 35 12/14/2014 0946      Wt Readings from Last 3 Encounters:  01/04/15 180 lb 9 oz (81.903 kg)  11/07/14 183 lb (83.008 kg)  11/01/14 177 lb 12.8 oz (80.65 kg)    .   ASSESSMENT AND PLAN:  1.  CAD s/p DES x 3 in Sept 2016.  He needs to continue dual antiplatelet therapy for a minimum of 6 months, preferably for a whole year. May revisit this earlier if his anemia worsens, but at this point there is no evidence for GI bleeding. Asked him to call me should he develop any recurrent exertional angina.  2.  Dyslipidemia with extremely low LDL cholesterol  on current statin regimen. I would continue the current high dose of atorvastatin for the first 12 months after his acute coronary syndrome, but will probably reduce the dose at his yearly appointment, trying to keep the LDL less than 70. He has no symptoms of statin myopathy, but rather just arthralgia  3.  Hypertension with good control. His blood pressure was a little elevated when he initially presented, but at home is consistent 130-140/60-70 mmHg. No change to his medications. Will need his renal function periodically reevaluated.  Current medicines are reviewed at length with the patient today.  The patient does not have concerns regarding medicines.  The following changes have been made:  no change  Labs/ tests ordered today include:  No orders of the defined types were placed in this encounter.     Patient Instructions  Dr. Royann Shivers recommends that you schedule a follow-up appointment in: September 2017        Joie Bimler, MD  01/04/2015 10:43 AM    Thurmon Fair, MD, [facility] [phone] office [phone] pager

## 2015-01-04 NOTE — Patient Instructions (Signed)
Dr. Royann Shiversroitoru recommends that you schedule a follow-up appointment in: September 2017

## 2015-05-16 ENCOUNTER — Ambulatory Visit (INDEPENDENT_AMBULATORY_CARE_PROVIDER_SITE_OTHER): Payer: Medicare Other

## 2015-05-16 ENCOUNTER — Ambulatory Visit
Admission: RE | Admit: 2015-05-16 | Discharge: 2015-05-16 | Disposition: A | Discharge status: Home / Self Care | Payer: Medicare Other | Source: Ambulatory Visit | Attending: Emergency Medicine | Admitting: Emergency Medicine

## 2015-05-16 ENCOUNTER — Ambulatory Visit (INDEPENDENT_AMBULATORY_CARE_PROVIDER_SITE_OTHER): Payer: Medicare Other | Admitting: Emergency Medicine

## 2015-05-16 VITALS — BP 124/68 | HR 61 | Temp 98.6°F | Resp 20 | Ht 69.0 in | Wt 182.0 lb

## 2015-05-16 DIAGNOSIS — S8001XA Contusion of right knee, initial encounter: Secondary | ICD-10-CM | POA: Diagnosis not present

## 2015-05-16 DIAGNOSIS — M25551 Pain in right hip: Secondary | ICD-10-CM

## 2015-05-16 DIAGNOSIS — S0990XA Unspecified injury of head, initial encounter: Secondary | ICD-10-CM

## 2015-05-16 NOTE — Progress Notes (Addendum)
Patient ID: [name], male   DOB: [birth year], 82 y.o.   MRN: [id number]    By signing my name below, I, Essence Howell, attest that this documentation has been prepared under the direction and in the presence of Collene GobbleSteven A Voncille Simm, MD Electronically Signed: Charline BillsEssence Howell, ED Scribe 05/16/2015 at 1:09 PM.  Chief Complaint:  Chief Complaint  Patient presents with  . Headache    fall last night  . Hip Pain    R. side   . Joint Swelling    Right ankle   HPI: [name] is a 82 y.o. male [facility] complaining of a fall that occurred around 3 AM this morning. Pt states that he got out of bed to go to the restroom when he lost his balance and fell into the wall in the bathtub. No LOC. He reports striking his posterior head, right hip and right ankle upon falling. Pt reports associated joint swelling to the right ankle and mild HA that has resolved. He further reports increased right hip pain with bearing weight and palpation. Pt currently ambulates with a cane for assistance. He reports h/o right hip replacement 10 years ago. He is taking Plavix. He denies chest pain, abdominal pain and any other symptoms at this time.   Past Medical History  Diagnosis Date  . Diabetes mellitus without complication (HCC)     DIET AND WEIGHT CONTROL  . GERD (gastroesophageal reflux disease)   . Essential hypertension 10/22/2014  . Anemia    Past Surgical History  Procedure Laterality Date  . Joint replacement  2007    rt hip   . Hernia repair  1952  . Heller myotomy  12/17/2011    Procedure: LAPAROSCOPIC HELLER MYOTOMY;  Surgeon: Valarie MerinoMatthew B Martin, MD;  Location: [facility];  Service: General;  Laterality: N/A;  . Upper gi endoscopy  12/17/2011    Procedure: UPPER GI ENDOSCOPY;  Surgeon: Valarie MerinoMatthew B Martin, MD;  Location: [facility];  Service: General;  Laterality: N/A;  . Cardiac catheterization N/A 10/25/2014    Procedure: Left Heart Cath and Coronary Angiography;  Surgeon: Kathleene Hazelhristopher D McAlhany,  MD;  [facility];  Service: Cardiovascular;  Laterality: N/A;  . Cardiac catheterization N/A 10/26/2014    Procedure: Coronary Stent Intervention;  Surgeon: Kathleene Hazelhristopher D McAlhany, MD;  [facility];  Service: Cardiovascular;  Laterality: N/A;   Social History   Social History  . Marital Status: Married    Spouse Name: N/A  . Number of Children: N/A  . Years of Education: N/A   Social History Main Topics  . Smoking status: Former Games developermoker  . Smokeless tobacco: None     Comment: stopped 7624yrs ago  . Alcohol Use: No  . Drug Use: No  . Sexual Activity: Not Asked   Other Topics Concern  . None   Social History Narrative   Family History  Problem Relation Age of Onset  . Heart disease Mother   . Heart disease Father    No Known Allergies Prior to Admission medications   Medication Sig Start Date End Date Taking? Authorizing Provider  acetaminophen (TYLENOL) 325 MG tablet Take 2 tablets (650 mg total) by mouth every 4 (four) hours as needed for headache or mild pain. 10/27/14  Yes Luke K Kilroy, PA-C  amLODipine (NORVASC) 5 MG tablet Take 5 mg by mouth daily.   Yes Historical Provider, MD  aspirin 81 MG tablet Take 81 mg by  mouth daily.   Yes Historical Provider, MD  atorvastatin (LIPITOR) 40 MG tablet Take 1 tablet (40 mg total) by mouth daily at 6 PM. 10/27/14  Yes Abelino Derrick, PA-C  clopidogrel (PLAVIX) 75 MG tablet Take 1 tablet (75 mg total) by mouth daily with breakfast. 10/27/14  Yes Abelino Derrick, PA-C  docusate sodium (COLACE) 100 MG capsule Take 100 mg by mouth daily.   Yes Historical Provider, MD  fish oil-omega-3 fatty acids 1000 MG capsule Take 1 g by mouth daily.    Yes Historical Provider, MD  metFORMIN (GLUCOPHAGE-XR) 500 MG 24 hr tablet Take 1 tablet (500 mg total) by mouth every morning. 10/29/14  Yes Luke K Kilroy, PA-C  metoprolol tartrate (LOPRESSOR) 25 MG tablet Take 1 tablet (25 mg total) by mouth 2 (two) times daily. 10/27/14  Yes Luke  K Kilroy, PA-C  multivitamin-lutein (OCUVITE-LUTEIN) CAPS Take 1 capsule by mouth daily.   Yes Historical Provider, MD  nitroGLYCERIN (NITROSTAT) 0.4 MG SL tablet Place 1 tablet (0.4 mg total) under the tongue every 5 (five) minutes x 3 doses as needed for chest pain. 10/27/14  Yes Luke K Kilroy, PA-C   ROS: The patient denies fevers, chills, night sweats, unintentional weight loss, -chest pain, palpitations, wheezing, dyspnea on exertion, nausea, vomiting, -abdominal pain, dysuria, hematuria, melena, numbness, weakness, or tingling. +arthralgias, +joint swelling, +HA (resolved)   All other systems have been reviewed and were otherwise negative with the exception of those mentioned in the HPI and as above.    PHYSICAL EXAM: Filed Vitals:   05/16/15 1250  BP: 124/68  Pulse: 61  Temp: 98.6 F (37 C)  Resp: 20   Body mass index is 26.86 kg/(m^2).  General: Alert, no acute distress HEENT:  Normocephalic, atraumatic, oropharynx patent. Tiny abrasion over the back of his head.  Neck: Neck is supple. Eye: Nonie Hoyer [facility] Cardiovascular:  Regular rate and rhythm, no rubs murmurs or gallops.  No Carotid bruits, radial pulse intact. No pedal edema.  Respiratory: Clear to auscultation bilaterally.  No wheezes, rales, or rhonchi.  No cyanosis, no use of accessory musculature Abdominal: No organomegaly, abdomen is soft and non-tender. Musculoskeletal: Chest wall is non-tender. Tenderness over the lateral R hip.  Skin: No rashes. Neurologic: Facial musculature symmetric. Psychiatric: Patient acts appropriately throughout our interaction. Lymphatic: No cervical or submandibular lymphadenopathy  LABS:  EKG/XRAY:   Primary read interpreted by Dr. Cleta Alberts at [facility].  Dg Knee 1-2 Views Right  05/16/2015  ADDENDUM REPORT: 05/16/2015 14:51 ADDENDUM: Right hip: The total right hip arthroplasty is well seated. No complicating features. No periprosthetic fracture. The bone cement interface is normal. Areas of  heterotopic ossification are noted. The visualized right hemipelvis is intact. IMPRESSION: No acute bony findings. Electronically Signed   By: Rudie Meyer M.D.   On: 05/16/2015 14:51  05/16/2015  CLINICAL DATA:  Larey Seat in the shower today and injured right knee. EXAM: DG HIP (WITH OR WITHOUT PELVIS) 2-3V RIGHT; RIGHT KNEE - 1-2 VIEW COMPARISON:  None. FINDINGS: There are moderate tricompartmental degenerative changes with joint space narrowing and mild osteophytic spurring. No acute fracture or osteochondral abnormality. Chondrocalcinosis is noted. No definite joint effusion. IMPRESSION: Moderate degenerative changes and chondrocalcinosis but no acute fracture or joint effusion. Electronically Signed: By: Rudie Meyer M.D. On: 05/16/2015 13:53   Dg Hip Unilat W Or W/o Pelvis 2-3 Views Right  05/16/2015  ADDENDUM REPORT: 05/16/2015 14:51 ADDENDUM: Right hip: The total right hip arthroplasty is well seated. No complicating  features. No periprosthetic fracture. The bone cement interface is normal. Areas of heterotopic ossification are noted. The visualized right hemipelvis is intact. IMPRESSION: No acute bony findings. Electronically Signed   By: Rudie Meyer M.D.   On: 05/16/2015 14:51  05/16/2015  CLINICAL DATA:  Larey Seat in the shower today and injured right knee. EXAM: DG HIP (WITH OR WITHOUT PELVIS) 2-3V RIGHT; RIGHT KNEE - 1-2 VIEW COMPARISON:  None. FINDINGS: There are moderate tricompartmental degenerative changes with joint space narrowing and mild osteophytic spurring. No acute fracture or osteochondral abnormality. Chondrocalcinosis is noted. No definite joint effusion. IMPRESSION: Moderate degenerative changes and chondrocalcinosis but no acute fracture or joint effusion. Electronically Signed: By: Rudie Meyer M.D. On: 05/16/2015 13:53    ASSESSMENT/PLAN: We'll proceed with x-rays of the right knee and right hip.These are neg. For fracture.Will proceed with CT of right hip. No evidence on exam of  serious head injury. I personally performed the services described in this documentation, which was scribed in my presence. The recorded information has been reviewed and is accurate.    Gross sideeffects, risk and benefits, and alternatives of medications d/w patient. Patient is aware that all medications have potential sideeffects and we are unable to predict every sideeffect or drug-drug interaction that may occur.  Lesle Chris MD 05/16/2015 12:57 PM

## 2015-05-16 NOTE — Patient Instructions (Signed)
     IF you received an x-ray today, you will receive an invoice from [facility]. Please contact [facility] at [phone] with questions or concerns regarding your invoice.   IF you received labwork today, you will receive an invoice from [facility]. Please contact [facility] at [phone] with questions or concerns regarding your invoice.   Our billing staff will not be able to assist you with questions regarding bills from these companies.  You will be contacted with the lab results as soon as they are available. The fastest way to get your results is to activate your My Chart account. Instructions are located on the last page of this paperwork. If you have not heard from us regarding the results in 2 weeks, please contact this office.      

## 2015-09-28 ENCOUNTER — Other Ambulatory Visit: Payer: Self-pay | Admitting: Cardiology

## 2015-10-03 ENCOUNTER — Ambulatory Visit
Admission: RE | Admit: 2015-10-03 | Discharge: 2015-10-03 | Disposition: A | Discharge status: Home / Self Care | Payer: Medicare Other | Source: Ambulatory Visit | Attending: Geriatric Medicine | Admitting: Geriatric Medicine

## 2015-10-03 ENCOUNTER — Other Ambulatory Visit: Payer: Self-pay | Admitting: Geriatric Medicine

## 2015-10-03 DIAGNOSIS — M25552 Pain in left hip: Secondary | ICD-10-CM

## 2015-10-03 DIAGNOSIS — R0789 Other chest pain: Secondary | ICD-10-CM

## 2015-10-05 ENCOUNTER — Ambulatory Visit (INDEPENDENT_AMBULATORY_CARE_PROVIDER_SITE_OTHER): Payer: Medicare Other | Admitting: Cardiovascular Disease

## 2015-10-05 ENCOUNTER — Encounter: Payer: Self-pay | Admitting: Cardiovascular Disease

## 2015-10-05 ENCOUNTER — Encounter (INDEPENDENT_AMBULATORY_CARE_PROVIDER_SITE_OTHER): Payer: Self-pay

## 2015-10-05 VITALS — BP 118/72 | HR 67 | Ht 71.0 in | Wt 179.0 lb

## 2015-10-05 DIAGNOSIS — Z79899 Other long term (current) drug therapy: Secondary | ICD-10-CM

## 2015-10-05 DIAGNOSIS — Z9861 Coronary angioplasty status: Secondary | ICD-10-CM

## 2015-10-05 DIAGNOSIS — I2510 Atherosclerotic heart disease of native coronary artery without angina pectoris: Secondary | ICD-10-CM | POA: Diagnosis not present

## 2015-10-05 DIAGNOSIS — E1122 Type 2 diabetes mellitus with diabetic chronic kidney disease: Secondary | ICD-10-CM

## 2015-10-05 DIAGNOSIS — I10 Essential (primary) hypertension: Secondary | ICD-10-CM | POA: Diagnosis not present

## 2015-10-05 DIAGNOSIS — N183 Chronic kidney disease, stage 3 unspecified: Secondary | ICD-10-CM

## 2015-10-05 DIAGNOSIS — E785 Hyperlipidemia, unspecified: Secondary | ICD-10-CM
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
## 2015-10-05 NOTE — Patient Instructions (Signed)
Medication Instructions: Dr Royann Shiversroitoru has recommended making the following medication changes: 1. STOP Clopidogrel  Labwork: Your physician recommends that you return for lab work at your convenience - FASTING.  Testing/Procedures: NONE ORDERED  Follow-up: Dr Royann Shiversroitoru recommends that you schedule a follow-up appointment in 12 months. You will receive a reminder letter in the mail two months in advance. If you don't receive a letter, please call our office to schedule the follow-up appointment.  If you need a refill on your cardiac medications before your next appointment, please call your pharmacy.

## 2015-11-13 ENCOUNTER — Other Ambulatory Visit: Payer: Self-pay | Admitting: Cardiology

## 2016-05-14 ENCOUNTER — Other Ambulatory Visit: Payer: Self-pay | Admitting: Gastroenterology

## 2016-05-14 DIAGNOSIS — R1319 Other dysphagia: Secondary | ICD-10-CM

## 2016-05-14 DIAGNOSIS — R1310 Dysphagia, unspecified: Secondary | ICD-10-CM

## 2016-05-22 ENCOUNTER — Ambulatory Visit
Admission: RE | Admit: 2016-05-22 | Discharge: 2016-05-22 | Disposition: A | Discharge status: Home / Self Care | Payer: Medicare Other | Source: Ambulatory Visit | Attending: Gastroenterology | Admitting: Gastroenterology

## 2016-05-22 DIAGNOSIS — R1319 Other dysphagia: Secondary | ICD-10-CM

## 2016-05-22 DIAGNOSIS — R1310 Dysphagia, unspecified: Secondary | ICD-10-CM

## 2016-09-04 ENCOUNTER — Other Ambulatory Visit: Payer: Self-pay | Admitting: Gastroenterology

## 2016-09-04 DIAGNOSIS — R1030 Lower abdominal pain, unspecified: Secondary | ICD-10-CM

## 2016-09-06 ENCOUNTER — Ambulatory Visit
Admission: RE | Admit: 2016-09-06 | Discharge: 2016-09-06 | Disposition: A | Discharge status: Home / Self Care | Payer: Medicare Other | Source: Ambulatory Visit | Attending: Gastroenterology | Admitting: Gastroenterology

## 2016-09-06 DIAGNOSIS — R1030 Lower abdominal pain, unspecified: Secondary | ICD-10-CM

## 2016-10-01 ENCOUNTER — Other Ambulatory Visit: Payer: Self-pay | Admitting: Cardiovascular Disease

## 2016-10-02 ENCOUNTER — Other Ambulatory Visit: Payer: Self-pay | Admitting: Cardiovascular Disease

## 2016-11-14 ENCOUNTER — Other Ambulatory Visit: Payer: Self-pay | Admitting: Gastroenterology

## 2016-11-20 ENCOUNTER — Encounter (HOSPITAL_COMMUNITY): Payer: Self-pay | Admitting: *Deleted

## 2016-11-21 NOTE — Progress Notes (Signed)
Spoke with [employer] and they verified via phone prescription medications patient was taking.

## 2016-11-25 ENCOUNTER — Ambulatory Visit (HOSPITAL_COMMUNITY)
Admission: RE | Admit: 2016-11-25 | Discharge: 2016-11-25 | Disposition: A | Discharge status: Home / Self Care | Payer: Medicare Other | Source: Ambulatory Visit | Attending: Gastroenterology | Admitting: Gastroenterology

## 2016-11-25 ENCOUNTER — Encounter (HOSPITAL_COMMUNITY)
Admission: RE | Disposition: A | Discharge status: Home / Self Care | Payer: Self-pay | Source: Ambulatory Visit | Attending: Gastroenterology

## 2016-11-25 ENCOUNTER — Ambulatory Visit (HOSPITAL_COMMUNITY): Payer: Medicare Other

## 2016-11-25 ENCOUNTER — Ambulatory Visit (HOSPITAL_COMMUNITY): Payer: Medicare Other | Admitting: Certified Registered Nurse Anesthetist

## 2016-11-25 ENCOUNTER — Encounter (HOSPITAL_COMMUNITY): Payer: Self-pay

## 2016-11-25 DIAGNOSIS — Z87891 Personal history of nicotine dependence: Secondary | ICD-10-CM | POA: Insufficient documentation

## 2016-11-25 DIAGNOSIS — N183 Chronic kidney disease, stage 3 (moderate): Secondary | ICD-10-CM | POA: Diagnosis not present

## 2016-11-25 DIAGNOSIS — I129 Hypertensive chronic kidney disease with stage 1 through stage 4 chronic kidney disease, or unspecified chronic kidney disease: Secondary | ICD-10-CM | POA: Diagnosis not present

## 2016-11-25 DIAGNOSIS — Z96641 Presence of right artificial hip joint: Secondary | ICD-10-CM | POA: Diagnosis not present

## 2016-11-25 DIAGNOSIS — Z7984 Long term (current) use of oral hypoglycemic drugs: Secondary | ICD-10-CM | POA: Diagnosis not present

## 2016-11-25 DIAGNOSIS — Z955 Presence of coronary angioplasty implant and graft: Secondary | ICD-10-CM | POA: Diagnosis not present

## 2016-11-25 DIAGNOSIS — Z79899 Other long term (current) drug therapy: Secondary | ICD-10-CM | POA: Diagnosis not present

## 2016-11-25 DIAGNOSIS — I2510 Atherosclerotic heart disease of native coronary artery without angina pectoris: Secondary | ICD-10-CM | POA: Insufficient documentation

## 2016-11-25 DIAGNOSIS — E1122 Type 2 diabetes mellitus with diabetic chronic kidney disease: Secondary | ICD-10-CM | POA: Diagnosis not present

## 2016-11-25 DIAGNOSIS — K219 Gastro-esophageal reflux disease without esophagitis: Secondary | ICD-10-CM | POA: Diagnosis not present

## 2016-11-25 DIAGNOSIS — Z7982 Long term (current) use of aspirin: Secondary | ICD-10-CM | POA: Insufficient documentation

## 2016-11-25 DIAGNOSIS — D649 Anemia, unspecified: Secondary | ICD-10-CM | POA: Insufficient documentation

## 2016-11-25 DIAGNOSIS — K222 Esophageal obstruction: Secondary | ICD-10-CM | POA: Diagnosis not present

## 2016-11-25 DIAGNOSIS — K449 Diaphragmatic hernia without obstruction or gangrene: Secondary | ICD-10-CM | POA: Diagnosis not present

## 2016-11-25 DIAGNOSIS — R1310 Dysphagia, unspecified: Secondary | ICD-10-CM | POA: Diagnosis present

## 2016-11-25 DIAGNOSIS — E785 Hyperlipidemia, unspecified: Secondary | ICD-10-CM | POA: Diagnosis not present

## 2016-11-25 HISTORY — DX: Unspecified multiple injuries, initial encounter: T07.XXXA

## 2016-11-25 HISTORY — DX: Disease of blood and blood-forming organs, unspecified: D75.9

## 2016-11-25 HISTORY — PX: SAVORY DILATION: SHX5439

## 2016-11-25 HISTORY — DX: Atherosclerotic heart disease of native coronary artery without angina pectoris: I25.10

## 2016-11-25 HISTORY — PX: ESOPHAGOGASTRODUODENOSCOPY (EGD) WITH PROPOFOL: SHX5813

## 2016-11-25 SURGERY — ESOPHAGOGASTRODUODENOSCOPY (EGD) WITH PROPOFOL
Anesthesia: Monitor Anesthesia Care

## 2016-11-25 MED ORDER — SODIUM CHLORIDE 0.9 % IV SOLN: INTRAVENOUS | Status: DC

## 2016-11-25 MED ORDER — PROPOFOL 10 MG/ML IV BOLUS
INTRAVENOUS | Status: DC | PRN
  Administered 2016-11-25: 10 mg via INTRAVENOUS

## 2016-11-25 MED ORDER — ONDANSETRON HCL 4 MG/2ML IJ SOLN
INTRAMUSCULAR | Status: DC | PRN
  Administered 2016-11-25: 4 mg via INTRAVENOUS

## 2016-11-25 MED ORDER — LIDOCAINE 2% (20 MG/ML) 5 ML SYRINGE
INTRAMUSCULAR | Status: DC | PRN
  Administered 2016-11-25: 100 mg via INTRAVENOUS

## 2016-11-25 MED ORDER — PROPOFOL 500 MG/50ML IV EMUL
INTRAVENOUS | Status: DC | PRN
  Administered 2016-11-25: 150 ug/kg/min via INTRAVENOUS

## 2016-11-25 MED ORDER — LIDOCAINE 2% (20 MG/ML) 5 ML SYRINGE
INTRAMUSCULAR | Status: AC
  Filled 2016-11-25: qty 5

## 2016-11-25 MED ORDER — LACTATED RINGERS IV SOLN
INTRAVENOUS | Status: DC
  Administered 2016-11-25: 09:00:00 via INTRAVENOUS

## 2016-11-25 MED ORDER — ONDANSETRON HCL 4 MG/2ML IJ SOLN
INTRAMUSCULAR | Status: AC
  Filled 2016-11-25: qty 2

## 2016-11-25 MED ORDER — PROPOFOL 10 MG/ML IV BOLUS
INTRAVENOUS | Status: AC
  Filled 2016-11-25: qty 40

## 2016-11-25 SURGICAL SUPPLY — 15 items

## 2016-11-25 NOTE — Transfer of Care (Signed)
Immediate Anesthesia Transfer of Care Note  Patient: [name]  Procedure(s) Performed: Procedure(s): ESOPHAGOGASTRODUODENOSCOPY (EGD) WITH PROPOFOL (N/A) SAVORY DILATION (N/A)  Patient Location: PACU  Anesthesia Type:MAC  Level of Consciousness:  sedated, patient cooperative and responds to stimulation  Airway & Oxygen Therapy:Patient Spontanous Breathing and Patient connected to face mask oxgen  Post-op Assessment:  Report given to PACU RN and Post -op Vital signs reviewed and stable  Post vital signs:  Reviewed and stable  Last Vitals:  Vitals:   11/25/16 0817  BP: (!) 151/63  Pulse: 71  Resp: 18  Temp: 36.5 C  SpO2: 67%    Complications: No apparent anesthesia complications

## 2016-11-25 NOTE — Op Note (Signed)
[facility] Patient Name: [name] Procedure Date: 11/25/2016 MRN: [id number] Attending MD: Nancy Fetter Dr., MD Date of Birth: [birth year] CSN: [id number] Age: 83 Admit Type: Outpatient Procedure:                Upper GI endoscopy with Savary dilatation Indications:              Dysphagia, he has a history of achalasia in a                            number of years ago underwent laparoscopic Heller                            myotomy with toupee rap. He has had some                            progressive dysphagia and has begun to have some                            weight loss. Barium swallow shows a hiatal hernia                            and a narrowing right at the GE junction consistent                            with a mild stricture with hangup of a barium                            tablet. Providers:                Joyice Faster. Malissa Slay Dr., MD, Angus Seller, Alan Mulder, Technician Referring MD:              Medicines:                Monitored Anesthesia Care Complications:            No immediate complications. Estimated Blood Loss:     Estimated blood loss: none. Procedure:                Pre-Anesthesia Assessment:                           - Prior to the procedure, a History and Physical                            was performed, and patient medications and                            allergies were reviewed. The patient's tolerance of                            previous anesthesia was also reviewed. The risks  and benefits of the procedure and the sedation                            options and risks were discussed with the patient.                            All questions were answered, and informed consent                            was obtained. Prior Anticoagulants: The patient has                            taken aspirin, last dose was 1 day prior to                            procedure. ASA  Grade Assessment: III - A patient                            with severe systemic disease. After reviewing the                            risks and benefits, the patient was deemed in                            satisfactory condition to undergo the procedure.                           After obtaining informed consent, the endoscope was                            passed under direct vision. Throughout the                            procedure, the patient's blood pressure, pulse, and                            oxygen saturations were monitored continuously. The                            [id number]) scope was introduced through the                            mouth, and advanced to the second part of duodenum.                            The upper GI endoscopy was accomplished without                            difficulty. The patient tolerated the procedure                            well. Due to concerns over the stricture, the  pediatric endoscope was used. Scope In: Scope Out: Findings:      One mild benign-appearing, intrinsic stenosis was found at the       gastroesophageal junction. And was traversed. A guidewire was placed       under fluoroscopic guidance and the scope was withdrawn. Dilation was       performed with a Savary dilator with no resistance at 12 mm, 12.8 mm, 14       mm and 15 mm. only slight resistance with 15 mm dilator. No heme.      A large hiatal hernia was present.      There is no endoscopic evidence of bleeding, esophagitis or ulcerations       in the entire esophagus.      The stomach was normal.      The examined duodenum was normal. Impression:               - Benign-appearing esophageal stenosis. Dilated                            under fluoroscopic guidance to 15 mm.                           - Large hiatal hernia.                           - Normal stomach.                           - Normal examined duodenum.                            - No specimens collected. Moderate Sedation:      MAC by anesthesia Recommendation:           - Patient has a contact number available for                            emergencies. The signs and symptoms of potential                            delayed complications were discussed with the                            patient. Return to normal activities tomorrow.                            Written discharge instructions were provided to the                            patient.                           - Clear liquid diet for 6 hours.                           - Continue present medications.                           -  Return to endoscopist in 3 months. Procedure Code(s):        --- Professional ---                           [phone], Esophagogastroduodenoscopy, flexible,                            transoral; with insertion of guide wire followed by                            passage of dilator(s) through esophagus over guide                            wire                           [id number], Intraluminal dilation of strictures and/or                            obstructions (eg, esophagus), radiological                            supervision and interpretation Diagnosis Code(s):        --- Professional ---                           K22.2, Esophageal obstruction                           K44.9, Diaphragmatic hernia without obstruction or                            gangrene                           R13.10, Dysphagia, unspecified CPT copyright 2016 American Medical Association. All rights reserved. The codes documented in this report are preliminary and upon coder review may  be revised to meet current compliance requirements. Nancy Fetter Dr., MD 11/25/2016 10:09:30 AM This report has been signed electronically. Number of Addenda: 0

## 2016-11-25 NOTE — Anesthesia Postprocedure Evaluation (Signed)
Anesthesia Post Note  Patient: [name]  Procedure(s) Performed: ESOPHAGOGASTRODUODENOSCOPY (EGD) WITH PROPOFOL (N/A ) SAVORY DILATION (N/A )     Patient location during evaluation: PACU Anesthesia Type: MAC Level of consciousness: awake and alert Pain management: pain level controlled Vital Signs Assessment: post-procedure vital signs reviewed and stable Respiratory status: spontaneous breathing, nonlabored ventilation and respiratory function stable Cardiovascular status: stable and blood pressure returned to baseline Postop Assessment: no apparent nausea or vomiting Anesthetic complications: no    Last Vitals:  Vitals:   11/25/16 1030 11/25/16 1035  BP: (!) 153/56   Pulse: 70 74  Resp: 18 17  Temp:    SpO2: 100% 100%    Last Pain:  Vitals:   11/25/16 1013  TempSrc: Oral                 Lynda Rainwater

## 2016-11-25 NOTE — Anesthesia Preprocedure Evaluation (Signed)
Anesthesia Evaluation  Patient identified by MRN, date of birth, ID band Patient awake    Reviewed: Allergy & Precautions, H&P , NPO status , Patient's Chart, lab work & pertinent test results, reviewed documented beta blocker date and time   Airway Mallampati: II  TM Distance: <3 FB Neck ROM: Full    Dental no notable dental hx. (+) Dental Advisory Given   Pulmonary neg pulmonary ROS, former smoker,    Pulmonary exam normal breath sounds clear to auscultation       Cardiovascular hypertension, Pt. on medications and Pt. on home beta blockers + CAD  negative cardio ROS Normal cardiovascular exam Rhythm:Regular Rate:Normal     Neuro/Psych negative neurological ROS  negative psych ROS   GI/Hepatic Neg liver ROS, GERD  Medicated and Poorly Controlled,  Endo/Other  negative endocrine ROSdiabetes, Type 2, Oral Hypoglycemic Agents  Renal/GU negative Renal ROS  negative genitourinary   Musculoskeletal negative musculoskeletal ROS (+) Arthritis , Osteoarthritis,    Abdominal   Peds negative pediatric ROS (+)  Hematology negative hematology ROS (+)   Anesthesia Other Findings   Reproductive/Obstetrics negative OB ROS                             Anesthesia Physical  Anesthesia Plan  ASA: III  Anesthesia Plan: MAC   Post-op Pain Management:    Induction: Intravenous  PONV Risk Score and Plan: 1 and Ondansetron  Airway Management Planned: Nasal Cannula  Additional Equipment:   Intra-op Plan:   Post-operative Plan:   Informed Consent: I have reviewed the patients History and Physical, chart, labs and discussed the procedure including the risks, benefits and alternatives for the proposed anesthesia with the patient or authorized representative who has indicated his/her understanding and acceptance.   Dental advisory given  Plan Discussed with: CRNA and Surgeon  Anesthesia Plan  Comments:         Anesthesia Quick Evaluation

## 2016-11-25 NOTE — Discharge Instructions (Signed)
YOU HAD AN ENDOSCOPIC PROCEDURE TODAY: Refer to the procedure report and other information in the discharge instructions given to you for any specific questions about what was found during the examination. If this information does not answer your questions, please call [facility] office at [phone] to clarify.  ° °YOU SHOULD EXPECT: Some feelings of bloating in the abdomen. Passage of more gas than usual. Walking can help get rid of the air that was put into your GI tract during the procedure and reduce the bloating. If you had a lower endoscopy (such as a colonoscopy or flexible sigmoidoscopy) you may notice spotting of blood in your stool or on the toilet paper. Some abdominal soreness may be present for a day or two, also. ° °DIET: Your first meal following the procedure should be a light meal and then it is ok to progress to your normal diet. A half-sandwich or bowl of soup is an example of a good first meal. Heavy or fried foods are harder to digest and may make you feel nauseous or bloated. Drink plenty of fluids but you should avoid alcoholic beverages for 24 hours. If you had a esophageal dilation, please see attached instructions for diet.  ° °ACTIVITY: Your care partner should take you home directly after the procedure. You should plan to take it easy, moving slowly for the rest of the day. You can resume normal activity the day after the procedure however YOU SHOULD NOT DRIVE, use power tools, machinery or perform tasks that involve climbing or major physical exertion for 24 hours (because of the sedation medicines used during the test).  ° °SYMPTOMS TO REPORT IMMEDIATELY: °A gastroenterologist can be reached at any hour. Please call [phone]  for any of the following symptoms:  ° °Following upper endoscopy (EGD, EUS, ERCP, esophageal dilation) °Vomiting of blood or coffee ground material  °New, significant abdominal pain  °New, significant chest pain or pain under the shoulder blades  °Painful or  persistently difficult swallowing  °New shortness of breath  °Black, tarry-looking or red, bloody stools ° °FOLLOW UP:  °If any biopsies were taken you will be contacted by phone or by letter within the next 1-3 weeks. Call [phone]  if you have not heard about the biopsies in 3 weeks.  °Please also call with any specific questions about appointments or follow up tests. ° ° °Continue current meds. Clear liquids for 4-6 hours, if no chest pain or trouble breathing, soft foods tonight.  Regular diet tomorrow. Call for problems. ° °

## 2016-11-25 NOTE — H&P (Signed)
Subjective:   Patient is a 83 y.o. male presents with Dysphagia. He has had achalasia and has had prior Heller myotomy. He said some progressive increase reflux felt to be due to reflux into stricture.. Barium swallow shows achalasia with Mark dysmotility and a small stricture right above the GE junction. The patient is here for elective dilatation. Procedure including risks and benefits discussed in office.  Patient Active Problem List   Diagnosis Date Noted  . Dyslipidemia 10/05/2015  . CAD -S/P LAD and CFX DES 10/26/14 10/25/2014  . Abnormal nuclear cardiac imaging test 10/23/2014  . Carotid bruit-Rt-dopplers OK 10/23/2014  . GERD (gastroesophageal reflux disease) 10/23/2014  . 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 Essential hypertension 10/22/2014  . Chronic renal disease, stage III (HCC) 10/22/2014  . 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 Anginal pain (HCC) 10/21/2014  . Lap [facility] Myotomy Oct 2013 05/07/2012   Past Medical History:  Diagnosis Date  . Anemia   . Blood dyscrasia    ' free bleeder' per patient   . Coronary artery disease    3 coronary stents- followed by Dr Gwenlyn Saran   . Diabetes mellitus without complication (HCC)    type II   . Essential hypertension 10/22/2014  . GERD (gastroesophageal reflux disease)   . Multiple abrasions    on arms due to stumbling over chair on 11/19/16 per patient     Past Surgical History:  Procedure Laterality Date  . CARDIAC CATHETERIZATION N/A 10/25/2014   Procedure: Left Heart Cath and Coronary Angiography;  Surgeon: Kathleene Hazel, MD;  Location: [facility] INVASIVE CV LAB;  Service: Cardiovascular;  Laterality: N/A;  . CARDIAC CATHETERIZATION N/A 10/26/2014   Procedure: Coronary Stent Intervention;  Surgeon: Kathleene Hazel, MD;  Location: [facility] INVASIVE CV LAB;  Service: Cardiovascular;  Laterality: N/A;  . HELLER MYOTOMY  12/17/2011   Procedure: LAPAROSCOPIC HELLER  MYOTOMY;  Surgeon: Valarie Merino, MD;  Location: [facility] ORS;  Service: General;  Laterality: N/A;  . HERNIA REPAIR  1952  . JOINT REPLACEMENT  2007   rt hip   . UPPER GI ENDOSCOPY  12/17/2011   Procedure: UPPER GI ENDOSCOPY;  Surgeon: Valarie Merino, MD;  Location: [facility] ORS;  Service: General;  Laterality: N/A;    Prescriptions Prior to Admission  Medication Sig Dispense Refill Last Dose  . acetaminophen (TYLENOL) 325 MG tablet Take 2 tablets (650 mg total) by mouth every 4 (four) hours as needed for headache or mild pain. (Patient taking differently: Take 325 mg by mouth every 4 (four) hours as needed for headache or mild pain. )   Past Month at Unknown time  . amLODipine (NORVASC) 5 MG tablet Take 5 mg by mouth daily.   11/25/2016 at 0330  . aspirin 81 MG tablet Take 81 mg by mouth daily.   11/24/2016 at Unknown time  . atorvastatin (LIPITOR) 40 MG tablet Take 1 tablet (40 mg total) by mouth daily at 6 PM. (Patient taking differently: Take 20 mg by mouth daily at 6 PM. ) 30 tablet 11 11/24/2016 at Unknown time  . docusate sodium (COLACE) 100 MG capsule Take 100 mg by mouth daily.   11/24/2016 at Unknown time  . ferrous sulfate 325 (65 FE) MG tablet Take 325 mg by mouth daily. 3 days per week per patient- on Monday - Wednesday and Friday   Past Week at Unknown time  . metFORMIN (GLUCOPHAGE-XR) 500 MG 24 hr  tablet Take 1 tablet (500 mg total) by mouth every morning. (Patient taking differently: Take 500 mg by mouth every evening. )  5 11/24/2016 at Unknown time  . metoprolol tartrate (LOPRESSOR) 25 MG tablet TAKE 1 TABLET BY MOUTH TWICE DAILY (Patient taking differently: daily) 180 tablet 0 11/25/2016 at 0330  . metoprolol tartrate (LOPRESSOR) 25 MG tablet Take 25 mg by mouth daily.   11/25/2016 at 0330  . multivitamin-lutein (OCUVITE-LUTEIN) CAPS Take 1 capsule by mouth daily.   11/24/2016 at Unknown time  . omeprazole (PRILOSEC) 10 MG capsule Take 10 mg by mouth daily.   11/24/2016 at Unknown time   No  Known Allergies  Social History  Substance Use Topics  . Smoking status: Former Games developer  . Smokeless tobacco: Never Used     Comment: stopped 54yrs ago  . Alcohol use No    Family History  Problem Relation Age of Onset  . Heart disease Mother   . Heart disease Father      Objective:   Patient Vitals for the past 8 hrs:  BP Temp Temp src Pulse Resp SpO2 Height Weight  11/25/16 0817 (!) 151/63 97.7 F (36.5 C) Oral 71 18 98 %  (1.727 m) 75.8 kg (167 lb)   No intake/output data recorded. No intake/output data recorded.   See MD Preop evaluation      Assessment:   1. Dysphagia. Patient has achalasia in a reflux induced stricture on barium swallow  Plan:   1. Will proceed with severed dilatation with radiographic guidance.

## 2016-11-26 ENCOUNTER — Encounter (HOSPITAL_COMMUNITY): Payer: Self-pay | Admitting: Gastroenterology

## 2016-11-27 ENCOUNTER — Other Ambulatory Visit: Payer: Self-pay | Admitting: Cardiovascular Disease

## 2016-12-26 ENCOUNTER — Other Ambulatory Visit: Payer: Self-pay | Admitting: Cardiovascular Disease

## 2017-01-24 ENCOUNTER — Other Ambulatory Visit: Payer: Self-pay | Admitting: Cardiovascular Disease

## 2017-01-28 ENCOUNTER — Other Ambulatory Visit: Payer: Self-pay | Admitting: Cardiovascular Disease

## 2017-02-24 ENCOUNTER — Other Ambulatory Visit: Payer: Self-pay | Admitting: Cardiovascular Disease

## 2017-02-24 NOTE — Telephone Encounter (Signed)
Rx(s) sent to pharmacy electronically.  

## 2017-04-15 ENCOUNTER — Other Ambulatory Visit: Payer: Self-pay | Admitting: Cardiovascular Disease

## 2017-04-15 NOTE — Telephone Encounter (Signed)
REFILL 

## 2017-04-22 ENCOUNTER — Inpatient Hospital Stay (HOSPITAL_COMMUNITY)
Admission: EM | Admit: 2017-04-22 | Discharge: 2017-04-23 | DRG: 084 | Disposition: A | Discharge status: Home Health | Payer: Medicare Other | Attending: Internal Medicine | Admitting: Internal Medicine

## 2017-04-22 ENCOUNTER — Emergency Department (HOSPITAL_COMMUNITY): Payer: Medicare Other

## 2017-04-22 ENCOUNTER — Encounter (HOSPITAL_COMMUNITY): Payer: Self-pay

## 2017-04-22 DIAGNOSIS — Z7982 Long term (current) use of aspirin: Secondary | ICD-10-CM | POA: Diagnosis not present

## 2017-04-22 DIAGNOSIS — K219 Gastro-esophageal reflux disease without esophagitis: Secondary | ICD-10-CM | POA: Diagnosis not present

## 2017-04-22 DIAGNOSIS — Z7984 Long term (current) use of oral hypoglycemic drugs: Secondary | ICD-10-CM

## 2017-04-22 DIAGNOSIS — S06369A Traumatic hemorrhage of cerebrum, unspecified, with loss of consciousness of unspecified duration, initial encounter: Secondary | ICD-10-CM | POA: Diagnosis present

## 2017-04-22 DIAGNOSIS — Z96641 Presence of right artificial hip joint: Secondary | ICD-10-CM | POA: Diagnosis not present

## 2017-04-22 DIAGNOSIS — Y92129 Unspecified place in nursing home as the place of occurrence of the external cause: Secondary | ICD-10-CM

## 2017-04-22 DIAGNOSIS — N183 Chronic kidney disease, stage 3 unspecified: Secondary | ICD-10-CM | POA: Diagnosis present

## 2017-04-22 DIAGNOSIS — I129 Hypertensive chronic kidney disease with stage 1 through stage 4 chronic kidney disease, or unspecified chronic kidney disease: Secondary | ICD-10-CM | POA: Diagnosis present

## 2017-04-22 DIAGNOSIS — S065X9A Traumatic subdural hemorrhage with loss of consciousness of unspecified duration, initial encounter: Secondary | ICD-10-CM

## 2017-04-22 DIAGNOSIS — Z79899 Other long term (current) drug therapy: Secondary | ICD-10-CM | POA: Diagnosis not present

## 2017-04-22 DIAGNOSIS — E785 Hyperlipidemia, unspecified: Secondary | ICD-10-CM | POA: Diagnosis not present

## 2017-04-22 DIAGNOSIS — W19XXXA Unspecified fall, initial encounter: Secondary | ICD-10-CM | POA: Diagnosis not present

## 2017-04-22 DIAGNOSIS — Z955 Presence of coronary angioplasty implant and graft: Secondary | ICD-10-CM | POA: Diagnosis not present

## 2017-04-22 DIAGNOSIS — I609 Nontraumatic subarachnoid hemorrhage, unspecified: Secondary | ICD-10-CM

## 2017-04-22 DIAGNOSIS — I2510 Atherosclerotic heart disease of native coronary artery without angina pectoris: Secondary | ICD-10-CM | POA: Diagnosis present

## 2017-04-22 DIAGNOSIS — E119 Type 2 diabetes mellitus without complications: Secondary | ICD-10-CM | POA: Diagnosis present

## 2017-04-22 DIAGNOSIS — S0636AA Traumatic hemorrhage of cerebrum, unspecified, with loss of consciousness status unknown, initial encounter: Secondary | ICD-10-CM | POA: Diagnosis present

## 2017-04-22 DIAGNOSIS — E1122 Type 2 diabetes mellitus with diabetic chronic kidney disease: Secondary | ICD-10-CM | POA: Diagnosis present

## 2017-04-22 DIAGNOSIS — Z9861 Coronary angioplasty status: Secondary | ICD-10-CM

## 2017-04-22 DIAGNOSIS — Z87891 Personal history of nicotine dependence: Secondary | ICD-10-CM | POA: Diagnosis not present

## 2017-04-22 DIAGNOSIS — I10 Essential (primary) hypertension: Secondary | ICD-10-CM | POA: Diagnosis present

## 2017-04-22 DIAGNOSIS — I619 Nontraumatic intracerebral hemorrhage, unspecified: Secondary | ICD-10-CM

## 2017-04-22 NOTE — ED Triage Notes (Addendum)
[facility].  Per EMS, pt had mechanical fall, striking back of head with +LOC.  On arrival, pt has amnesia to event with pain to front of head.  VSS, CBG: 157

## 2017-04-22 NOTE — ED Notes (Signed)
Pt in CT.

## 2017-04-22 NOTE — ED Provider Notes (Signed)
[facility] EMERGENCY DEPARTMENT Provider Note   CSN: [id number] Arrival date & time:        History   Chief Complaint Chief Complaint  Patient presents with  . Fall    HPI [name] is a 84 y.o. male.  The history is provided by the patient, medical records, a relative and the spouse. No language interpreter was used.  Fall  This is a new problem. The current episode started 1 to 2 hours ago. The problem occurs constantly. The problem has not changed since onset.Associated symptoms include headaches. Pertinent negatives include no chest pain, no abdominal pain and no shortness of breath. Exacerbated by: sitting up. Nothing relieves the symptoms. He has tried nothing for the symptoms. The treatment provided no relief.    Past Medical History:  Diagnosis Date  . Anemia   . Blood dyscrasia    ' free bleeder' per patient   . Coronary artery disease    3 coronary stents- followed by Dr Gwenlyn Saran   . Diabetes mellitus without complication (HCC)    type II   . Essential hypertension 10/22/2014  . GERD (gastroesophageal reflux disease)   . Multiple abrasions    on arms due to stumbling over chair on 11/19/16 per patient     Patient Active Problem List   Diagnosis Date Noted  . Dyslipidemia 10/05/2015  . CAD -S/P LAD and CFX DES 10/26/14 10/25/2014  . Abnormal nuclear cardiac imaging test 10/23/2014  . Carotid bruit-Rt-dopplers OK 10/23/2014  . GERD (gastroesophageal reflux disease) 10/23/2014  . 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 Essential hypertension 10/22/2014  . Chronic renal disease, stage III (HCC) 10/22/2014  . 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 Anginal pain (HCC) 10/21/2014  . Lap [facility] Myotomy Oct 2013 05/07/2012    Past Surgical History:  Procedure Laterality Date  . CARDIAC CATHETERIZATION N/A 10/25/2014   Procedure: Left Heart Cath and Coronary Angiography;  Surgeon:  Kathleene Hazel, MD;  Location: [facility] INVASIVE CV LAB;  Service: Cardiovascular;  Laterality: N/A;  . CARDIAC CATHETERIZATION N/A 10/26/2014   Procedure: Coronary Stent Intervention;  Surgeon: Kathleene Hazel, MD;  Location: [facility] INVASIVE CV LAB;  Service: Cardiovascular;  Laterality: N/A;  . ESOPHAGOGASTRODUODENOSCOPY (EGD) WITH PROPOFOL N/A 11/25/2016   Procedure: ESOPHAGOGASTRODUODENOSCOPY (EGD) WITH PROPOFOL;  Surgeon: Carman Ching, MD;  Location: [facility] ENDOSCOPY;  Service: Endoscopy;  Laterality: N/A;  . HELLER MYOTOMY  12/17/2011   Procedure: LAPAROSCOPIC HELLER MYOTOMY;  Surgeon: Valarie Merino, MD;  Location: [facility] ORS;  Service: General;  Laterality: N/A;  . HERNIA REPAIR  1952  . JOINT REPLACEMENT  2007   rt hip   . SAVORY DILATION N/A 11/25/2016   Procedure: SAVORY DILATION;  Surgeon: Carman Ching, MD;  Location: [facility] ENDOSCOPY;  Service: Endoscopy;  Laterality: N/A;  . UPPER GI ENDOSCOPY  12/17/2011   Procedure: UPPER GI ENDOSCOPY;  Surgeon: Valarie Merino, MD;  Location: [facility] ORS;  Service: General;  Laterality: N/A;       Home Medications    Prior to Admission medications   Medication Sig Start Date End Date Taking? Authorizing Provider  amLODipine (NORVASC) 5 MG tablet Take 5 mg by mouth daily.   Yes [provider]  aspirin 81 MG tablet Take 81 mg by mouth daily.   Yes [provider]  atorvastatin (LIPITOR) 10 MG tablet Take 10 mg by mouth every evening. 02/21/17  Yes [provider]  ferrous sulfate 325 (65 FE) MG tablet Take 325 mg by mouth every Monday, Wednesday, and Friday.    Yes [provider]  metFORMIN (GLUCOPHAGE-XR) 500 MG 24 hr tablet Take 1 tablet (500 mg total) by mouth every morning. Patient taking differently: Take 500 mg by mouth every evening.  10/29/14  Yes Kilroy, Luke K, PA-C  metoprolol tartrate (LOPRESSOR) 25 MG tablet Take 1 tablet (25 mg total) by mouth 2 (two) times daily. NEED OV. 04/15/17  Yes Croitoru,  Mihai, MD  omeprazole (PRILOSEC) 10 MG capsule Take 10 mg by mouth daily.   Yes [provider]  acetaminophen (TYLENOL) 325 MG tablet Take 2 tablets (650 mg total) by mouth every 4 (four) hours as needed for headache or mild pain. Patient not taking: Reported on 04/22/2017 10/27/14   Abelino DerrickKilroy, Luke K, PA-C  atorvastatin (LIPITOR) 40 MG tablet Take 1 tablet (40 mg total) by mouth daily at 6 PM. Patient not taking: Reported on 04/22/2017 10/27/14   Abelino DerrickKilroy, Luke K, PA-C    Family History Family History  Problem Relation Age of Onset  . Heart disease Mother   . Heart disease Father     Social History Social History   Tobacco Use  . Smoking status: Former Games developermoker  . Smokeless tobacco: Never Used  . Tobacco comment: stopped 5221yrs ago  Substance Use Topics  . Alcohol use: No  . Drug use: No     Allergies   Patient has no known allergies.   Review of Systems Review of Systems  Constitutional: Positive for fatigue. Negative for chills, diaphoresis and fever.  HENT: Negative for congestion and rhinorrhea.   Eyes: Negative for visual disturbance.  Respiratory: Negative for cough, chest tightness and shortness of breath.   Cardiovascular: Negative for chest pain.  Gastrointestinal: Positive for nausea. Negative for abdominal pain, constipation, diarrhea and vomiting.  Genitourinary: Negative for dysuria, flank pain and frequency.  Musculoskeletal: Negative for back pain, neck pain and neck stiffness.  Neurological: Positive for light-headedness and headaches. Negative for dizziness, seizures, speech difficulty and weakness.  Psychiatric/Behavioral: Negative for agitation.  All other systems reviewed and are negative.    Physical Exam Updated Vital Signs BP (!) 174/69 (BP Location: Right Arm)   Pulse 74   Temp 98.5 F (36.9 C) (Oral)   Resp 18   Ht 5\' 10"  (1.778 m)   Wt 77.1 kg (170 lb)   SpO2 97%   BMI 24.39 kg/m   Physical Exam  Constitutional: He is oriented to  person, place, and time. He appears well-developed and well-nourished. No distress.  HENT:  Head: Normocephalic.  Mouth/Throat: Oropharynx is clear and moist. No oropharyngeal exudate.  Eyes: Conjunctivae and EOM are normal. Pupils are equal, round, and reactive to light.  Neck: Normal range of motion. Neck supple.  Cardiovascular: Normal rate and intact distal pulses.  No murmur heard. Pulmonary/Chest: Effort normal and breath sounds normal. No respiratory distress. He has no wheezes. He exhibits no tenderness.  Abdominal: Soft. Bowel sounds are normal. He exhibits no distension. There is no tenderness.  Musculoskeletal: He exhibits no edema, tenderness or deformity.  Neurological: He is alert and oriented to person, place, and time. He is not disoriented. He displays no tremor and normal reflexes. No cranial nerve deficit or sensory deficit. He exhibits normal muscle tone. He displays no seizure activity. Coordination normal. Abnormal gait: deferred. GCS eye subscore is 4. GCS verbal subscore is 5. GCS motor subscore is 6.  Pt did not tolerate sitting up, gait deferred.   Skin: He is not diaphoretic.  Nursing note and vitals reviewed.    ED Treatments / Results  Labs (all labs ordered are listed, but only abnormal results are displayed) Labs Reviewed  CBC WITH DIFFERENTIAL/PLATELET - Abnormal; Notable for the following components:      Result Value   WBC 11.7 (*)    RBC 3.61 (*)    Hemoglobin 10.6 (*)    HCT 32.3 (*)    Neutro Abs 9.7 (*)    All other components within normal limits  BASIC METABOLIC PANEL - Abnormal; Notable for the following components:   CO2 20 (*)    Glucose, Bld 173 (*)    BUN 34 (*)    Creatinine, Ser 2.07 (*)    GFR calc non Af Amer 26 (*)    GFR calc Af Amer 30 (*)    All other components within normal limits  PROTIME-INR    EKG  EKG Interpretation  Date/Time:  Wednesday April 23 2017 00:41:56 EST Ventricular Rate:  73 PR Interval:    QRS  Duration: 129 QT Interval:  416 QTC Calculation: 459 R Axis:   51 Text Interpretation:  Sinus rhythm Ventricular premature complex Left bundle branch block When compared to prior, similar PVC. No STEMI Confirmed by Theda Belfast ([id number]) on 04/23/2017 2:44:34 AM       Radiology Ct Head Wo Contrast  Result Date: 04/22/2017 CLINICAL DATA:  Fall at nursing facility striking back of head. Positive loss of consciousness. Neck pain. Initial exam. EXAM: CT HEAD WITHOUT CONTRAST CT CERVICAL SPINE WITHOUT CONTRAST TECHNIQUE: Multidetector CT imaging of the head and cervical spine was performed following the standard protocol without intravenous contrast. Multiplanar CT image reconstructions of the cervical spine were also generated. COMPARISON:  Brain MRI 07/31/2012 FINDINGS: CT HEAD FINDINGS Brain: Multifocal intracranial hemorrhage. Intraparenchymal hemorrhage in the left frontal lobe measures 1.6 x 1.4 x 1.6 mm (volume = 1.9 mm^3). Multifocal small volumesubarachnoid hemorrhage in the left frontal and temporal lobes. Small subdural hemorrhage tracks along the superior falx, left frontal adjacent intraparenchymal hemorrhage and bilateral tentorium. 5 mm extra-axial hemorrhage in the left temporal region favored to be subdural rather than epidural. There is minimal 3 mm left-to-right midline shift. Diffuse cerebral atrophy. Moderate chronic small vessel ischemia. Vascular: Atherosclerosis of skullbase vasculature without hyperdense vessel or abnormal calcification. Skull: No skull fracture. Sinuses/Orbits: Paranasal sinuses the mastoid air cells are clear. Bilateral cataract resection. Other: None. CT CERVICAL SPINE FINDINGS Alignment: Normal. Skull base and vertebrae: No acute fracture. Vertebral body heights are maintained. The dens and skull base are intact. Soft tissues and spinal canal: No prevertebral fluid or swelling. No visible canal hematoma. Disc levels: Diffuse disc space narrowing and endplate  spurring. Multilevel facet arthropathy. Upper chest: No acute finding. Other: Carotid calcifications. IMPRESSION: 1. Intracranial hemorrhage. Left frontal intraparenchymal hematoma volume of 1.9 cc. Scattered left subarachnoid hemorrhage in the frontal and temporal lobes. Subdural hematoma tracking along the left frontal region, bilateral tentorium and superior falx. 5 mm thickness extra-axial hemorrhage left temporal region likely also subdural, less likely epidural. There is 3 mm left-to-right midline shift. 2. No skull fracture. 3. Multilevel degenerative change in the cervical spine without acute fracture. Critical Value/emergent results were discussed by telephone at the time of interpretation on 04/22/2017 at 11:41 pm to Dr. Lynden Oxford , who verbally acknowledged these results. Electronically Signed   By: Rubye Oaks M.D.   On: 04/22/2017  23:42   Ct Cervical Spine Wo Contrast  Result Date: 04/22/2017 CLINICAL DATA:  Fall at nursing facility striking back of head. Positive loss of consciousness. Neck pain. Initial exam. EXAM: CT HEAD WITHOUT CONTRAST CT CERVICAL SPINE WITHOUT CONTRAST TECHNIQUE: Multidetector CT imaging of the head and cervical spine was performed following the standard protocol without intravenous contrast. Multiplanar CT image reconstructions of the cervical spine were also generated. COMPARISON:  Brain MRI 07/31/2012 FINDINGS: CT HEAD FINDINGS Brain: Multifocal intracranial hemorrhage. Intraparenchymal hemorrhage in the left frontal lobe measures 1.6 x 1.4 x 1.6 mm (volume = 1.9 mm^3). Multifocal small volumesubarachnoid hemorrhage in the left frontal and temporal lobes. Small subdural hemorrhage tracks along the superior falx, left frontal adjacent intraparenchymal hemorrhage and bilateral tentorium. 5 mm extra-axial hemorrhage in the left temporal region favored to be subdural rather than epidural. There is minimal 3 mm left-to-right midline shift. Diffuse cerebral atrophy.  Moderate chronic small vessel ischemia. Vascular: Atherosclerosis of skullbase vasculature without hyperdense vessel or abnormal calcification. Skull: No skull fracture. Sinuses/Orbits: Paranasal sinuses the mastoid air cells are clear. Bilateral cataract resection. Other: None. CT CERVICAL SPINE FINDINGS Alignment: Normal. Skull base and vertebrae: No acute fracture. Vertebral body heights are maintained. The dens and skull base are intact. Soft tissues and spinal canal: No prevertebral fluid or swelling. No visible canal hematoma. Disc levels: Diffuse disc space narrowing and endplate spurring. Multilevel facet arthropathy. Upper chest: No acute finding. Other: Carotid calcifications. IMPRESSION: 1. Intracranial hemorrhage. Left frontal intraparenchymal hematoma volume of 1.9 cc. Scattered left subarachnoid hemorrhage in the frontal and temporal lobes. Subdural hematoma tracking along the left frontal region, bilateral tentorium and superior falx. 5 mm thickness extra-axial hemorrhage left temporal region likely also subdural, less likely epidural. There is 3 mm left-to-right midline shift. 2. No skull fracture. 3. Multilevel degenerative change in the cervical spine without acute fracture. Critical Value/emergent results were discussed by telephone at the time of interpretation on 04/22/2017 at 11:41 pm to Dr. Lynden Oxford , who verbally acknowledged these results. Electronically Signed   By: Rubye Oaks M.D.   On: 04/22/2017 23:42    Procedures Procedures (including critical care time)  CRITICAL CARE Performed by: Canary Brim Lahela Woodin Total critical care time: 35 minutes Critical care time was exclusive of separately billable procedures and treating other patients. Traumatic Intraparenchymal/sub arachnoid, and subdural bleeding.  Critical care was necessary to treat or prevent imminent or life-threatening deterioration. Critical care was time spent personally by me on the following  activities: development of treatment plan with patient and/or surrogate as well as nursing, discussions with consultants, evaluation of patient's response to treatment, examination of patient, obtaining history from patient or surrogate, ordering and performing treatments and interventions, ordering and review of laboratory studies, ordering and review of radiographic studies, pulse oximetry and re-evaluation of patient's condition.   Medications Ordered in ED Medications  atorvastatin (LIPITOR) tablet 10 mg (not administered)  amLODipine (NORVASC) tablet 5 mg (not administered)  ferrous sulfate tablet 325 mg (not administered)  metoprolol tartrate (LOPRESSOR) tablet 25 mg (25 mg Oral Given 04/23/17 0046)  insulin aspart (novoLOG) injection 0-9 Units (not administered)  acetaminophen (TYLENOL) tablet 650 mg (not administered)    Or  acetaminophen (TYLENOL) suppository 650 mg (not administered)  ondansetron (ZOFRAN) tablet 4 mg (not administered)    Or  ondansetron (ZOFRAN) injection 4 mg (not administered)  morphine 4 MG/ML injection 1 mg (not administered)     Initial Impression / Assessment and Plan / ED Course  I have reviewed the triage vital signs and the nursing notes.  Pertinent labs & imaging results that were available during my care of the patient were reviewed by me and considered in my medical decision making (see chart for details).     [name] is a 84 y.o. male with a past medical history significant for CKD, hypertension, CAD with PCI, dyslipidemia, and diabetes who presents with a fall.  Patient reports that he was with his wife at their nursing facility when he had a mechanical fall tripping over her rolling walker.  He was seen to fall hitting the back of his head on the ground and is unsure loss of consciousness.  He does not remember the fall.  Patient reports some lightheadedness and nausea but no vomiting.  He denies any numbness, tingling, or weakness of  extremities but he does feel lightheaded when he sits up.  He reports headache in his left  frontal area of his head.  Also some pain in the back of his head.  No significant neck pain.  On exam, patient had no focal neurologic deficits however gait was deferred as patient was very lightheaded sitting up.  Patient had normal pupil exam and normal extraocular movements.  Tenderness present on the back of his head with a small hematoma.  No laceration seen.  Patient had normal finger-nose-finger, grip strength, and leg strength.  Normal sensation throughout.  CT head and neck was ordered.  CT head revealed a left intraparenchymal hemorrhage, subarachnoid hemorrhage, and subdural.  Admission laboratory testing will be ordered.   11:54 PM Dr. Dutch Quint with neurosurgery was called who reviewed the imaging.  He feels the patient would benefit from a 24-hour CT head to monitor changes as well as admission to medicine service for every 4 hours neuro checks.  Patient's PCP is Dr. Pete Glatter, will call hospitalist service for admission.   Final Clinical Impressions(s) / ED Diagnoses   Final diagnoses:  Traumatic subdural hemorrhage with loss of consciousness, initial encounter (HCC)  SAH (subarachnoid hemorrhage) (HCC)  Intraparenchymal hemorrhage of brain (HCC)     Clinical Impression: 1. Traumatic subdural hemorrhage with loss of consciousness, initial encounter (HCC)   2. SAH (subarachnoid hemorrhage) (HCC)   3. Intraparenchymal hemorrhage of brain [facility])     Disposition: Admit  This note was prepared with assistance of Dragon voice recognition software. Occasional wrong-word or sound-a-like substitutions may have occurred due to the inherent limitations of voice recognition software.      Emmanuela Ghazi, Canary Brim, MD 04/23/17 [phone]

## 2017-04-23 ENCOUNTER — Inpatient Hospital Stay (HOSPITAL_COMMUNITY): Payer: Medicare Other

## 2017-04-23 DIAGNOSIS — S06360A Traumatic hemorrhage of cerebrum, unspecified, without loss of consciousness, initial encounter: Secondary | ICD-10-CM | POA: Diagnosis not present

## 2017-04-23 DIAGNOSIS — Z955 Presence of coronary angioplasty implant and graft: Secondary | ICD-10-CM | POA: Diagnosis not present

## 2017-04-23 DIAGNOSIS — S0636AA Traumatic hemorrhage of cerebrum, unspecified, with loss of consciousness status unknown, initial encounter: Secondary | ICD-10-CM | POA: Diagnosis present

## 2017-04-23 DIAGNOSIS — Z79899 Other long term (current) drug therapy: Secondary | ICD-10-CM | POA: Diagnosis not present

## 2017-04-23 DIAGNOSIS — W19XXXA Unspecified fall, initial encounter: Secondary | ICD-10-CM | POA: Diagnosis not present

## 2017-04-23 DIAGNOSIS — I619 Nontraumatic intracerebral hemorrhage, unspecified: Secondary | ICD-10-CM

## 2017-04-23 DIAGNOSIS — E785 Hyperlipidemia, unspecified: Secondary | ICD-10-CM | POA: Diagnosis not present

## 2017-04-23 DIAGNOSIS — E1122 Type 2 diabetes mellitus with diabetic chronic kidney disease: Secondary | ICD-10-CM | POA: Diagnosis not present

## 2017-04-23 DIAGNOSIS — I2510 Atherosclerotic heart disease of native coronary artery without angina pectoris: Secondary | ICD-10-CM

## 2017-04-23 DIAGNOSIS — S065X9A Traumatic subdural hemorrhage with loss of consciousness of unspecified duration, initial encounter: Principal | ICD-10-CM

## 2017-04-23 DIAGNOSIS — S06369A Traumatic hemorrhage of cerebrum, unspecified, with loss of consciousness of unspecified duration, initial encounter: Secondary | ICD-10-CM | POA: Diagnosis present

## 2017-04-23 DIAGNOSIS — Z96641 Presence of right artificial hip joint: Secondary | ICD-10-CM | POA: Diagnosis not present

## 2017-04-23 DIAGNOSIS — Z7984 Long term (current) use of oral hypoglycemic drugs: Secondary | ICD-10-CM | POA: Diagnosis not present

## 2017-04-23 DIAGNOSIS — Z87891 Personal history of nicotine dependence: Secondary | ICD-10-CM | POA: Diagnosis not present

## 2017-04-23 DIAGNOSIS — N183 Chronic kidney disease, stage 3 (moderate): Secondary | ICD-10-CM | POA: Diagnosis not present

## 2017-04-23 DIAGNOSIS — Y92129 Unspecified place in nursing home as the place of occurrence of the external cause: Secondary | ICD-10-CM | POA: Diagnosis not present

## 2017-04-23 DIAGNOSIS — Z7982 Long term (current) use of aspirin: Secondary | ICD-10-CM | POA: Diagnosis not present

## 2017-04-23 DIAGNOSIS — Z9861 Coronary angioplasty status: Secondary | ICD-10-CM

## 2017-04-23 DIAGNOSIS — I10 Essential (primary) hypertension: Secondary | ICD-10-CM

## 2017-04-23 DIAGNOSIS — I129 Hypertensive chronic kidney disease with stage 1 through stage 4 chronic kidney disease, or unspecified chronic kidney disease: Secondary | ICD-10-CM | POA: Diagnosis not present

## 2017-04-23 DIAGNOSIS — K219 Gastro-esophageal reflux disease without esophagitis: Secondary | ICD-10-CM

## 2017-04-23 LAB — CBC WITH DIFFERENTIAL/PLATELET
Basophils Absolute: 0 10*3/uL (ref 0.0–0.1)
Basophils Relative: 0 %
EOS PCT: 0 %
Eosinophils Absolute: 0.1 10*3/uL (ref 0.0–0.7)
HCT: 32.3 % — ABNORMAL LOW (ref 39.0–52.0)
Hemoglobin: 10.6 g/dL — ABNORMAL LOW (ref 13.0–17.0)
LYMPHS PCT: 12 %
Lymphs Abs: 1.4 10*3/uL (ref 0.7–4.0)
MCH: 29.4 pg (ref 26.0–34.0)
MCHC: 32.8 g/dL (ref 30.0–36.0)
MCV: 89.5 fL (ref 78.0–100.0)
MONO ABS: 0.5 10*3/uL (ref 0.1–1.0)
Monocytes Relative: 4 %
Neutro Abs: 9.7 10*3/uL — ABNORMAL HIGH (ref 1.7–7.7)
Neutrophils Relative %: 84 %
PLATELETS: 186 10*3/uL (ref 150–400)
RBC: 3.61 MIL/uL — AB (ref 4.22–5.81)
RDW: 14 % (ref 11.5–15.5)
WBC: 11.7 10*3/uL — AB (ref 4.0–10.5)

## 2017-04-23 LAB — BASIC METABOLIC PANEL
Anion gap: 11 (ref 5–15)
Anion gap: 13 (ref 5–15)
BUN: 30 mg/dL — ABNORMAL HIGH (ref 6–20)
BUN: 34 mg/dL — AB (ref 6–20)
CHLORIDE: 106 mmol/L (ref 101–111)
CO2: 20 mmol/L — ABNORMAL LOW (ref 22–32)
CO2: 19 mmol/L — ABNORMAL LOW (ref 22–32)
CREATININE: 1.98 mg/dL — AB (ref 0.61–1.24)
Calcium: 9 mg/dL (ref 8.9–10.3)
Calcium: 8.8 mg/dL — ABNORMAL LOW (ref 8.9–10.3)
Chloride: 107 mmol/L (ref 101–111)
Creatinine, Ser: 2.07 mg/dL — ABNORMAL HIGH (ref 0.61–1.24)
GFR calc Af Amer: 30 mL/min — ABNORMAL LOW (ref >60)
GFR, EST AFRICAN AMERICAN: 32 mL/min — AB (ref >60)
GFR, EST NON AFRICAN AMERICAN: 26 mL/min — AB (ref >60)
GFR, EST NON AFRICAN AMERICAN: 27 mL/min — AB (ref >60)
GLUCOSE: 173 mg/dL — AB (ref 65–99)
Glucose, Bld: 168 mg/dL — ABNORMAL HIGH (ref 65–99)
POTASSIUM: 4 mmol/L (ref 3.5–5.1)
Potassium: 3.8 mmol/L (ref 3.5–5.1)
SODIUM: 138 mmol/L (ref 135–145)
Sodium: 138 mmol/L (ref 135–145)

## 2017-04-23 LAB — GLUCOSE, CAPILLARY
GLUCOSE-CAPILLARY: 131 mg/dL — AB (ref 65–99)
GLUCOSE-CAPILLARY: 125 mg/dL — AB (ref 65–99)
Glucose-Capillary: 94 mg/dL (ref 65–99)

## 2017-04-23 LAB — PROTIME-INR
INR: 1.1
Prothrombin Time: 14.1 seconds (ref 11.4–15.2)

## 2017-04-23 MED ORDER — ATORVASTATIN CALCIUM 10 MG PO TABS
10.0000 mg | ORAL_TABLET | Freq: Every evening | ORAL | Status: DC
  Administered 2017-04-23: 10 mg via ORAL
  Filled 2017-04-23: qty 1

## 2017-04-23 MED ORDER — METOPROLOL TARTRATE 25 MG PO TABS
25.0000 mg | ORAL_TABLET | Freq: Two times a day (BID) | ORAL | Status: DC
  Administered 2017-04-23: 25 mg via ORAL
  Filled 2017-04-23 (×2): qty 1

## 2017-04-23 MED ORDER — AMLODIPINE BESYLATE 5 MG PO TABS
5.0000 mg | ORAL_TABLET | Freq: Every day | ORAL | Status: DC
  Filled 2017-04-23: qty 1

## 2017-04-23 MED ORDER — FERROUS SULFATE 325 (65 FE) MG PO TABS
325.0000 mg | ORAL_TABLET | ORAL | Status: DC
  Administered 2017-04-23: 325 mg via ORAL
  Filled 2017-04-23: qty 1

## 2017-04-23 MED ORDER — METOPROLOL TARTRATE 25 MG PO TABS: 25.0000 mg | ORAL_TABLET | Freq: Two times a day (BID) | ORAL | Status: DC

## 2017-04-23 MED ORDER — AMLODIPINE BESYLATE 5 MG PO TABS
5.0000 mg | ORAL_TABLET | Freq: Every day | ORAL | Status: DC
  Administered 2017-04-23: 5 mg via ORAL
  Filled 2017-04-23: qty 1

## 2017-04-23 MED ORDER — ONDANSETRON HCL 4 MG PO TABS: 4.0000 mg | ORAL_TABLET | Freq: Four times a day (QID) | ORAL | Status: DC | PRN

## 2017-04-23 MED ORDER — ACETAMINOPHEN 325 MG PO TABS
650.0000 mg | ORAL_TABLET | Freq: Four times a day (QID) | ORAL | Status: DC | PRN
  Administered 2017-04-23: 650 mg via ORAL
  Filled 2017-04-23: qty 2

## 2017-04-23 MED ORDER — INSULIN ASPART 100 UNIT/ML ~~LOC~~ SOLN
0.0000 [IU] | Freq: Three times a day (TID) | SUBCUTANEOUS | Status: DC
  Administered 2017-04-23 (×2): 1 [IU] via SUBCUTANEOUS

## 2017-04-23 MED ORDER — ACETAMINOPHEN 650 MG RE SUPP: 650.0000 mg | Freq: Four times a day (QID) | RECTAL | Status: DC | PRN

## 2017-04-23 MED ORDER — ONDANSETRON HCL 4 MG/2ML IJ SOLN: 4.0000 mg | Freq: Four times a day (QID) | INTRAMUSCULAR | Status: DC | PRN

## 2017-04-23 MED ORDER — MORPHINE SULFATE (PF) 4 MG/ML IV SOLN: 1.0000 mg | INTRAVENOUS | Status: DC | PRN

## 2017-04-23 NOTE — Evaluation (Signed)
Physical Therapy Evaluation Patient Details Name: [name] MRN: [id number] DOB: May 29, 1922 Today's Date: 04/23/2017   History of Present Illness  84 yo male presenting to the ED after a fall in which he hit his head. CT shows intracerebal hemmorhage in L frontal lobe, subarachnoid hematoma in L frontal and lateral lobes, subdural hematoma in the L frontal lobe/tentorium/superior falx, L temporal hemorrhage, and 59m midline shift. Imaging negative for skull or cervical fracture. PMH CAD, DM, HTN, cardiac cath, R hip replacement, hernia repair, heller myotomy   Clinical Impression   Patient received in bed, very pleasant and willing to participate with PT this morning. He does require MinA for functional bed mobility mostly due to dizziness rather than weakness or difficulty with task; dizziness quickly resolved and did not return during session however. He also requires light MinA for sit to stand from the bed, and was able to ambulate approximately 654fwith RW today; he is very unsteady during static standing marching task with no UE support today, thus did not attempt gait without assistive device. He was left up in the chair with all needs met, chair alarm set, family present this afternoon. He will continue to benefit from skilled PT services in the acute setting as well as skilled HHPT services to further address functional deficits and reduce fall risk moving forward.      Follow Up Recommendations Home health PT    Equipment Recommendations  Rolling walker with 5" wheels;Other (comment)(bedside commode, shower seat )    Recommendations for Other Services       Precautions / Restrictions Precautions Precautions: Fall;Other (comment) Precaution Comments: HOH  Restrictions Weight Bearing Restrictions: No      Mobility  Bed Mobility Overal bed mobility: Needs Assistance Bed Mobility: Supine to Sit     Supine to sit: Min assist     General bed mobility comments: due to  short onset of dizziness, quickly recovered and was able to complete transfer   Transfers Overall transfer level: Needs assistance Equipment used: Rolling walker (2 wheeled) Transfers: Sit to/from Stand Sit to Stand: Min assist         General transfer comment: very light MinA to boost to full standing position, cues for safety   Ambulation/Gait Ambulation/Gait assistance: Min guard;Min assist Ambulation Distance (Feet): 60 Feet Assistive device: Rolling walker (2 wheeled) Gait Pattern/deviations: Step-through pattern;Decreased step length - right;Decreased step length - left;Decreased stride length     General Gait Details: steady at self-selected pace with RW requiring mostly min guard; did have one episode when turning in room where he did require MinA to maintain balance and prevent fall   Stairs            Wheelchair Mobility    Modified Rankin (Stroke Patients Only)       Balance Overall balance assessment: Needs assistance;History of Falls Sitting-balance support: Bilateral upper extremity supported;Feet supported Sitting balance-Leahy Scale: Good     Standing balance support: Bilateral upper extremity supported;During functional activity Standing balance-Leahy Scale: Fair Standing balance comment: reliant on UE support, difficulty on turns                              Pertinent Vitals/Pain Pain Assessment: No/denies pain    Home Living Family/patient expects to be discharged to:: Private residence Living Arrangements: Spouse/significant other Available Help at Discharge: Family Type of Home: House Home Access: Level entry     Home Layout:  One level Home Equipment: Cane - single point;Shower seat      Prior Function Level of Independence: Independent         Comments: patient states he only sometimes used his cane; daughter present and confirms, also reports that while they do not live in formal assistive living, their community  is very protective and lots of assistance/help is available      Hand Dominance        Extremity/Trunk Assessment        Lower Extremity Assessment Lower Extremity Assessment: Generalized weakness    Cervical / Trunk Assessment Cervical / Trunk Assessment: Kyphotic  Communication   Communication: HOH  Cognition Arousal/Alertness: Awake/alert Behavior During Therapy: WFL for tasks assessed/performed Overall Cognitive Status: Within Functional Limits for tasks assessed                                        General Comments      Exercises     Assessment/Plan    PT Assessment Patient needs continued PT services  PT Problem List Decreased strength;Decreased mobility;Decreased coordination;Decreased balance       PT Treatment Interventions DME instruction;Therapeutic activities;Gait training;Therapeutic exercise;Patient/family education;Stair training;Balance training;Functional mobility training;Neuromuscular re-education    PT Goals (Current goals can be found in the Care Plan section)  Acute Rehab PT Goals Patient Stated Goal: to go home, prevent more falls  PT Goal Formulation: With patient/family Time For Goal Achievement: 05/07/17 Potential to Achieve Goals: Good    Frequency Min 3X/week   Barriers to discharge        Co-evaluation               AM-PAC PT "6 Clicks" Daily Activity  Outcome Measure Difficulty turning over in bed (including adjusting bedclothes, sheets and blankets)?: Unable Difficulty moving from lying on back to sitting on the side of the bed? : Unable Difficulty sitting down on and standing up from a chair with arms (e.g., wheelchair, bedside commode, etc,.)?: Unable Help needed moving to and from a bed to chair (including a wheelchair)?: A Little Help needed walking in hospital room?: A Little Help needed climbing 3-5 steps with a railing? : A Little 6 Click Score: 12    End of Session Equipment Utilized  During Treatment: Gait belt Activity Tolerance: Patient tolerated treatment well Patient left: in chair;with chair alarm set;with family/visitor present;with call bell/phone within reach   PT Visit Diagnosis: Unsteadiness on feet (R26.81);Muscle weakness (generalized) (M62.81);Other abnormalities of gait and mobility (R26.89);History of falling (Z91.81)    Time: 6147-0929 PT Time Calculation (min) (ACUTE ONLY): 30 min   Charges:   PT Evaluation $PT Eval Low Complexity: 1 Low PT Treatments $Gait Training: 8-22 mins   PT G Codes:        Deniece Ree PT, DPT, CBIS  Supplemental Physical Therapist [place]   Pager [phone]

## 2017-04-23 NOTE — Progress Notes (Addendum)
18 30: Patient and family given discharge teaching and follow up instructions. IV and tele removed. Able to verbalize understanding and teach back. No new questions or concerns.   Family to provide transport, staff discharged from unit by wheelchair. Signed discharge summary in chart.

## 2017-04-23 NOTE — Care Management Note (Addendum)
Case Management Note  Patient Details  Name: Lavonda JumboHerley G Dacy MRN: [id number] Date of Birth: Feb 18, 1923  Subjective/Objective:                    Action/Plan: [facility] [place] [facility] CM called [facility] and they asked that it be resourced out. They use [facility]. Lupita [facility]Donna [facility] notified and accepted the [facility]. Family at bedside and will [facility]. Bedside RN updated.   Expected Discharge Date:  04/23/17               Expected Discharge Plan:  Home w Home Health Services  In-House Referral:     Discharge planning Services  CM Consult  Post Acute Care Choice:  Home Health Choice offered to:  Patient, Spouse, Adult Children  DME Arranged:    DME Agency:     HH Arranged:  PT, Nurse's Aide [facility] Agency:  [facility]  Status of Service:  Completed, signed off  If discussed at [employer] Meetings, dates discussed:    Additional Comments:  Kermit BaloKelli F Charlye Spare, RN 04/23/2017, 3:55 PM

## 2017-04-23 NOTE — H&P (Signed)
History and Physical    [name] UJW:[id number] DOB: 12-11-1922 DOA: 04/22/2017  PCP: Merlene LaughterStoneking, Hal, MD  [facility] from: [facility]  I have personally briefly reviewed patient's [facility]  Chief Complaint: Larey SeatFell, hit head  HPI: [name] is a 84 y.o. male with medical history significant of DM2, HTN.  [facility].  Headache, nausea, no vomiting.  No neuro deficits.  Patient mental status is actually at baseline per daughter who is at bedside.   ED Course: CT scan shows SDH, SAH, and IPH, small amounts (see report for details).  Dr. Dutch QuintPoole called by EDP, hospitalist asked to admit.   Review of Systems: As per HPI otherwise 10 point review of systems negative.   Past Medical History:  Diagnosis Date  . Anemia   . Blood dyscrasia    ' free bleeder' per patient   . Coronary artery disease    3 coronary stents- followed by Dr Gwenlyn Saranroituri   . Diabetes mellitus without complication (HCC)    type II   . Essential hypertension 10/22/2014  . GERD (gastroesophageal reflux disease)   . Multiple abrasions    on arms due to stumbling over chair on 11/19/16 per patient     Past Surgical History:  Procedure Laterality Date  . CARDIAC CATHETERIZATION N/A 10/25/2014   Procedure: Left Heart Cath and Coronary Angiography;  Surgeon: Kathleene Hazelhristopher D McAlhany, MD;  [facility];  Service: Cardiovascular;  Laterality: N/A;  . CARDIAC CATHETERIZATION N/A 10/26/2014   Procedure: Coronary Stent Intervention;  Surgeon: Kathleene Hazelhristopher D McAlhany, MD;  Location: [facility] INVASIVE CV LAB;  Service: Cardiovascular;  Laterality: N/A;  . ESOPHAGOGASTRODUODENOSCOPY (EGD) WITH PROPOFOL N/A 11/25/2016   Procedure: ESOPHAGOGASTRODUODENOSCOPY (EGD) WITH PROPOFOL;  Surgeon: Carman ChingEdwards, James, MD;  Location: [facility] ENDOSCOPY;  Service: Endoscopy;  Laterality: N/A;  . HELLER MYOTOMY  12/17/2011   Procedure:  LAPAROSCOPIC HELLER MYOTOMY;  Surgeon: Valarie MerinoMatthew B Martin, MD;  Location: [facility] ORS;  Service: General;  Laterality: N/A;  . HERNIA REPAIR  1952  . JOINT REPLACEMENT  2007   rt hip   . SAVORY DILATION N/A 11/25/2016   Procedure: SAVORY DILATION;  Surgeon: Carman ChingEdwards, James, MD;  Location: [facility] ENDOSCOPY;  Service: Endoscopy;  Laterality: N/A;  . UPPER GI ENDOSCOPY  12/17/2011   Procedure: UPPER GI ENDOSCOPY;  Surgeon: Valarie MerinoMatthew B Martin, MD;  Location: [facility] ORS;  Service: General;  Laterality: N/A;     reports that he has quit smoking. he has never used smokeless tobacco. He reports that he does not drink alcohol or use drugs.  No Known Allergies  Family History  Problem Relation Age of Onset  . Heart disease Mother   . Heart disease Father      Prior to Admission medications   Medication Sig Start Date End Date Taking? Authorizing Provider  amLODipine (NORVASC) 5 MG tablet Take 5 mg by mouth daily.   Yes [provider]  aspirin 81 MG tablet Take 81 mg by mouth daily.   Yes [provider]  atorvastatin (LIPITOR) 10 MG tablet Take 10 mg by mouth every evening. 02/21/17  Yes [provider]  ferrous sulfate 325 (65 FE) MG tablet Take 325 mg by mouth every Monday, Wednesday, and Friday.    Yes [provider]  metFORMIN (GLUCOPHAGE-XR) 500 MG 24 hr tablet Take 1 tablet (500 mg total) by mouth every morning.  Patient taking differently: Take 500 mg by mouth every evening.  10/29/14  Yes Kilroy, Luke K, PA-C  metoprolol tartrate (LOPRESSOR) 25 MG tablet Take 1 tablet (25 mg total) by mouth 2 (two) times daily. NEED OV. 04/15/17  Yes Croitoru, Mihai, MD  omeprazole (PRILOSEC) 10 MG capsule Take 10 mg by mouth daily.   Yes [provider]    Physical Exam: Vitals:   04/22/17 2101 04/22/17 2333 04/23/17 0015 04/23/17 0030  BP:  (!) 174/69 (!) 165/65 (!) 158/66  Pulse:  81 80 80  Resp:  20 11 20   Temp:      TempSrc:      SpO2:  98% 100% 97%  Weight: 77.1 kg  (170 lb)     Height: 5\' 10"  (1.778 m)       Constitutional: NAD, calm, comfortable Eyes: PERRL, lids and conjunctivae normal ENMT: Mucous membranes are moist. Posterior pharynx clear of any exudate or lesions.Normal dentition. Hard of hearing. Neck: normal, supple, no masses, no thyromegaly Respiratory: clear to auscultation bilaterally, no wheezing, no crackles. Normal respiratory effort. No accessory muscle use.  Cardiovascular: Regular rate and rhythm, no murmurs / rubs / gallops. No extremity edema. 2+ pedal pulses. No carotid bruits.  Abdomen: no tenderness, no masses palpated. No hepatosplenomegaly. Bowel sounds positive.  Musculoskeletal: no clubbing / cyanosis. No joint deformity upper and lower extremities. Good ROM, no contractures. Normal muscle tone.  Skin: no rashes, lesions, ulcers. No induration Neurologic: CN 2-12 grossly intact. Sensation intact, DTR normal. Strength 5/5 in all 4.  Psychiatric: Normal judgment and insight. Alert and oriented x 3. Normal mood.    Labs on Admission: I have personally reviewed following labs and imaging studies  CBC: No results for input(s): WBC, NEUTROABS, HGB, HCT, MCV, PLT in the last 168 hours. Basic Metabolic Panel: No results for input(s): NA, K, CL, CO2, GLUCOSE, BUN, CREATININE, CALCIUM, MG, PHOS in the last 168 hours. GFR: CrCl cannot be calculated (Patient's most recent lab result is older than the maximum 21 days allowed.). Liver Function Tests: No results for input(s): AST, ALT, ALKPHOS, BILITOT, PROT, ALBUMIN in the last 168 hours. No results for input(s): LIPASE, AMYLASE in the last 168 hours. No results for input(s): AMMONIA in the last 168 hours. Coagulation Profile: No results for input(s): INR, PROTIME in the last 168 hours. Cardiac Enzymes: No results for input(s): CKTOTAL, CKMB, CKMBINDEX, TROPONINI in the last 168 hours. BNP (last 3 results) No results for input(s): PROBNP in the last 8760 hours. HbA1C: No  results for input(s): HGBA1C in the last 72 hours. CBG: No results for input(s): GLUCAP in the last 168 hours. Lipid Profile: No results for input(s): CHOL, HDL, LDLCALC, TRIG, CHOLHDL, LDLDIRECT in the last 72 hours. Thyroid Function Tests: No results for input(s): TSH, T4TOTAL, FREET4, T3FREE, THYROIDAB in the last 72 hours. Anemia Panel: No results for input(s): VITAMINB12, FOLATE, FERRITIN, TIBC, IRON, RETICCTPCT in the last 72 hours. Urine analysis: No results found for: COLORURINE, APPEARANCEUR, LABSPEC, PHURINE, GLUCOSEU, HGBUR, BILIRUBINUR, KETONESUR, PROTEINUR, UROBILINOGEN, NITRITE, LEUKOCYTESUR  Radiological Exams on Admission: Ct Head Wo Contrast  Result Date: 04/22/2017 CLINICAL DATA:  Fall at nursing facility striking back of head. Positive loss of consciousness. Neck pain. Initial exam. EXAM: CT HEAD WITHOUT CONTRAST CT CERVICAL SPINE WITHOUT CONTRAST TECHNIQUE: Multidetector CT imaging of the head and cervical spine was performed following the standard protocol without intravenous contrast. Multiplanar CT image reconstructions of the cervical spine were also generated. COMPARISON:  Brain MRI 07/31/2012  FINDINGS: CT HEAD FINDINGS Brain: Multifocal intracranial hemorrhage. Intraparenchymal hemorrhage in the left frontal lobe measures 1.6 x 1.4 x 1.6 mm (volume = 1.9 mm^3). Multifocal small volumesubarachnoid hemorrhage in the left frontal and temporal lobes. Small subdural hemorrhage tracks along the superior falx, left frontal adjacent intraparenchymal hemorrhage and bilateral tentorium. 5 mm extra-axial hemorrhage in the left temporal region favored to be subdural rather than epidural. There is minimal 3 mm left-to-right midline shift. Diffuse cerebral atrophy. Moderate chronic small vessel ischemia. Vascular: Atherosclerosis of skullbase vasculature without hyperdense vessel or abnormal calcification. Skull: No skull fracture. Sinuses/Orbits: Paranasal sinuses the mastoid air cells are  clear. Bilateral cataract resection. Other: None. CT CERVICAL SPINE FINDINGS Alignment: Normal. Skull base and vertebrae: No acute fracture. Vertebral body heights are maintained. The dens and skull base are intact. Soft tissues and spinal canal: No prevertebral fluid or swelling. No visible canal hematoma. Disc levels: Diffuse disc space narrowing and endplate spurring. Multilevel facet arthropathy. Upper chest: No acute finding. Other: Carotid calcifications. IMPRESSION: 1. Intracranial hemorrhage. Left frontal intraparenchymal hematoma volume of 1.9 cc. Scattered left subarachnoid hemorrhage in the frontal and temporal lobes. Subdural hematoma tracking along the left frontal region, bilateral tentorium and superior falx. 5 mm thickness extra-axial hemorrhage left temporal region likely also subdural, less likely epidural. There is 3 mm left-to-right midline shift. 2. No skull fracture. 3. Multilevel degenerative change in the cervical spine without acute fracture. Critical Value/emergent results were discussed by telephone at the time of interpretation on 04/22/2017 at 11:41 pm to Dr. Lynden Oxford , who verbally acknowledged these results. Electronically Signed   By: Rubye Oaks M.D.   On: 04/22/2017 23:42   Ct Cervical Spine Wo Contrast  Result Date: 04/22/2017 CLINICAL DATA:  Fall at nursing facility striking back of head. Positive loss of consciousness. Neck pain. Initial exam. EXAM: CT HEAD WITHOUT CONTRAST CT CERVICAL SPINE WITHOUT CONTRAST TECHNIQUE: Multidetector CT imaging of the head and cervical spine was performed following the standard protocol without intravenous contrast. Multiplanar CT image reconstructions of the cervical spine were also generated. COMPARISON:  Brain MRI 07/31/2012 FINDINGS: CT HEAD FINDINGS Brain: Multifocal intracranial hemorrhage. Intraparenchymal hemorrhage in the left frontal lobe measures 1.6 x 1.4 x 1.6 mm (volume = 1.9 mm^3). Multifocal small  volumesubarachnoid hemorrhage in the left frontal and temporal lobes. Small subdural hemorrhage tracks along the superior falx, left frontal adjacent intraparenchymal hemorrhage and bilateral tentorium. 5 mm extra-axial hemorrhage in the left temporal region favored to be subdural rather than epidural. There is minimal 3 mm left-to-right midline shift. Diffuse cerebral atrophy. Moderate chronic small vessel ischemia. Vascular: Atherosclerosis of skullbase vasculature without hyperdense vessel or abnormal calcification. Skull: No skull fracture. Sinuses/Orbits: Paranasal sinuses the mastoid air cells are clear. Bilateral cataract resection. Other: None. CT CERVICAL SPINE FINDINGS Alignment: Normal. Skull base and vertebrae: No acute fracture. Vertebral body heights are maintained. The dens and skull base are intact. Soft tissues and spinal canal: No prevertebral fluid or swelling. No visible canal hematoma. Disc levels: Diffuse disc space narrowing and endplate spurring. Multilevel facet arthropathy. Upper chest: No acute finding. Other: Carotid calcifications. IMPRESSION: 1. Intracranial hemorrhage. Left frontal intraparenchymal hematoma volume of 1.9 cc. Scattered left subarachnoid hemorrhage in the frontal and temporal lobes. Subdural hematoma tracking along the left frontal region, bilateral tentorium and superior falx. 5 mm thickness extra-axial hemorrhage left temporal region likely also subdural, less likely epidural. There is 3 mm left-to-right midline shift. 2. No skull fracture. 3. Multilevel degenerative change in  the cervical spine without acute fracture. Critical Value/emergent results were discussed by telephone at the time of interpretation on 04/22/2017 at 11:41 pm to Dr. Lynden Oxford , who verbally acknowledged these results. Electronically Signed   By: Rubye Oaks M.D.   On: 04/22/2017 23:42    EKG: Independently reviewed.  Assessment/Plan Active Problems:   Traumatic intracerebral  hemorrhage (HCC)    1. Traumatic ICH - 1. Dr Dutch Quint called by EDP 1. Recommended Q4H neurochecks 2. Repeat CT head in 24 hours 2. Tele monitor 3. Holding ASA 81 (only blood thinner he is on) 4. Family doesn't know where the PMH of "free bleeder" came from, he has no h/o bleeding disorder according to family members 5. INR and platelets pending 6. Tylenol and small dose of morphine if needed for pain.  DVT prophylaxis: SCDs Code Status: Full code per family Family Communication: Family at bedside Disposition Plan: Home after admit Consults called: Dr. Dutch Quint Admission status: Admit to inpatient - patient will be spending 2 midnights in the hospital with repeat CT ordered for tomorrow evening   Hillary Bow DO [facility] Pager [phone]  If 7AM-7PM, please contact day team taking care of patient [email or web address] Password [facility]  04/23/2017, 12:39 AM

## 2017-04-23 NOTE — Consult Note (Signed)
Reason for Consult: Traumatic brain injury with cerebral contusion Referring Physician: Medicine  [name] is an 84 y.o. male.  HPI: 84 year old male status post mechanical fall striking the back of his head.  No loss of consciousness.  Patient does note some mild amnesia around the events immediately after the fall.  He denies any numbness, paresthesias or weakness.  He has some mild headache.  He has no symptoms of cranial nerve dysfunction.  Overall he states he feels fine this morning.  Past Medical History:  Diagnosis Date  . Anemia   . Blood dyscrasia    ' free bleeder' per patient   . Coronary artery disease    3 coronary stents- followed by Dr Loleta Chance   . Diabetes mellitus without complication ([place])    type II   . Essential hypertension 10/22/2014  . GERD (gastroesophageal reflux disease)   . Multiple abrasions    on arms due to stumbling over chair on 11/19/16 per patient     Past Surgical History:  Procedure Laterality Date  . CARDIAC CATHETERIZATION N/A 10/25/2014   Procedure: Left Heart Cath and Coronary Angiography;  Surgeon: Burnell Blanks, MD;  Location: [facility];  Service: Cardiovascular;  Laterality: N/A;  . CARDIAC CATHETERIZATION N/A 10/26/2014   Procedure: Coronary Stent Intervention;  Surgeon: Burnell Blanks, MD;  Location: [facility];  Service: Cardiovascular;  Laterality: N/A;  . ESOPHAGOGASTRODUODENOSCOPY (EGD) WITH PROPOFOL N/A 11/25/2016   Procedure: ESOPHAGOGASTRODUODENOSCOPY (EGD) WITH PROPOFOL;  Surgeon: Laurence Spates, MD;  Location: [facility];  Service: Endoscopy;  Laterality: N/A;  . HELLER MYOTOMY  12/17/2011   Procedure: LAPAROSCOPIC HELLER MYOTOMY;  Surgeon: Pedro Earls, MD;  Location: [facility];  Service: General;  Laterality: N/A;  . [place]  . JOINT REPLACEMENT  2007   rt hip   . SAVORY DILATION N/A 11/25/2016   Procedure: SAVORY DILATION;  Surgeon: Laurence Spates, MD;  Location: [facility];   Service: Endoscopy;  Laterality: N/A;  . UPPER GI ENDOSCOPY  12/17/2011   Procedure: UPPER GI ENDOSCOPY;  Surgeon: Pedro Earls, MD;  Location: [facility];  Service: General;  Laterality: N/A;    Family History  Problem Relation Age of Onset  . Heart disease Mother   . Heart disease Father     Social History:  reports that he has quit smoking. he has never used smokeless tobacco. He reports that he does not drink alcohol or use drugs.  Allergies: No Known Allergies  Medications: I have reviewed the patient's current medications.  Results for orders placed or performed during the hospital encounter of 04/22/17 (from the past 48 hour(s))  CBC with Differential     Status: Abnormal   Collection Time: 04/23/17 12:42 AM  Result Value Ref Range   WBC 11.7 (H) 4.0 - 10.5 K/uL   RBC 3.61 (L) 4.22 - 5.81 MIL/uL   Hemoglobin 10.6 (L) 13.0 - 17.0 g/dL   HCT 32.3 (L) 39.0 - 52.0 %   MCV 89.5 78.0 - 100.0 fL   MCH 29.4 26.0 - 34.0 pg   MCHC 32.8 30.0 - 36.0 g/dL   RDW 14.0 11.5 - 15.5 %   Platelets 186 150 - 400 K/uL   Neutrophils Relative % 84 %   Neutro Abs 9.7 (H) 1.7 - 7.7 K/uL   Lymphocytes Relative 12 %   Lymphs Abs 1.4 0.7 - 4.0 K/uL   Monocytes Relative 4 %   Monocytes Absolute 0.5 0.1 -  1.0 K/uL   Eosinophils Relative 0 %   Eosinophils Absolute 0.1 0.0 - 0.7 K/uL   Basophils Relative 0 %   Basophils Absolute 0.0 0.0 - 0.1 K/uL    Comment: Performed at [facility] [address] [place], [place] [address]  Basic metabolic panel     Status: Abnormal   Collection Time: 04/23/17 12:42 AM  Result Value Ref Range   Sodium 138 135 - 145 mmol/L   Potassium 4.0 3.5 - 5.1 mmol/L   Chloride 107 101 - 111 mmol/L   CO2 20 (L) 22 - 32 mmol/L   Glucose, Bld 173 (H) 65 - 99 mg/dL   BUN 34 (H) 6 - 20 mg/dL   Creatinine, Ser 2.07 (H) 0.61 - 1.24 mg/dL   Calcium 9.0 8.9 - 10.3 mg/dL   GFR calc non Af Amer 26 (L) >60 mL/min   GFR calc Af Amer 30 (L) >60 mL/min    Comment:  (NOTE) The eGFR has been calculated using the CKD EPI equation. This calculation has not been validated in all clinical situations. eGFR's persistently <60 mL/min signify possible Chronic Kidney Disease.    Anion gap 11 5 - 15    Comment: Performed at [facility] [address], [place], [place] [address]  Protime-INR     Status: None   Collection Time: 04/23/17 12:42 AM  Result Value Ref Range   Prothrombin Time 14.1 11.4 - 15.2 seconds   INR 1.10     Comment: Performed at [facility] [address], [place], [place] [address]  Glucose, capillary     Status: Abnormal   Collection Time: 04/23/17  6:14 AM  Result Value Ref Range   Glucose-Capillary 131 (H) 65 - 99 mg/dL   Comment 1 Notify RN    Comment 2 Document in Chart     Ct Head Wo Contrast  Result Date: 04/22/2017 CLINICAL DATA:  Fall at nursing facility striking back of head. Positive loss of consciousness. Neck pain. Initial exam. EXAM: CT HEAD WITHOUT CONTRAST CT CERVICAL SPINE WITHOUT CONTRAST TECHNIQUE: Multidetector CT imaging of the head and cervical spine was performed following the standard protocol without intravenous contrast. Multiplanar CT image reconstructions of the cervical spine were also generated. COMPARISON:  Brain MRI 07/31/2012 FINDINGS: CT HEAD FINDINGS Brain: Multifocal intracranial hemorrhage. Intraparenchymal hemorrhage in the left frontal lobe measures 1.6 x 1.4 x 1.6 mm (volume = 1.9 mm^3). Multifocal small volumesubarachnoid hemorrhage in the left frontal and temporal lobes. Small subdural hemorrhage tracks along the superior falx, left frontal adjacent intraparenchymal hemorrhage and bilateral tentorium. 5 mm extra-axial hemorrhage in the left temporal region favored to be subdural rather than epidural. There is minimal 3 mm left-to-right midline shift. Diffuse cerebral atrophy. Moderate chronic small vessel ischemia. Vascular: Atherosclerosis of skullbase vasculature without hyperdense vessel  or abnormal calcification. Skull: No skull fracture. Sinuses/Orbits: Paranasal sinuses the mastoid air cells are clear. Bilateral cataract resection. Other: None. CT CERVICAL SPINE FINDINGS Alignment: Normal. Skull base and vertebrae: No acute fracture. Vertebral body heights are maintained. The dens and skull base are intact. Soft tissues and spinal canal: No prevertebral fluid or swelling. No visible canal hematoma. Disc levels: Diffuse disc space narrowing and endplate spurring. Multilevel facet arthropathy. Upper chest: No acute finding. Other: Carotid calcifications. IMPRESSION: 1. Intracranial hemorrhage. Left frontal intraparenchymal hematoma volume of 1.9 cc. Scattered left subarachnoid hemorrhage in the frontal and temporal lobes. Subdural hematoma tracking along the left frontal region, bilateral  tentorium and superior falx. 5 mm thickness extra-axial hemorrhage left temporal region likely also subdural, less likely epidural. There is 3 mm left-to-right midline shift. 2. No skull fracture. 3. Multilevel degenerative change in the cervical spine without acute fracture. Critical Value/emergent results were discussed by telephone at the time of interpretation on 04/22/2017 at 11:41 pm to Dr. Marda Stalker , who verbally acknowledged these results. Electronically Signed   By: Jeb Levering M.D.   On: 04/22/2017 23:42   Ct Cervical Spine Wo Contrast  Result Date: 04/22/2017 CLINICAL DATA:  Fall at nursing facility striking back of head. Positive loss of consciousness. Neck pain. Initial exam. EXAM: CT HEAD WITHOUT CONTRAST CT CERVICAL SPINE WITHOUT CONTRAST TECHNIQUE: Multidetector CT imaging of the head and cervical spine was performed following the standard protocol without intravenous contrast. Multiplanar CT image reconstructions of the cervical spine were also generated. COMPARISON:  Brain MRI 07/31/2012 FINDINGS: CT HEAD FINDINGS Brain: Multifocal intracranial hemorrhage. Intraparenchymal  hemorrhage in the left frontal lobe measures 1.6 x 1.4 x 1.6 mm (volume = 1.9 mm^3). Multifocal small volumesubarachnoid hemorrhage in the left frontal and temporal lobes. Small subdural hemorrhage tracks along the superior falx, left frontal adjacent intraparenchymal hemorrhage and bilateral tentorium. 5 mm extra-axial hemorrhage in the left temporal region favored to be subdural rather than epidural. There is minimal 3 mm left-to-right midline shift. Diffuse cerebral atrophy. Moderate chronic small vessel ischemia. Vascular: Atherosclerosis of skullbase vasculature without hyperdense vessel or abnormal calcification. Skull: No skull fracture. Sinuses/Orbits: Paranasal sinuses the mastoid air cells are clear. Bilateral cataract resection. Other: None. CT CERVICAL SPINE FINDINGS Alignment: Normal. Skull base and vertebrae: No acute fracture. Vertebral body heights are maintained. The dens and skull base are intact. Soft tissues and spinal canal: No prevertebral fluid or swelling. No visible canal hematoma. Disc levels: Diffuse disc space narrowing and endplate spurring. Multilevel facet arthropathy. Upper chest: No acute finding. Other: Carotid calcifications. IMPRESSION: 1. Intracranial hemorrhage. Left frontal intraparenchymal hematoma volume of 1.9 cc. Scattered left subarachnoid hemorrhage in the frontal and temporal lobes. Subdural hematoma tracking along the left frontal region, bilateral tentorium and superior falx. 5 mm thickness extra-axial hemorrhage left temporal region likely also subdural, less likely epidural. There is 3 mm left-to-right midline shift. 2. No skull fracture. 3. Multilevel degenerative change in the cervical spine without acute fracture. Critical Value/emergent results were discussed by telephone at the time of interpretation on 04/22/2017 at 11:41 pm to Dr. Marda Stalker , who verbally acknowledged these results. Electronically Signed   By: Jeb Levering M.D.   On: 04/22/2017  23:42    Pertinent items noted in HPI and remainder of comprehensive ROS otherwise negative. Blood pressure (!) 148/61, pulse 69, temperature 97.6 F (36.4 C), temperature source Oral, resp. rate 18, height _0  (1.778 m), weight 77.1 kg (170 lb), SpO2 97 %. Patient is awake and alert.  He is oriented and appropriate.  His speech is fluent.  His judgment insight are intact.  Cranial nerve function normal bilaterally.  Motor examination 5/5 bilateral upper and lower extremities.  No pronator drift.  Sensory examination nonfocal.  Reflexes normal active.  No evidence of long track signs.  Gait not tested.  Examination of head ears eyes nose throat demonstrates some surface ecchymosis along the posterior scalp.  No evidence of laceration or significant contusion.  No bony abnormality.  Oropharynx nasopharynx and external auditory canals clear.  Neck supple.  Nontender.  Airway midline.  Examination of the extremities finds no evidence  of injury or deformity.  Chest and abdomen appear benign.    Assessment/Plan: Left frontal contrecoup contusion.  Patient overall looks good with minimal if any symptoms.  May be mobilized today with therapy to assess for gait stability.  Should have a follow-up head CT scan sometime later today.  If this scan is stable patient may be safely discharged home.  He should refrain from aspirin use or other antiplatelet agents.  Tamika Shropshire A Skyler Dusing 04/23/2017, 8:08 AM

## 2017-04-23 NOTE — Progress Notes (Signed)
P tarrived at the unit at about 0135. He is alert and oriented X4. His vital signs are within normal limits.

## 2017-04-23 NOTE — Discharge Instructions (Addendum)
Intracranial Hemorrhage An intracranial hemorrhage is bleeding in the layers between the skull (cranium) and brain. A blood vessel bursts and allows blood to leak inside the cranial cavity. The leaking blood then collects (hematoma). This causes pressure and damage to brain cells. The bleeding can be mild to severe. In severe cases, it can lead to permanent damage or death. Symptoms may come on suddenly or develop over time. Early diagnosis and treatment leads to better recovery. There are four types of intracranial hemorrhage: subarachnoid, subdural, extradural, or cerebral hemorrhage. What are the causes?  Head injury (trauma).  Ruptured brain aneurysm.  Bleeding from blood vessels that develop abnormally (arteriovenous malformation).  Bleeding disorder.  Use of blood thinners (anticoagulants).  Use of certain drugs, such as cocaine. For some people with intracranial hemorrhage, the cause is unknown. What increases the risk?  Using tobacco products, such as cigarettes and chewing tobacco.  Having high blood pressure (hypertension).  Abusing alcohol.  Being a male, especially of postmenopausal age.  Having a family history of disease in the blood vessels of the brain (cerebrovascular disease).  Having certain genetic syndromes that result in kidney disease or connective tissue disease. What are the signs or symptoms?  A sudden, severe headache with no known cause. The headache is often described as the worst headache ever experienced.  Nausea or vomiting, especially when combined with other symptoms such as a headache.  Sudden weakness or numbness of the face, arm, or leg, especially on one side of the body.  Sudden trouble walking or difficulty moving arms or legs.  Sudden confusion.  Sudden personality changes.  Trouble speaking (aphasia) or understanding.  Difficulty swallowing.  Sudden trouble seeing in one or both eyes.  Double vision.  Dizziness.  Loss  of balance or coordination.  Intolerance to light.  Stiff neck. How is this diagnosed? Your health care provider will perform a physical exam and ask about your symptoms. If an intracranial hemorrhage is suspected, various tests may be ordered. These tests may include:  A CT scan.  An MRI.  A cerebral angiogram.  A spinal tap (lumbar puncture).  Blood tests.  How is this treated? Immediate treatment in the hospital is often required to reduce the risk of brain damage. Treatment will depend on the cause of the bleeding, where it is located, and the extent of the bleeding and damage. The goals of treatment include stopping the bleeding, repairing the cause of bleeding, providing relief of symptoms, and preventing problems.  Medicines may be given to: ? Lower blood pressure (antihypertensives). ? Relieve pain (analgesics). ? Relieve nausea or vomiting.  Surgery may be needed to stop the bleeding, repair the cause of the bleeding, or remove the blood.  Rehabilitation may be needed to improve any cognitive and day-to-day functions impaired by the condition.  Further treatment depends on the duration, severity, and cause of your symptoms. Physical, speech, and occupational therapists will assess you and work to improve any functions impaired by the intracranial hemorrhage. Measures will be taken to prevent short-term and long-term problems, including infection from breathing foreign material into the lungs (aspiration pneumonia), blood clots in the legs, bedsores, and falls. Follow these instructions at home:  Take medicines only as directed by your health care provider.  Eat healthy foods as directed by your health care provider: ? A diet low in salt (sodium), saturated fat, trans fat, and cholesterol may be recommended to manage your blood pressure. ? Foods may need to be soft or pureed,  or small bites may need to be taken in order to avoid aspirating or choking. ? If studies show  that your ability to swallow safely has been affected, you may need to seek help from specialists such as a dietitian, speech and language pathologist, or an occupational therapist. These health care providers can teach you how to safely get the nutrition your body needs.  Rest and limit activities or movements as directed by your health care provider.  Do not use any tobacco products including cigarettes, chewing tobacco, or electronic cigarettes. If you need help quitting, ask your health care provider.  Limit alcohol intake to no more than 1 drink per day for nonpregnant women and 2 drinks per day for men. One drink equals 12 ounces of beer, 5 ounces of wine, or 1 ounces of hard liquor.  Make any other lifestyle changes as directed by your health care provider.  Monitor and record your blood pressure as directed by your health care provider.  A safe home environment is important to reduce the risk of falls. Your health care provider may arrange for specialists to evaluate your home. Having grab bars in the bedroom and bathroom is often important. Your health care provider may arrange for special equipment to be used at home, such as raised toilets and a seat for the shower.  Do physical, occupational, and speech therapy as directed by your health care provider. Ongoing therapy may be needed to maximize your recovery.  Use a walker or a cane at all times if directed by your health care provider.  Keep all follow-up visits with your health care provider and other specialists. This is important. This includes any referrals, physical therapy, and rehabilitation. Get help right away if:  You have a sudden, severe headache with no known cause.  You have nausea or vomiting occurring with another symptom.  You have sudden weakness or numbness of the face, arm, or leg, especially on one side of the body.  You have sudden trouble walking or difficulty moving your arms or legs.  You have sudden  confusion.  You have trouble speaking (aphasia) or understanding.  You have sudden trouble seeing in one or both eyes.  You have a sudden loss of balance or coordination.  You have a stiff neck.  You have difficulty breathing.  You have a partial or total loss of consciousness. These symptoms may represent a serious problem that is an emergency. Do not wait to see if the symptoms will go away. Get medical help right away. Call your local emergency services (911 in the [place] Do not drive yourself to the hospital. This information is not intended to replace advice given to you by your health care provider. Make sure you discuss any questions you have with your health care provider. Document Released: 09/01/2013 Document Revised: 07/07/2015 Document Reviewed: 03/31/2013 Elsevier Interactive Patient Education  [employer]. Please take all your medications with you for your next visit with your Primary MD. Please request your Primary MD to go over all hospital test results at the follow up. Please ask your Primary MD to get all Hospital records sent to his/her office.  If you experience worsening of your admission symptoms, develop shortness of breath, chest pain, suicidal or homicidal thoughts or a life threatening emergency, you must seek medical attention immediately by calling 911 or calling your MD.  [name] must read the complete instructions/literature along with all the possible adverse reactions/side effects for all the medicines you take  including new medications that have been prescribed to you. Take new medicines after you have completely understood and accpet all the possible adverse reactions/side effects.   Do not drive when taking pain medications or sedatives.    Do not take more than prescribed Pain, Sleep and Anxiety Medications  If you have smoked or chewed Tobacco in the last 2 yrs please stop. Stop any regular alcohol and or recreational drug use.  Wear Seat  belts while driving.

## 2017-04-23 NOTE — Discharge Summary (Signed)
Physician Discharge Summary  [name] ZOX:[id number] DOB: 1922/06/08 DOA: 04/22/2017  PCP: Merlene LaughterStoneking, Hal, MD  Admit date: 04/22/2017 Discharge date: 04/23/2017  Admitted From: independent living Disposition:  Same as above   Recommendations for Outpatient Follow-up:  1. NO Aspirin for a minimal of 3 wks  Home Health:  ordered  Discharge Condition:  stable   CODE STATUS:  Full code   Consultations:  neurosurgery    Discharge Diagnoses:  Principal Problem:   Traumatic intracerebral hemorrhage (HCC) Active Problems:   Essential hypertension   Chronic renal disease, stage III (HCC)   GERD (gastroesophageal reflux disease)   Type 2 diabetes with stage 3 chronic kidney disease GFR 30-59 (HCC)   CAD -S/P LAD and CFX DES 10/26/14   Dyslipidemia    Subjective: Mild discomfort in back of head. No other complaints.   Brief Summary: [name] is a 84 y.o. male with medical history significant of DM2, HTN.  Patient presents to the ED after a mechanical fall at his independent living facility.  CT of the head shows:  1. Intracranial hemorrhage. Left frontal intraparenchymal hematoma volume of 1.9 cc. Scattered left subarachnoid hemorrhage in the frontal and temporal lobes. Subdural hematoma tracking along the left frontal region, bilateral tentorium and superior falx. 5 mm thickness extra-axial hemorrhage left temporal region likely also subdural, less likely epidural. There is 3 mm left-to-right midline Shift.  Neurosurgery recommended hospitalist to admit for overnight monitoring.    Hospital Course:  Fall - orthostatic vitals negative- likely just a mechanical fall- PT recommends home health PT   Intracranial hemorrhage - no neurological deficits noted- Dr Jordan LikesPool, Neurosurgery, evaluated him today and felt that the patient could be discharged as long as repeat CT was not abnormal - CT head repeated today. Findings as follows: Mild interval increase in size of an  intraparenchymal hemorrhage at the anterior LEFT frontal lobe. Scattered foci of subarachnoid hemorrhage at LEFT hemisphere in LEFT sylvian fissure. Small RIGHT tentorial and question small LEFT frontal subdural hematomas.  - I called Dr Dutch QuintPoole at the office and have asked him to review the images- he feels that this CT represents sequelae of his initial injury and does not represent further bleeding- he recommends discharge home and dose not feel he needs further imaging- he does states to avoid Aspirin for 3 wks - [facility]    Discharge Exam:  04/23/17 1334  BP: (!) 155/58  Pulse: 67  Resp: 17  Temp: 97.6 F (36.4 C)  SpO2: 99%   Vitals:   04/23/17 0428 04/23/17 0822 04/23/17 1334 04/23/17 1540  BP: (!) 148/61 (!) 116/48 (!) 155/58 (!) 171/60  Pulse: 69 72 67 75  Resp: 18 17 17 17   Temp: 97.6 F (36.4 C) 98 F (36.7 C) 97.6 F (36.4 C) 97.9 F (36.6 C)  TempSrc: Oral Oral Oral Oral  SpO2: 97% 98% 99% 98%  Weight:      Height:        General: Pt is alert, awake, not in acute distress Cardiovascular: RRR, S1/S2 +, no rubs, no gallops Respiratory: CTA bilaterally, no wheezing, no rhonchi Abdominal: Soft, NT, ND, bowel sounds + Extremities: no edema, no cyanosis   Discharge Instructions  Discharge Instructions    Diet - low sodium heart healthy   Complete by:  As directed    Increase activity slowly   Complete by:  As directed      Allergies as of 04/23/2017  No Known Allergies     Medication List    STOP taking these medications   aspirin 81 MG tablet     TAKE these medications   amLODipine 5 MG tablet Commonly known as:  NORVASC Take 5 mg by mouth daily.   atorvastatin 10 MG tablet Commonly known as:  LIPITOR Take 10 mg by mouth every evening.   ferrous sulfate 325 (65 FE) MG tablet Take 325 mg by mouth every Monday, Wednesday, and Friday.   metFORMIN 500 MG 24 hr tablet Commonly known as:  GLUCOPHAGE-XR Take 1  tablet (500 mg total) by mouth every morning. What changed:  when to take this   metoprolol tartrate 25 MG tablet Commonly known as:  LOPRESSOR Take 1 tablet (25 mg total) by mouth 2 (two) times daily. NEED OV.   omeprazole 10 MG capsule Commonly known as:  PRILOSEC Take 10 mg by mouth daily.       No Known Allergies   Procedures/Studies:    Ct Head Wo Contrast  Result Date: 04/23/2017 CLINICAL DATA:  Larey Seat last night at home sustaining LEFT intraparenchymal hemorrhage, subdural hemorrhage and subarachnoid hemorrhage, follow-up EXAM: CT HEAD WITHOUT CONTRAST TECHNIQUE: Contiguous axial images were obtained from the base of the skull through the vertex without intravenous contrast. Sagittal and coronal MPR images reconstructed from axial data set. COMPARISON:  04/22/2017 FINDINGS: Brain: Generalized atrophy. Normal ventricular morphology. 2 mm of LEFT-to-RIGHT midline shift. Focus of intraparenchymal hemorrhage identified within the anteromedial LEFT frontal lobe 2.7 x 1.6 cm previously 1.6 x 1.4 cm. Scattered foci of subarachnoid blood over LEFT hemisphere and within LEFT sylvian fissure. Small vessel chronic ischemic changes of deep cerebral white matter. Small subdural hematoma along the RIGHT tentorium and questionably in the LEFT frontal region. Vascular: Atherosclerotic calcifications of internal carotid arteries bilaterally at skull base Skull: Demineralized but intact Sinuses/Orbits: Clear Other: N/A IMPRESSION: Mild interval increase in size of an intraparenchymal hemorrhage at the anterior LEFT frontal lobe. Scattered foci of subarachnoid hemorrhage at LEFT hemisphere in LEFT sylvian fissure. Small RIGHT tentorial and question small LEFT frontal subdural hematomas. Atrophy with small vessel chronic ischemic changes of deep cerebral white matter. No new intracranial abnormalities. Electronically Signed   By: Ulyses Southward M.D.   On: 04/23/2017 14:21   Ct Head Wo Contrast  Result Date:  04/22/2017 CLINICAL DATA:  Fall at nursing facility striking back of head. Positive loss of consciousness. Neck pain. Initial exam. EXAM: CT HEAD WITHOUT CONTRAST CT CERVICAL SPINE WITHOUT CONTRAST TECHNIQUE: Multidetector CT imaging of the head and cervical spine was performed following the standard protocol without intravenous contrast. Multiplanar CT image reconstructions of the cervical spine were also generated. COMPARISON:  Brain MRI 07/31/2012 FINDINGS: CT HEAD FINDINGS Brain: Multifocal intracranial hemorrhage. Intraparenchymal hemorrhage in the left frontal lobe measures 1.6 x 1.4 x 1.6 mm (volume = 1.9 mm^3). Multifocal small volumesubarachnoid hemorrhage in the left frontal and temporal lobes. Small subdural hemorrhage tracks along the superior falx, left frontal adjacent intraparenchymal hemorrhage and bilateral tentorium. 5 mm extra-axial hemorrhage in the left temporal region favored to be subdural rather than epidural. There is minimal 3 mm left-to-right midline shift. Diffuse cerebral atrophy. Moderate chronic small vessel ischemia. Vascular: Atherosclerosis of skullbase vasculature without hyperdense vessel or abnormal calcification. Skull: No skull fracture. Sinuses/Orbits: Paranasal sinuses the mastoid air cells are clear. Bilateral cataract resection. Other: None. CT CERVICAL SPINE FINDINGS Alignment: Normal. Skull base and vertebrae: No acute fracture. Vertebral body heights are maintained. The dens  and skull base are intact. Soft tissues and spinal canal: No prevertebral fluid or swelling. No visible canal hematoma. Disc levels: Diffuse disc space narrowing and endplate spurring. Multilevel facet arthropathy. Upper chest: No acute finding. Other: Carotid calcifications. IMPRESSION: 1. Intracranial hemorrhage. Left frontal intraparenchymal hematoma volume of 1.9 cc. Scattered left subarachnoid hemorrhage in the frontal and temporal lobes. Subdural hematoma tracking along the left frontal region,  bilateral tentorium and superior falx. 5 mm thickness extra-axial hemorrhage left temporal region likely also subdural, less likely epidural. There is 3 mm left-to-right midline shift. 2. No skull fracture. 3. Multilevel degenerative change in the cervical spine without acute fracture. Critical Value/emergent results were discussed by telephone at the time of interpretation on 04/22/2017 at 11:41 pm to Dr. Lynden Oxford , who verbally acknowledged these results. Electronically Signed   By: Rubye Oaks M.D.   On: 04/22/2017 23:42   Ct Cervical Spine Wo Contrast  Result Date: 04/22/2017 CLINICAL DATA:  Fall at nursing facility striking back of head. Positive loss of consciousness. Neck pain. Initial exam. EXAM: CT HEAD WITHOUT CONTRAST CT CERVICAL SPINE WITHOUT CONTRAST TECHNIQUE: Multidetector CT imaging of the head and cervical spine was performed following the standard protocol without intravenous contrast. Multiplanar CT image reconstructions of the cervical spine were also generated. COMPARISON:  Brain MRI 07/31/2012 FINDINGS: CT HEAD FINDINGS Brain: Multifocal intracranial hemorrhage. Intraparenchymal hemorrhage in the left frontal lobe measures 1.6 x 1.4 x 1.6 mm (volume = 1.9 mm^3). Multifocal small volumesubarachnoid hemorrhage in the left frontal and temporal lobes. Small subdural hemorrhage tracks along the superior falx, left frontal adjacent intraparenchymal hemorrhage and bilateral tentorium. 5 mm extra-axial hemorrhage in the left temporal region favored to be subdural rather than epidural. There is minimal 3 mm left-to-right midline shift. Diffuse cerebral atrophy. Moderate chronic small vessel ischemia. Vascular: Atherosclerosis of skullbase vasculature without hyperdense vessel or abnormal calcification. Skull: No skull fracture. Sinuses/Orbits: Paranasal sinuses the mastoid air cells are clear. Bilateral cataract resection. Other: None. CT CERVICAL SPINE FINDINGS Alignment: Normal. Skull  base and vertebrae: No acute fracture. Vertebral body heights are maintained. The dens and skull base are intact. Soft tissues and spinal canal: No prevertebral fluid or swelling. No visible canal hematoma. Disc levels: Diffuse disc space narrowing and endplate spurring. Multilevel facet arthropathy. Upper chest: No acute finding. Other: Carotid calcifications. IMPRESSION: 1. Intracranial hemorrhage. Left frontal intraparenchymal hematoma volume of 1.9 cc. Scattered left subarachnoid hemorrhage in the frontal and temporal lobes. Subdural hematoma tracking along the left frontal region, bilateral tentorium and superior falx. 5 mm thickness extra-axial hemorrhage left temporal region likely also subdural, less likely epidural. There is 3 mm left-to-right midline shift. 2. No skull fracture. 3. Multilevel degenerative change in the cervical spine without acute fracture. Critical Value/emergent results were discussed by telephone at the time of interpretation on 04/22/2017 at 11:41 pm to Dr. Lynden Oxford , who verbally acknowledged these results. Electronically Signed   By: Rubye Oaks M.D.   On: 04/22/2017 23:42     The results of significant diagnostics from this hospitalization (including imaging, microbiology, ancillary and laboratory) are listed below for reference.     Microbiology: No results found for this or any previous visit (from the past 240 hour(s)).   Labs: BNP (last 3 results) No results for input(s): BNP in the last 8760 hours. Basic Metabolic Panel: Recent Labs  Lab 04/23/17 0042 04/23/17 0924  NA 138 138  K 4.0 3.8  CL 107 106  CO2 20* 19*  GLUCOSE 173* 168*  BUN 34* 30*  CREATININE 2.07* 1.98*  CALCIUM 9.0 8.8*   Liver Function Tests: No results for input(s): AST, ALT, ALKPHOS, BILITOT, PROT, ALBUMIN in the last 168 hours. No results for input(s): LIPASE, AMYLASE in the last 168 hours. No results for input(s): AMMONIA in the last 168 hours. CBC: Recent Labs   Lab 04/23/17 0042  WBC 11.7*  NEUTROABS 9.7*  HGB 10.6*  HCT 32.3*  MCV 89.5  PLT 186   Cardiac Enzymes: No results for input(s): CKTOTAL, CKMB, CKMBINDEX, TROPONINI in the last 168 hours. BNP: Invalid input(s): POCBNP CBG: Recent Labs  Lab 04/23/17 0614 04/23/17 1101 04/23/17 1531  GLUCAP 131* 125* 94   D-Dimer No results for input(s): DDIMER in the last 72 hours. Hgb A1c No results for input(s): HGBA1C in the last 72 hours. Lipid Profile No results for input(s): CHOL, HDL, LDLCALC, TRIG, CHOLHDL, LDLDIRECT in the last 72 hours. Thyroid function studies No results for input(s): TSH, T4TOTAL, T3FREE, THYROIDAB in the last 72 hours.  Invalid input(s): FREET3 Anemia work up No results for input(s): VITAMINB12, FOLATE, FERRITIN, TIBC, IRON, RETICCTPCT in the last 72 hours. Urinalysis No results found for: COLORURINE, APPEARANCEUR, LABSPEC, PHURINE, GLUCOSEU, HGBUR, BILIRUBINUR, KETONESUR, PROTEINUR, UROBILINOGEN, NITRITE, LEUKOCYTESUR Sepsis Labs Invalid input(s): PROCALCITONIN,  WBC,  LACTICIDVEN Microbiology No results found for this or any previous visit (from the past 240 hour(s)).   Time coordinating discharge: Over 30 minutes  SIGNED:   Calvert Cantor, MD  [facility] 04/23/2017, 3:49 PM Pager   If 7PM-7AM, please contact night-coverage [email or web address] Password [email or web address]

## 2017-05-12 ENCOUNTER — Other Ambulatory Visit: Payer: Self-pay | Admitting: *Deleted

## 2017-05-12 ENCOUNTER — Other Ambulatory Visit: Payer: Self-pay | Admitting: Internal Medicine

## 2017-05-12 ENCOUNTER — Ambulatory Visit
Admission: RE | Admit: 2017-05-12 | Discharge: 2017-05-12 | Disposition: A | Discharge status: Home / Self Care | Payer: Medicare Other | Source: Ambulatory Visit | Attending: Internal Medicine | Admitting: Internal Medicine

## 2017-05-12 DIAGNOSIS — R413 Other amnesia: Secondary | ICD-10-CM

## 2017-06-12 ENCOUNTER — Other Ambulatory Visit: Payer: Self-pay | Admitting: Cardiovascular Disease

## 2017-06-12 NOTE — Telephone Encounter (Signed)
REFILL 

## 2017-06-18 ENCOUNTER — Emergency Department (HOSPITAL_COMMUNITY)
Admission: EM | Admit: 2017-06-18 | Discharge: 2017-06-18 | Disposition: A | Discharge status: Home / Self Care | Payer: Medicare Other | Attending: Emergency Medicine | Admitting: Emergency Medicine

## 2017-06-18 ENCOUNTER — Encounter (HOSPITAL_COMMUNITY): Payer: Self-pay | Admitting: Emergency Medicine

## 2017-06-18 ENCOUNTER — Emergency Department (HOSPITAL_COMMUNITY): Payer: Medicare Other

## 2017-06-18 DIAGNOSIS — Y9301 Activity, walking, marching and hiking: Secondary | ICD-10-CM | POA: Insufficient documentation

## 2017-06-18 DIAGNOSIS — W010XXA Fall on same level from slipping, tripping and stumbling without subsequent striking against object, initial encounter: Secondary | ICD-10-CM | POA: Insufficient documentation

## 2017-06-18 DIAGNOSIS — I2510 Atherosclerotic heart disease of native coronary artery without angina pectoris: Secondary | ICD-10-CM | POA: Insufficient documentation

## 2017-06-18 DIAGNOSIS — Z79899 Other long term (current) drug therapy: Secondary | ICD-10-CM | POA: Insufficient documentation

## 2017-06-18 DIAGNOSIS — E1122 Type 2 diabetes mellitus with diabetic chronic kidney disease: Secondary | ICD-10-CM | POA: Diagnosis not present

## 2017-06-18 DIAGNOSIS — Z87891 Personal history of nicotine dependence: Secondary | ICD-10-CM | POA: Diagnosis not present

## 2017-06-18 DIAGNOSIS — S0100XA Unspecified open wound of scalp, initial encounter: Secondary | ICD-10-CM | POA: Diagnosis not present

## 2017-06-18 DIAGNOSIS — S51801A Unspecified open wound of right forearm, initial encounter: Secondary | ICD-10-CM | POA: Diagnosis not present

## 2017-06-18 DIAGNOSIS — Z7984 Long term (current) use of oral hypoglycemic drugs: Secondary | ICD-10-CM | POA: Insufficient documentation

## 2017-06-18 DIAGNOSIS — Y929 Unspecified place or not applicable: Secondary | ICD-10-CM | POA: Diagnosis not present

## 2017-06-18 DIAGNOSIS — T148XXA Other injury of unspecified body region, initial encounter: Secondary | ICD-10-CM

## 2017-06-18 DIAGNOSIS — I129 Hypertensive chronic kidney disease with stage 1 through stage 4 chronic kidney disease, or unspecified chronic kidney disease: Secondary | ICD-10-CM | POA: Insufficient documentation

## 2017-06-18 DIAGNOSIS — W19XXXA Unspecified fall, initial encounter: Secondary | ICD-10-CM

## 2017-06-18 DIAGNOSIS — S20412A Abrasion of left back wall of thorax, initial encounter: Secondary | ICD-10-CM | POA: Diagnosis not present

## 2017-06-18 DIAGNOSIS — M25552 Pain in left hip: Secondary | ICD-10-CM | POA: Insufficient documentation

## 2017-06-18 DIAGNOSIS — Y999 Unspecified external cause status: Secondary | ICD-10-CM | POA: Insufficient documentation

## 2017-06-18 DIAGNOSIS — S0990XA Unspecified injury of head, initial encounter: Secondary | ICD-10-CM | POA: Diagnosis present

## 2017-06-18 DIAGNOSIS — R0781 Pleurodynia: Secondary | ICD-10-CM | POA: Insufficient documentation

## 2017-06-18 DIAGNOSIS — N183 Chronic kidney disease, stage 3 (moderate): Secondary | ICD-10-CM | POA: Insufficient documentation

## 2017-06-18 DIAGNOSIS — Z96641 Presence of right artificial hip joint: Secondary | ICD-10-CM | POA: Diagnosis not present

## 2017-06-18 LAB — CBC
HEMATOCRIT: 32.5 % — AB (ref 39.0–52.0)
Hemoglobin: 10.1 g/dL — ABNORMAL LOW (ref 13.0–17.0)
MCH: 28.4 pg (ref 26.0–34.0)
MCHC: 31.1 g/dL (ref 30.0–36.0)
MCV: 91.3 fL (ref 78.0–100.0)
Platelets: 189 10*3/uL (ref 150–400)
RBC: 3.56 MIL/uL — ABNORMAL LOW (ref 4.22–5.81)
RDW: 15 % (ref 11.5–15.5)
WBC: 10.4 10*3/uL (ref 4.0–10.5)

## 2017-06-18 LAB — BASIC METABOLIC PANEL
Anion gap: 7 (ref 5–15)
BUN: 26 mg/dL — AB (ref 6–20)
CALCIUM: 9 mg/dL (ref 8.9–10.3)
CO2: 25 mmol/L (ref 22–32)
CREATININE: 1.84 mg/dL — AB (ref 0.61–1.24)
Chloride: 109 mmol/L (ref 101–111)
GFR calc non Af Amer: 30 mL/min — ABNORMAL LOW (ref >60)
GFR, EST AFRICAN AMERICAN: 35 mL/min — AB (ref >60)
Glucose, Bld: 187 mg/dL — ABNORMAL HIGH (ref 65–99)
Potassium: 4.3 mmol/L (ref 3.5–5.1)
Sodium: 141 mmol/L (ref 135–145)

## 2017-06-18 NOTE — ED Provider Notes (Signed)
Patient placed in Quick Look pathway, seen and evaluated   Chief Complaint: fall  HPI: Patient presents with right forearm skin tear, back pain, left-sided rib pain, after a trip and fall.  Patient was using his walker when his foot got caught and he fell around his walker.  he denies any chest pain, shortness of breath, dizziness, lightheadedness, nausea, vomiting, visual disturbances.  No symptoms prior to fall.  Nothing taken for pain.  Denies any headache or neck pain.  No weakness or numbness. Patient declines analgesia at this time.  ROS: no headache, dizziness, nausea, weakness, numbness  Physical Exam:   Gen: No distress  Neuro: Awake and Alert  Skin: Warm    Focused Exam: Skin tear to the right forearm, bleeding controlled. Skin tear abrasion to the occiput. Midline tenderness to palpation of the thoracic spine with overlying erythema and ecchymosis.  No midline tenderness to palpation of the cervical spine and lumbar spine.  No tenderness palpation of the hips, knees, elbows.  Tender to palpation of the left ribs. Pupils are Perrl, 5/5 strength in upper and lower extremities bilaterally.  He has been ambulating since the fall.  He is alert and oriented x4.   Initiation of care has begun. The patient has been counseled on the process, plan, and necessity for staying for the completion/evaluation, and the remainder of the medical screening examination    Gregary Cromer 06/18/17 1701    Little, Ambrose Finland, MD 06/26/17 1620

## 2017-06-18 NOTE — ED Provider Notes (Signed)
[facility] EMERGENCY DEPARTMENT Provider Note   CSN: [id number] Arrival date & time: 06/18/17  1537     History   Chief Complaint Chief Complaint  Patient presents with  . Fall    HPI [name] is a 84 y.o. male.  HPI   Using roller walker when got tangled up in it, fell down. Hit head, reports bump on the head and had headache briefly that improved.  No neck pain.  Reports left hip pain.  Left back pain.. Wife thought it was back pain, across ribs, and thinks that is what is hurting. Has abrasion over area. Several skin tears to right arm. Is UTD on tetanus.  Has been walking since he fell with the walker.  No other acute concerns.   Past Medical History:  Diagnosis Date  . Anemia   . Blood dyscrasia    ' free bleeder' per patient   . Coronary artery disease    3 coronary stents- followed by Dr Gwenlyn Saran   . Diabetes mellitus without complication (HCC)    type II   . Essential hypertension 10/22/2014  . GERD (gastroesophageal reflux disease)   . Multiple abrasions    on arms due to stumbling over chair on 11/19/16 per patient     Patient Active Problem List   Diagnosis Date Noted  . 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 Dyslipidemia 10/05/2015  . CAD -S/P LAD and CFX DES 10/26/14 10/25/2014  . Abnormal nuclear cardiac imaging test 10/23/2014  . Carotid bruit-Rt-dopplers OK 10/23/2014  . GERD (gastroesophageal reflux disease) 10/23/2014  . 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 Essential hypertension 10/22/2014  . Chronic renal disease, stage III (HCC) 10/22/2014  . 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 Anginal pain (HCC) 10/21/2014  . Lap [facility] Myotomy Oct 2013 05/07/2012    Past Surgical History:  Procedure Laterality Date  . CARDIAC CATHETERIZATION N/A 10/25/2014   Procedure: Left Heart Cath and Coronary Angiography;  Surgeon: Kathleene Hazel, MD;   Location: [facility] INVASIVE CV LAB;  Service: Cardiovascular;  Laterality: N/A;  . CARDIAC CATHETERIZATION N/A 10/26/2014   Procedure: Coronary Stent Intervention;  Surgeon: Kathleene Hazel, MD;  Location: [facility] INVASIVE CV LAB;  Service: Cardiovascular;  Laterality: N/A;  . ESOPHAGOGASTRODUODENOSCOPY (EGD) WITH PROPOFOL N/A 11/25/2016   Procedure: ESOPHAGOGASTRODUODENOSCOPY (EGD) WITH PROPOFOL;  Surgeon: Carman Ching, MD;  Location: [facility] ENDOSCOPY;  Service: Endoscopy;  Laterality: N/A;  . HELLER MYOTOMY  12/17/2011   Procedure: LAPAROSCOPIC HELLER MYOTOMY;  Surgeon: Valarie Merino, MD;  Location: [facility] ORS;  Service: General;  Laterality: N/A;  . HERNIA REPAIR  1952  . JOINT REPLACEMENT  2007   rt hip   . SAVORY DILATION N/A 11/25/2016   Procedure: SAVORY DILATION;  Surgeon: Carman Ching, MD;  Location: [facility] ENDOSCOPY;  Service: Endoscopy;  Laterality: N/A;  . UPPER GI ENDOSCOPY  12/17/2011   Procedure: UPPER GI ENDOSCOPY;  Surgeon: Valarie Merino, MD;  Location: [facility] ORS;  Service: General;  Laterality: N/A;        Home Medications    Prior to Admission medications   Medication Sig Start Date End Date Taking? Authorizing Provider  acetaminophen (TYLENOL) 325 MG tablet Take 650 mg by mouth every 6 (six) hours as needed.   Yes [provider]  amLODipine (NORVASC) 2.5 MG tablet Take 2.5 mg by mouth daily.    Yes [provider]  atorvastatin (LIPITOR)  10 MG tablet Take 10 mg by mouth every evening. 02/21/17  Yes [provider]  docusate sodium (COLACE) 100 MG capsule Take 100 mg by mouth 2 (two) times daily as needed for mild constipation.   Yes [provider]  ferrous sulfate 325 (65 FE) MG tablet Take 325 mg by mouth every Monday, Wednesday, and Friday.    Yes [provider]  metFORMIN (GLUCOPHAGE-XR) 500 MG 24 hr tablet Take 1 tablet (500 mg total) by mouth every morning. Patient taking differently: Take 500 mg by mouth every evening.  10/29/14   Yes Kilroy, Luke K, PA-C  metoprolol tartrate (LOPRESSOR) 25 MG tablet Take 1 tablet (25 mg total) by mouth 2 (two) times daily. NEED OV. 06/12/17  Yes Croitoru, Mihai, MD  Multiple Vitamin (MULTIVITAMIN) capsule Take 1 capsule by mouth daily.   Yes [provider]  nitroGLYCERIN (NITROSTAT) 0.4 MG SL tablet Place 0.4 mg under the tongue every 5 (five) minutes as needed for chest pain.   Yes [provider]  omeprazole (PRILOSEC) 10 MG capsule Take 10 mg by mouth daily.   Yes [provider]  cephALEXin (KEFLEX) 500 MG capsule Take 500 mg by mouth 2 (two) times daily. 06/18/17   [provider]    Family History Family History  Problem Relation Age of Onset  . Heart disease Mother   . Heart disease Father     Social History Social History   Tobacco Use  . Smoking status: Former Games developer  . Smokeless tobacco: Never Used  . Tobacco comment: stopped 65yrs ago  Substance Use Topics  . Alcohol use: No  . Drug use: No     Allergies   Patient has no known allergies.   Review of Systems Review of Systems  Constitutional: Negative for fever.  HENT: Negative for sore throat.   Eyes: Negative for visual disturbance.  Respiratory: Negative for cough and shortness of breath.   Cardiovascular: Negative for chest pain.  Gastrointestinal: Negative for abdominal pain, nausea and vomiting.  Genitourinary: Negative for difficulty urinating.  Musculoskeletal: Positive for back pain. Negative for neck stiffness. Joint swelling: left posterior rib pain.  Skin: Negative for rash.  Neurological: Negative for syncope and headaches.     Physical Exam Updated Vital Signs BP (!) 163/70 (BP Location: Left Arm)   Pulse 66   Temp 97.8 F (36.6 C) (Oral)   Resp 20   SpO2 98%   Physical Exam  Constitutional: He is oriented to person, place, and time. He appears well-developed and well-nourished. No distress.  HENT:  Head: Normocephalic.  Skin tear posterior  scalp 2cm  Eyes: Pupils are equal, round, and reactive to light. Conjunctivae and EOM are normal.  Neck: Normal range of motion.  Cardiovascular: Normal rate, regular rhythm, normal heart sounds and intact distal pulses. Exam reveals no gallop and no friction rub.  No murmur heard. Pulmonary/Chest: Effort normal and breath sounds normal. No respiratory distress. He has no wheezes. He has no rales.  Abdominal: Soft. He exhibits no distension. There is no tenderness. There is no guarding.  Musculoskeletal: He exhibits no edema.       Cervical back: He exhibits no bony tenderness.       Thoracic back: He exhibits tenderness (left sided, no midline tenderness). He exhibits no bony tenderness.       Lumbar back: He exhibits no bony tenderness.  Neurological: He is alert and oriented to person, place, and time.  Skin: Skin is warm  and dry. He is not diaphoretic.  Skin tear right forearm, posterior scalp, abrasion to left back  Nursing note and vitals reviewed.    ED Treatments / Results  Labs (all labs ordered are listed, but only abnormal results are displayed) Labs Reviewed  CBC - Abnormal; Notable for the following components:      Result Value   RBC 3.56 (*)    Hemoglobin 10.1 (*)    HCT 32.5 (*)    All other components within normal limits  BASIC METABOLIC PANEL - Abnormal; Notable for the following components:   Glucose, Bld 187 (*)    BUN 26 (*)    Creatinine, Ser 1.84 (*)    GFR calc non Af Amer 30 (*)    GFR calc Af Amer 35 (*)    All other components within normal limits    EKG None  Radiology Dg Chest 2 View  Result Date: 06/18/2017 CLINICAL DATA:  Fall earlier today. EXAM: CHEST - 2 VIEW COMPARISON:  10/03/2015 FINDINGS: Lungs are hyperexpanded. Interstitial markings are diffusely coarsened with chronic features. No overt edema or pleural effusion. No focal airspace consolidation. Bones are diffusely demineralized. Multiple compression fractures are identified in the  thoracic and lumbar spine. IMPRESSION: Hyperexpansion with underlying chronic interstitial changes. No acute cardiopulmonary process. Osteopenia with multiple compression fractures in the thoracolumbar spine. Electronically Signed   By: Kennith Center M.D.   On: 06/18/2017 18:08   Dg Thoracic Spine 2 View  Result Date: 06/18/2017 CLINICAL DATA:  Fall.  Pain. EXAM: THORACIC SPINE 2 VIEWS COMPARISON:  None. FINDINGS: Bones are diffusely demineralized. Age-indeterminate compression fractures are identified at T2, T4, T5, T11, T12, and L1. the most advanced compression deformity is at T4. IMPRESSION: Multiple age indeterminate compression fractures in the thoracolumbar spine. Electronically Signed   By: Kennith Center M.D.   On: 06/18/2017 18:10   Ct Head Wo Contrast  Result Date: 06/18/2017 CLINICAL DATA:  Fall, abrasion to posterior head EXAM: CT HEAD WITHOUT CONTRAST CT CERVICAL SPINE WITHOUT CONTRAST TECHNIQUE: Multidetector CT imaging of the head and cervical spine was performed following the standard protocol without intravenous contrast. Multiplanar CT image reconstructions of the cervical spine were also generated. COMPARISON:  CT head dated 05/12/2017. CT head/cervical spine dated 04/22/2017. FINDINGS: CT HEAD FINDINGS Brain: No evidence of acute infarction, hemorrhage, hydrocephalus, extra-axial collection or mass lesion/mass effect. Global cortical atrophy. Mild subcortical white matter and periventricular small vessel ischemic changes. Vascular: Intracranial atherosclerosis. Skull: Normal. Negative for fracture or focal lesion. Sinuses/Orbits: The visualized paranasal sinuses are essentially clear. The mastoid air cells are unopacified. Other: None. CT CERVICAL SPINE FINDINGS Alignment: Normal cervical lordosis. Skull base and vertebrae: No acute fracture. No primary bone lesion or focal pathologic process. Soft tissues and spinal canal: No prevertebral fluid or swelling. No visible canal hematoma. Disc  levels:  Mild degenerative changes of the mid cervical spine. Spinal canal is patent. Upper chest: Visualized lung apices are clear. Other: Visualized thyroid is unremarkable. IMPRESSION: No evidence of acute intracranial abnormality. Atrophy with small vessel ischemic changes. No evidence of traumatic injury to the cervical spine. Mild degenerative changes. Electronically Signed   By: Charline Bills M.D.   On: 06/18/2017 18:32   Ct Cervical Spine Wo Contrast  Result Date: 06/18/2017 CLINICAL DATA:  Fall, abrasion to posterior head EXAM: CT HEAD WITHOUT CONTRAST CT CERVICAL SPINE WITHOUT CONTRAST TECHNIQUE: Multidetector CT imaging of the head and cervical spine was performed following the standard protocol without intravenous contrast.  Multiplanar CT image reconstructions of the cervical spine were also generated. COMPARISON:  CT head dated 05/12/2017. CT head/cervical spine dated 04/22/2017. FINDINGS: CT HEAD FINDINGS Brain: No evidence of acute infarction, hemorrhage, hydrocephalus, extra-axial collection or mass lesion/mass effect. Global cortical atrophy. Mild subcortical white matter and periventricular small vessel ischemic changes. Vascular: Intracranial atherosclerosis. Skull: Normal. Negative for fracture or focal lesion. Sinuses/Orbits: The visualized paranasal sinuses are essentially clear. The mastoid air cells are unopacified. Other: None. CT CERVICAL SPINE FINDINGS Alignment: Normal cervical lordosis. Skull base and vertebrae: No acute fracture. No primary bone lesion or focal pathologic process. Soft tissues and spinal canal: No prevertebral fluid or swelling. No visible canal hematoma. Disc levels:  Mild degenerative changes of the mid cervical spine. Spinal canal is patent. Upper chest: Visualized lung apices are clear. Other: Visualized thyroid is unremarkable. IMPRESSION: No evidence of acute intracranial abnormality. Atrophy with small vessel ischemic changes. No evidence of traumatic  injury to the cervical spine. Mild degenerative changes. Electronically Signed   By: Charline Bills M.D.   On: 06/18/2017 18:32    Procedures Procedures (including critical care time)  Medications Ordered in ED Medications - No data to display   Initial Impression / Assessment and Plan / ED Course  I have reviewed the triage vital signs and the nursing notes.  Pertinent labs & imaging results that were available during my care of the patient were reviewed by me and considered in my medical decision making (see chart for details).     84yo male with history above presents with concern for mechanical fall while walking with walker.  CT head and cervical spine without acute abnormalities. XR chest without signs of rib fracture or pneumothorax. Discussed occult rib fracture is possible, however given patient without dyspnea, no pain with deep breaths, no splinting, doubt fracture.  XR thoracic spine with age indeterminate compression fractures--no midline tenderness on exam, doubt acute compression fracture.  Patient without signs other injuries on history or exam, well appearing, no acute medical concerns. Recommend wound care, PCP follow up. Patient discharged in stable condition with understanding of reasons to return.   Final Clinical Impressions(s) / ED Diagnoses   Final diagnoses:  Fall, initial encounter  Multiple skin tears    ED Discharge Orders    None       Alvira Monday, MD 06/19/17 1318

## 2017-06-18 NOTE — ED Notes (Signed)
ED Provider at bedside. 

## 2017-06-18 NOTE — ED Triage Notes (Signed)
The pt uses a roll walker. This morning he had a mechanical fall, he has an abrasion to right posterior head, skin tear to right upper arm, pain to left ribs, abrasion to left back. Denies LOC or dizziness. Denies headache or visual disturbances.

## 2017-07-02 ENCOUNTER — Other Ambulatory Visit: Payer: Self-pay

## 2017-07-03 ENCOUNTER — Encounter (HOSPITAL_COMMUNITY): Payer: Self-pay | Admitting: Emergency Medicine

## 2017-07-03 ENCOUNTER — Other Ambulatory Visit: Payer: Self-pay

## 2017-07-03 ENCOUNTER — Inpatient Hospital Stay (HOSPITAL_COMMUNITY)
Admission: EM | Admit: 2017-07-03 | Discharge: 2017-07-06 | DRG: 184 | Disposition: A | Discharge status: Skilled Nursing Facility | Payer: Medicare Other | Attending: Internal Medicine | Admitting: Internal Medicine

## 2017-07-03 ENCOUNTER — Emergency Department (HOSPITAL_COMMUNITY): Payer: Medicare Other

## 2017-07-03 DIAGNOSIS — E1122 Type 2 diabetes mellitus with diabetic chronic kidney disease: Secondary | ICD-10-CM | POA: Diagnosis present

## 2017-07-03 DIAGNOSIS — I2510 Atherosclerotic heart disease of native coronary artery without angina pectoris: Secondary | ICD-10-CM | POA: Diagnosis not present

## 2017-07-03 DIAGNOSIS — G903 Multi-system degeneration of the autonomic nervous system: Secondary | ICD-10-CM

## 2017-07-03 DIAGNOSIS — S0990XA Unspecified injury of head, initial encounter: Secondary | ICD-10-CM | POA: Diagnosis not present

## 2017-07-03 DIAGNOSIS — Z87891 Personal history of nicotine dependence: Secondary | ICD-10-CM

## 2017-07-03 DIAGNOSIS — S22029A Unspecified fracture of second thoracic vertebra, initial encounter for closed fracture: Secondary | ICD-10-CM

## 2017-07-03 DIAGNOSIS — H9190 Unspecified hearing loss, unspecified ear: Secondary | ICD-10-CM | POA: Diagnosis present

## 2017-07-03 DIAGNOSIS — E1143 Type 2 diabetes mellitus with diabetic autonomic (poly)neuropathy: Secondary | ICD-10-CM | POA: Diagnosis present

## 2017-07-03 DIAGNOSIS — R42 Dizziness and giddiness: Secondary | ICD-10-CM

## 2017-07-03 DIAGNOSIS — Z8249 Family history of ischemic heart disease and other diseases of the circulatory system: Secondary | ICD-10-CM

## 2017-07-03 DIAGNOSIS — Z96641 Presence of right artificial hip joint: Secondary | ICD-10-CM | POA: Diagnosis present

## 2017-07-03 DIAGNOSIS — S22019A Unspecified fracture of first thoracic vertebra, initial encounter for closed fracture: Secondary | ICD-10-CM

## 2017-07-03 DIAGNOSIS — S2242XA Multiple fractures of ribs, left side, initial encounter for closed fracture: Secondary | ICD-10-CM

## 2017-07-03 DIAGNOSIS — S06369A Traumatic hemorrhage of cerebrum, unspecified, with loss of consciousness of unspecified duration, initial encounter: Secondary | ICD-10-CM | POA: Diagnosis present

## 2017-07-03 DIAGNOSIS — E860 Dehydration: Secondary | ICD-10-CM | POA: Diagnosis present

## 2017-07-03 DIAGNOSIS — I951 Orthostatic hypotension: Secondary | ICD-10-CM | POA: Diagnosis present

## 2017-07-03 DIAGNOSIS — M47814 Spondylosis without myelopathy or radiculopathy, thoracic region: Secondary | ICD-10-CM | POA: Diagnosis present

## 2017-07-03 DIAGNOSIS — W19XXXA Unspecified fall, initial encounter: Secondary | ICD-10-CM | POA: Diagnosis present

## 2017-07-03 DIAGNOSIS — K219 Gastro-esophageal reflux disease without esophagitis: Secondary | ICD-10-CM | POA: Diagnosis present

## 2017-07-03 DIAGNOSIS — I129 Hypertensive chronic kidney disease with stage 1 through stage 4 chronic kidney disease, or unspecified chronic kidney disease: Secondary | ICD-10-CM | POA: Diagnosis present

## 2017-07-03 DIAGNOSIS — E785 Hyperlipidemia, unspecified: Secondary | ICD-10-CM | POA: Diagnosis present

## 2017-07-03 DIAGNOSIS — Y939 Activity, unspecified: Secondary | ICD-10-CM

## 2017-07-03 DIAGNOSIS — Z955 Presence of coronary angioplasty implant and graft: Secondary | ICD-10-CM

## 2017-07-03 DIAGNOSIS — G909 Disorder of the autonomic nervous system, unspecified: Secondary | ICD-10-CM | POA: Diagnosis not present

## 2017-07-03 DIAGNOSIS — S2243XA Multiple fractures of ribs, bilateral, initial encounter for closed fracture: Principal | ICD-10-CM | POA: Diagnosis present

## 2017-07-03 DIAGNOSIS — W1830XA Fall on same level, unspecified, initial encounter: Secondary | ICD-10-CM | POA: Diagnosis present

## 2017-07-03 DIAGNOSIS — S22011A Stable burst fracture of first thoracic vertebra, initial encounter for closed fracture: Secondary | ICD-10-CM | POA: Diagnosis present

## 2017-07-03 DIAGNOSIS — Y92129 Unspecified place in nursing home as the place of occurrence of the external cause: Secondary | ICD-10-CM

## 2017-07-03 DIAGNOSIS — D631 Anemia in chronic kidney disease: Secondary | ICD-10-CM | POA: Diagnosis not present

## 2017-07-03 DIAGNOSIS — E119 Type 2 diabetes mellitus without complications: Secondary | ICD-10-CM

## 2017-07-03 DIAGNOSIS — Z9861 Coronary angioplasty status: Secondary | ICD-10-CM

## 2017-07-03 DIAGNOSIS — D649 Anemia, unspecified: Secondary | ICD-10-CM

## 2017-07-03 DIAGNOSIS — I10 Essential (primary) hypertension: Secondary | ICD-10-CM | POA: Diagnosis present

## 2017-07-03 DIAGNOSIS — E1121 Type 2 diabetes mellitus with diabetic nephropathy: Secondary | ICD-10-CM | POA: Diagnosis not present

## 2017-07-03 DIAGNOSIS — Z7984 Long term (current) use of oral hypoglycemic drugs: Secondary | ICD-10-CM

## 2017-07-03 DIAGNOSIS — Z9889 Other specified postprocedural states: Secondary | ICD-10-CM

## 2017-07-03 DIAGNOSIS — S06360A Traumatic hemorrhage of cerebrum, unspecified, without loss of consciousness, initial encounter: Secondary | ICD-10-CM | POA: Diagnosis not present

## 2017-07-03 DIAGNOSIS — N183 Chronic kidney disease, stage 3 unspecified: Secondary | ICD-10-CM | POA: Diagnosis present

## 2017-07-03 DIAGNOSIS — S2242XD Multiple fractures of ribs, left side, subsequent encounter for fracture with routine healing: Secondary | ICD-10-CM | POA: Diagnosis not present

## 2017-07-03 DIAGNOSIS — S2242XG Multiple fractures of ribs, left side, subsequent encounter for fracture with delayed healing: Secondary | ICD-10-CM | POA: Diagnosis not present

## 2017-07-03 DIAGNOSIS — S0636AA Traumatic hemorrhage of cerebrum, unspecified, with loss of consciousness status unknown, initial encounter: Secondary | ICD-10-CM | POA: Diagnosis present

## 2017-07-03 LAB — CBC WITH DIFFERENTIAL/PLATELET
Abs Immature Granulocytes: 0 10*3/uL (ref 0.0–0.1)
Basophils Absolute: 0 10*3/uL (ref 0.0–0.1)
Basophils Relative: 1 %
EOS ABS: 0.3 10*3/uL (ref 0.0–0.7)
EOS PCT: 4 %
HEMATOCRIT: 33.1 % — AB (ref 39.0–52.0)
HEMOGLOBIN: 10.7 g/dL — AB (ref 13.0–17.0)
Immature Granulocytes: 1 %
LYMPHS ABS: 1.7 10*3/uL (ref 0.7–4.0)
LYMPHS PCT: 20 %
MCH: 29.3 pg (ref 26.0–34.0)
MCHC: 32.3 g/dL (ref 30.0–36.0)
MCV: 90.7 fL (ref 78.0–100.0)
Monocytes Absolute: 0.6 10*3/uL (ref 0.1–1.0)
Monocytes Relative: 7 %
Neutro Abs: 5.6 10*3/uL (ref 1.7–7.7)
Neutrophils Relative %: 67 %
Platelets: 219 10*3/uL (ref 150–400)
RBC: 3.65 MIL/uL — AB (ref 4.22–5.81)
RDW: 14.4 % (ref 11.5–15.5)
WBC: 8.3 10*3/uL (ref 4.0–10.5)

## 2017-07-03 LAB — COMPREHENSIVE METABOLIC PANEL
ALBUMIN: 3.2 g/dL — AB (ref 3.5–5.0)
ALT: 11 U/L — ABNORMAL LOW (ref 17–63)
AST: 13 U/L — AB (ref 15–41)
Alkaline Phosphatase: 136 U/L — ABNORMAL HIGH (ref 38–126)
Anion gap: 8 (ref 5–15)
BILIRUBIN TOTAL: 1 mg/dL (ref 0.3–1.2)
BUN: 25 mg/dL — AB (ref 6–20)
CO2: 25 mmol/L (ref 22–32)
Calcium: 9.2 mg/dL (ref 8.9–10.3)
Chloride: 109 mmol/L (ref 101–111)
Creatinine, Ser: 1.8 mg/dL — ABNORMAL HIGH (ref 0.61–1.24)
GFR calc Af Amer: 36 mL/min — ABNORMAL LOW (ref >60)
GFR calc non Af Amer: 31 mL/min — ABNORMAL LOW (ref >60)
Glucose, Bld: 115 mg/dL — ABNORMAL HIGH (ref 65–99)
POTASSIUM: 3.8 mmol/L (ref 3.5–5.1)
Sodium: 142 mmol/L (ref 135–145)
TOTAL PROTEIN: 5.7 g/dL — AB (ref 6.5–8.1)

## 2017-07-03 LAB — URINALYSIS, ROUTINE W REFLEX MICROSCOPIC
BILIRUBIN URINE: NEGATIVE
GLUCOSE, UA: NEGATIVE mg/dL
HGB URINE DIPSTICK: NEGATIVE
Ketones, ur: NEGATIVE mg/dL
Leukocytes, UA: NEGATIVE
Nitrite: NEGATIVE
Protein, ur: NEGATIVE mg/dL
SPECIFIC GRAVITY, URINE: 1.018 (ref 1.005–1.030)
pH: 6 (ref 5.0–8.0)

## 2017-07-03 LAB — TROPONIN I: Troponin I: <0.03 ng/mL (ref <0.03)

## 2017-07-03 MED ORDER — ATORVASTATIN CALCIUM 10 MG PO TABS
5.0000 mg | ORAL_TABLET | Freq: Every evening | ORAL | Status: DC
  Administered 2017-07-04 – 2017-07-05 (×2): 5 mg via ORAL
  Filled 2017-07-03: qty 1
  Filled 2017-07-03: qty 0.5
  Filled 2017-07-03: qty 1

## 2017-07-03 MED ORDER — ACETAMINOPHEN 650 MG RE SUPP: 650.0000 mg | Freq: Four times a day (QID) | RECTAL | Status: DC | PRN

## 2017-07-03 MED ORDER — SODIUM CHLORIDE 0.9 % IV SOLN
INTRAVENOUS | Status: DC
  Administered 2017-07-03 – 2017-07-04 (×3): via INTRAVENOUS

## 2017-07-03 MED ORDER — ONDANSETRON HCL 4 MG PO TABS: 4.0000 mg | ORAL_TABLET | Freq: Four times a day (QID) | ORAL | Status: DC | PRN

## 2017-07-03 MED ORDER — ACETAMINOPHEN 325 MG PO TABS: 650.0000 mg | ORAL_TABLET | Freq: Four times a day (QID) | ORAL | Status: DC | PRN

## 2017-07-03 MED ORDER — SENNOSIDES-DOCUSATE SODIUM 8.6-50 MG PO TABS: 1.0000 | ORAL_TABLET | Freq: Every evening | ORAL | Status: DC | PRN

## 2017-07-03 MED ORDER — PANTOPRAZOLE SODIUM 40 MG PO TBEC
40.0000 mg | DELAYED_RELEASE_TABLET | Freq: Every day | ORAL | Status: DC
Start: 2017-07-03 — End: 2017-07-06
  Administered 2017-07-03 – 2017-07-06 (×4): 40 mg via ORAL
  Filled 2017-07-03 (×4): qty 1

## 2017-07-03 MED ORDER — DOCUSATE SODIUM 100 MG PO CAPS: 100.0000 mg | ORAL_CAPSULE | Freq: Two times a day (BID) | ORAL | Status: DC | PRN

## 2017-07-03 MED ORDER — ACETAMINOPHEN 500 MG PO TABS
1000.0000 mg | ORAL_TABLET | Freq: Once | ORAL | Status: AC
Start: 2017-07-03 — End: 2017-07-03
  Administered 2017-07-03: 1000 mg via ORAL
  Filled 2017-07-03: qty 2

## 2017-07-03 MED ORDER — FERROUS SULFATE 325 (65 FE) MG PO TABS
325.0000 mg | ORAL_TABLET | ORAL | Status: DC
  Administered 2017-07-04: 325 mg via ORAL
  Filled 2017-07-03: qty 1

## 2017-07-03 MED ORDER — SODIUM CHLORIDE 0.9 % IV BOLUS
500.0000 mL | Freq: Once | INTRAVENOUS | Status: AC
  Administered 2017-07-03: 500 mL via INTRAVENOUS

## 2017-07-03 MED ORDER — BISACODYL 10 MG RE SUPP: 10.0000 mg | Freq: Every day | RECTAL | Status: DC | PRN

## 2017-07-03 MED ORDER — ONDANSETRON HCL 4 MG/2ML IJ SOLN: 4.0000 mg | Freq: Four times a day (QID) | INTRAMUSCULAR | Status: DC | PRN

## 2017-07-03 MED ORDER — HYDRALAZINE HCL 20 MG/ML IJ SOLN: 5.0000 mg | Freq: Three times a day (TID) | INTRAMUSCULAR | Status: DC | PRN

## 2017-07-03 NOTE — ED Notes (Signed)
Attempted to call report, RN to call back shortly. 

## 2017-07-03 NOTE — ED Notes (Signed)
Provider paged that EKG completed at this time.

## 2017-07-03 NOTE — H&P (Signed)
History and Physical    [name] OIB:[id number] DOB: 06-Apr-1922 DOA: 07/03/2017   PCP: Lajean Manes, MD   Patient coming from:  Home    Chief Complaint: Falls and dizziness   HPI: [name] is a 84 y.o. male with medical history significant for HTN, HLD, CKD stage III, COPD, CAD this post cardiac catheterization in September 2016 stent to the circumflex, hyperlipidemia, diabetes, GERD, mild carotid artery disease bilaterally history of traumatic intracranial hemorrhage and subdural hematoma on 04/22/2017, admitted to the hospital and discharged on 04/23/2017 after suffering a fall due to dizziness, cleared by neurosurgery.  He also was seen on May 1 after another fall, felt to be mechanical, after another episode of dizziness.  Since that evaluation, he has become more symptomatic, with decreased p.o. intake, and initial confusion likely resolved.  The patient reports awakening in the middle of the night to go to the restroom, graft onto a wire.  Baker's rack as his walker was across the room, but fell before he was able to grab the rack.  His daughter reports that this dizziness is patiently becomes more evident after turning his head to the left or right.  He denies any vision changes, or headaches.  At this time, there is no confusion.  He denies any chest pain, or palpitations.  No syncope or presyncope.  At this time, there is no vertigo.  He denies any shortness of breath or cough.  He denies any nausea or vomiting.  No abdominal pain or diarrhea.  He denies any lower extremity swelling, or calf tenderness.  He is compliant with his medications, although his daughter reports the patient may be taking too much of this blood pressure meds.  No fever or chills.  No recent infections.  Of note, the patient continues to drive, which concerns his daughter.   ED Course:  BP (!) 157/80   Pulse 75   Temp 98.7 F (37.1 C) (Oral)   Resp 20   Ht 5' 10" (1.778 m)   Wt 70.3 kg (155 lb)   SpO2  98%   BMI 22.24 kg/m   Sodium 142, potassium 3.8, chloride 109, bicarb 25, glucose 115.  BUN 25, creatinine 1.8 (was 1.84 on 06/18/2017), GFR 31 Alkaline phosphatase 136, AST 13, ALT 11, third bilirubin is 1 Troponin 0 0.03 White count 8.3, hemoglobin 10.7, platelets 219 Urinalysis negative Last 2D echo in September 2016, EF 55 to 94%, grade 1 diastolic, normal systolic Cardiac catheterization 10/26/2014 Last CT of the head on 04/22/2017 had shownCT of the head shows:  1. Intracranial hemorrhage. Left frontal intraparenchymal hematoma volume of 1.9 cc. Scattered left subarachnoid hemorrhage in the frontal and temporal lobes. Subdural hematoma tracking along the left frontal region, bilateral tentorium and superior falx. 5 mm thickness extra-axial hemorrhage left temporal region likely also subdural, less likely epidural. There is 3 mm left-to-right midline Shift. But cleared by Neurosurgery  CT of the T and L spine  remarkable for acute fractures in the posterior left 8, 9, 10 and possibly 12 rib, superimposed numerous subacute and chronic free fractures, T2 vertebral body compression fracture and T1 spinous process fracture also seen in March, which are stable.,  And severe L3 compression fracture progressing since 2018, with other chronic fracture seen in prior films.  Review of Systems:  As per HPI otherwise all other systems reviewed and are negative  Past Medical History:  Diagnosis Date  . Anemia   . Blood dyscrasia    '  free bleeder' per patient   . Coronary artery disease    3 coronary stents- followed by Dr Loleta Chance   . Diabetes mellitus without complication ([place])    type II   . Essential hypertension 10/22/2014  . GERD (gastroesophageal reflux disease)   . Multiple abrasions    on arms due to stumbling over chair on 11/19/16 per patient     Past Surgical History:  Procedure Laterality Date  . CARDIAC CATHETERIZATION N/A 10/25/2014   Procedure: Left Heart Cath and Coronary  Angiography;  Surgeon: Burnell Blanks, MD;  Location: [facility];  Service: Cardiovascular;  Laterality: N/A;  . CARDIAC CATHETERIZATION N/A 10/26/2014   Procedure: Coronary Stent Intervention;  Surgeon: Burnell Blanks, MD;  Location: [facility];  Service: Cardiovascular;  Laterality: N/A;  . ESOPHAGOGASTRODUODENOSCOPY (EGD) WITH PROPOFOL N/A 11/25/2016   Procedure: ESOPHAGOGASTRODUODENOSCOPY (EGD) WITH PROPOFOL;  Surgeon: Laurence Spates, MD;  Location: [facility];  Service: Endoscopy;  Laterality: N/A;  . HELLER MYOTOMY  12/17/2011   Procedure: LAPAROSCOPIC HELLER MYOTOMY;  Surgeon: Pedro Earls, MD;  Location: [facility];  Service: General;  Laterality: N/A;  . [place]  . JOINT REPLACEMENT  2007   rt hip   . SAVORY DILATION N/A 11/25/2016   Procedure: SAVORY DILATION;  Surgeon: Laurence Spates, MD;  Location: [facility];  Service: Endoscopy;  Laterality: N/A;  . UPPER GI ENDOSCOPY  12/17/2011   Procedure: UPPER GI ENDOSCOPY;  Surgeon: Pedro Earls, MD;  Location: [facility];  Service: General;  Laterality: N/A;    Social History Social History   Socioeconomic History  . Marital status: Married    Spouse name: Not on file  . Number of children: Not on file  . Years of education: Not on file  . Highest education level: Not on file  Occupational History  . Not on file  Social Needs  . Financial resource strain: Not on file  . Food insecurity:    Worry: Not on file    Inability: Not on file  . Transportation needs:    Medical: Not on file    Non-medical: Not on file  Tobacco Use  . Smoking status: Former Research scientist (life sciences)  . Smokeless tobacco: Never Used  . Tobacco comment: stopped 68yr ago  Substance and Sexual Activity  . Alcohol use: No  . Drug use: No  . Sexual activity: Not on file  Lifestyle  . Physical activity:    Days per week: Not on file    Minutes per session: Not on file  . Stress: Not on file  Relationships  . Social  connections:    Talks on phone: Not on file    Gets together: Not on file    Attends religious service: Not on file    Active member of club or organization: Not on file    Attends meetings of clubs or organizations: Not on file    Relationship status: Not on file  . Intimate partner violence:    Fear of current or ex partner: Not on file    Emotionally abused: Not on file    Physically abused: Not on file    Forced sexual activity: Not on file  Other Topics Concern  . Not on file  Social History Narrative  . Not on file     No Known Allergies  Family History  Problem Relation Age of Onset  . Heart disease Mother   . Heart disease Father  Prior to Admission medications   Medication Sig Start Date End Date Taking? Authorizing Provider  acetaminophen (TYLENOL) 325 MG tablet Take 650 mg by mouth every 6 (six) hours as needed.    [provider]  amLODipine (NORVASC) 2.5 MG tablet Take 2.5 mg by mouth daily.     [provider]  atorvastatin (LIPITOR) 10 MG tablet Take 10 mg by mouth every evening. 02/21/17   [provider]  cephALEXin (KEFLEX) 500 MG capsule Take 500 mg by mouth 2 (two) times daily. 06/18/17   [provider]  docusate sodium (COLACE) 100 MG capsule Take 100 mg by mouth 2 (two) times daily as needed for mild constipation.    [provider]  ferrous sulfate 325 (65 FE) MG tablet Take 325 mg by mouth every Monday, Wednesday, and Friday.     [provider]  metFORMIN (GLUCOPHAGE-XR) 500 MG 24 hr tablet Take 1 tablet (500 mg total) by mouth every morning. Patient taking differently: Take 500 mg by mouth every evening.  10/29/14   Erlene Quan, PA-C  metoprolol tartrate (LOPRESSOR) 25 MG tablet Take 1 tablet (25 mg total) by mouth 2 (two) times daily. NEED OV. 06/12/17   Croitoru, Mihai, MD  Multiple Vitamin (MULTIVITAMIN) capsule Take 1 capsule by mouth daily.    [provider]  nitroGLYCERIN  (NITROSTAT) 0.4 MG SL tablet Place 0.4 mg under the tongue every 5 (five) minutes as needed for chest pain.    [provider]  omeprazole (PRILOSEC) 10 MG capsule Take 10 mg by mouth daily.    [provider]    Physical Exam:  Vitals:   07/03/17 0945 07/03/17 1000 07/03/17 1015 07/03/17 1030  BP: (!) 187/82 (!) 183/82 (!) 176/85 (!) 157/80  Pulse: 75 73 74 75  Resp: _0 Temp:      TempSrc:      SpO2: 100% 100% 99% 98%  Weight:      Height:       Constitutional: NAD, calm, comfortable Eyes: PERRL, lids and conjunctivae normal ENMT: Mucous membranes are dry, without exudate or lesions  Neck: normal, supple, no masses, no thyromegaly Respiratory: essentially clear to auscultation bilaterally, no wheezing, no crackles. Normal respiratory effort  Cardiovascular: Regular rate and rhythm, very soft  murmur, rubs or gallops. No extremity edema. 2+ pedal pulses. No carotid bruits.  Abdomen: Soft, non tender, No hepatosplenomegaly. Bowel sounds positive.  Musculoskeletal: no clubbing / cyanosis. Moves all extremities Skin: no jaundice, multiple areas of ecchymosis especially on the arms, old ecchymosis on the L inguinal area and pannus, and new bruise in the L hip  Neurologic: Sensation intact  Strength equal in all extremities Psychiatric:   Alert and oriented x 3. Normal mood.     Labs on Admission: I have personally reviewed following labs and imaging studies  CBC: Recent Labs  Lab 07/03/17 1001  WBC 8.3  NEUTROABS 5.6  HGB 10.7*  HCT 33.1*  MCV 90.7  PLT 101    Basic Metabolic Panel: Recent Labs  Lab 07/03/17 1001  NA 142  K 3.8  CL 109  CO2 25  GLUCOSE 115*  BUN 25*  CREATININE 1.80*  CALCIUM 9.2    GFR: Estimated Creatinine Clearance: 25.5 mL/min (A) (by C-G formula based on SCr of 1.8 mg/dL (H)).  Liver Function Tests: Recent Labs  Lab 07/03/17 1001  AST 13*  ALT 11*  ALKPHOS 136*  BILITOT 1.0  PROT 5.7*  ALBUMIN  3.2*    No results for input(s): LIPASE, AMYLASE in the last 168 hours. No results for input(s): AMMONIA in the last 168 hours.  Coagulation Profile: No results for input(s): INR, PROTIME in the last 168 hours.  Cardiac Enzymes: Recent Labs  Lab 07/03/17 1001  TROPONINI <0.03    BNP (last 3 results) No results for input(s): PROBNP in the last 8760 hours.  HbA1C: No results for input(s): HGBA1C in the last 72 hours.  CBG: No results for input(s): GLUCAP in the last 168 hours.  Lipid Profile: No results for input(s): CHOL, HDL, LDLCALC, TRIG, CHOLHDL, LDLDIRECT in the last 72 hours.  Thyroid Function Tests: No results for input(s): TSH, T4TOTAL, FREET4, T3FREE, THYROIDAB in the last 72 hours.  Anemia Panel: No results for input(s): VITAMINB12, FOLATE, FERRITIN, TIBC, IRON, RETICCTPCT in the last 72 hours.  Urine analysis:    Component Value Date/Time   COLORURINE YELLOW 07/03/2017 1035   APPEARANCEUR CLEAR 07/03/2017 1035   LABSPEC 1.018 07/03/2017 1035   PHURINE 6.0 07/03/2017 1035   GLUCOSEU NEGATIVE 07/03/2017 1035   HGBUR NEGATIVE 07/03/2017 [place] 07/03/2017 [place] 07/03/2017 1035   PROTEINUR NEGATIVE 07/03/2017 1035   NITRITE NEGATIVE 07/03/2017 1035   LEUKOCYTESUR NEGATIVE 07/03/2017 1035    Sepsis Labs: _0 (procalcitonin:4,lacticidven:4) )No results found for this or any previous visit (from the past 240 hour(s)).   Radiological Exams on Admission: Ct Head Wo Contrast  Result Date: 07/03/2017 CLINICAL DATA:  Status post fall, with laceration at the left side of the head. Neck pain. Initial encounter. EXAM: CT HEAD WITHOUT CONTRAST CT CERVICAL SPINE WITHOUT CONTRAST TECHNIQUE: Multidetector CT imaging of the head and cervical spine was performed following the standard protocol without intravenous contrast. Multiplanar CT image reconstructions of the cervical spine were also generated. COMPARISON:  CT of the head and  cervical spine performed 06/18/2017 FINDINGS: CT HEAD FINDINGS Brain: No evidence of acute infarction, hemorrhage, hydrocephalus, extra-axial collection or mass lesion / mass effect. Prominence of the ventricles and sulci reflects moderate cortical volume loss. Cerebellar atrophy is noted. Scattered periventricular and subcortical white matter change likely reflects small vessel ischemic microangiopathy. The brainstem and fourth ventricle are within normal limits. The basal ganglia are unremarkable in appearance. The cerebral hemispheres demonstrate grossly normal gray-white differentiation. No mass effect or midline shift is seen. Vascular: No hyperdense vessel or unexpected calcification. Skull: There is no evidence of fracture; visualized osseous structures are unremarkable in appearance. Sinuses/Orbits: The orbits are within normal limits. The paranasal sinuses and mastoid air cells are well-aerated. Other: No significant soft tissue abnormalities are seen. CT CERVICAL SPINE FINDINGS Alignment: There is mild grade 1 anterolisthesis of C7 on T1, reflecting underlying facet disease. Skull base and vertebrae: There is a mildly comminuted fracture of the posterior spinous process of T1. There is slight chronic loss of height at vertebral body T2. Soft tissues and spinal canal: No prevertebral fluid or swelling. No visible canal hematoma. Disc levels: Mild multilevel disc space narrowing is noted along the lower cervical spine, with scattered anterior and posterior disc osteophyte complexes. Upper chest: Minimal atelectasis is noted at the left lung apex. The visualized portions of the thyroid gland are grossly unremarkable. Mild calcification is seen at the carotid bifurcations bilaterally. Other: No additional soft tissue abnormalities are seen. IMPRESSION: 1. No evidence of traumatic intracranial injury. 2. Mildly comminuted fracture of the posterior spinous process of T1. 3. No evidence of acute fracture or  subluxation  along the cervical spine. 4. Moderate cortical volume loss and scattered small vessel ischemic microangiopathy. 5. Mild degenerative change along the lower cervical spine. 6. Mild calcification at the carotid bifurcations bilaterally. Carotid ultrasound could be considered for further evaluation, when and as deemed clinically appropriate. Electronically Signed   By: Garald Balding M.D.   On: 07/03/2017 07:01   Ct Cervical Spine Wo Contrast  Result Date: 07/03/2017 CLINICAL DATA:  Status post fall, with laceration at the left side of the head. Neck pain. Initial encounter. EXAM: CT HEAD WITHOUT CONTRAST CT CERVICAL SPINE WITHOUT CONTRAST TECHNIQUE: Multidetector CT imaging of the head and cervical spine was performed following the standard protocol without intravenous contrast. Multiplanar CT image reconstructions of the cervical spine were also generated. COMPARISON:  CT of the head and cervical spine performed 06/18/2017 FINDINGS: CT HEAD FINDINGS Brain: No evidence of acute infarction, hemorrhage, hydrocephalus, extra-axial collection or mass lesion / mass effect. Prominence of the ventricles and sulci reflects moderate cortical volume loss. Cerebellar atrophy is noted. Scattered periventricular and subcortical white matter change likely reflects small vessel ischemic microangiopathy. The brainstem and fourth ventricle are within normal limits. The basal ganglia are unremarkable in appearance. The cerebral hemispheres demonstrate grossly normal gray-white differentiation. No mass effect or midline shift is seen. Vascular: No hyperdense vessel or unexpected calcification. Skull: There is no evidence of fracture; visualized osseous structures are unremarkable in appearance. Sinuses/Orbits: The orbits are within normal limits. The paranasal sinuses and mastoid air cells are well-aerated. Other: No significant soft tissue abnormalities are seen. CT CERVICAL SPINE FINDINGS Alignment: There is mild grade  1 anterolisthesis of C7 on T1, reflecting underlying facet disease. Skull base and vertebrae: There is a mildly comminuted fracture of the posterior spinous process of T1. There is slight chronic loss of height at vertebral body T2. Soft tissues and spinal canal: No prevertebral fluid or swelling. No visible canal hematoma. Disc levels: Mild multilevel disc space narrowing is noted along the lower cervical spine, with scattered anterior and posterior disc osteophyte complexes. Upper chest: Minimal atelectasis is noted at the left lung apex. The visualized portions of the thyroid gland are grossly unremarkable. Mild calcification is seen at the carotid bifurcations bilaterally. Other: No additional soft tissue abnormalities are seen. IMPRESSION: 1. No evidence of traumatic intracranial injury. 2. Mildly comminuted fracture of the posterior spinous process of T1. 3. No evidence of acute fracture or subluxation along the cervical spine. 4. Moderate cortical volume loss and scattered small vessel ischemic microangiopathy. 5. Mild degenerative change along the lower cervical spine. 6. Mild calcification at the carotid bifurcations bilaterally. Carotid ultrasound could be considered for further evaluation, when and as deemed clinically appropriate. Electronically Signed   By: Garald Balding M.D.   On: 07/03/2017 07:01   Ct Thoracic Spine Wo Contrast  Result Date: 07/03/2017 CLINICAL DATA:  84 year old male status post fall while walking to the bathroom this morning at assisted living facility. Age indeterminate compression fractures seen on thoracic spine series 06/18/2017. EXAM: CT THORACIC AND LUMBAR SPINE WITHOUT CONTRAST TECHNIQUE: Multidetector CT imaging of the thoracic and lumbar spine was performed without contrast. Multiplanar CT image reconstructions were also generated. COMPARISON:  Cervical spine CT 0631 hours today, 06/18/2017, 04/22/2017. CT Abdomen and Pelvis 09/06/2016. chest radiographs 10/21/2014.  FINDINGS: CT THORACIC SPINE FINDINGS Segmentation: Normal. Alignment: Mildly exaggerated thoracic kyphosis, stable since 06/18/2017. Vertebrae: Osteopenia. A comminuted fracture of the T1 spinous process was 1st demonstrated on the cervical spine CT 06/18/2017, and occurred since  04/22/2017. There is some periosteal new bone formation (series 6, image 21). This is a stable injury. The other T1 vertebra components appear stable and intact. T2 mild-to-moderate compression fracture is also new since 04/22/2017 but stable since 06/18/2017. Moderate T4 compression fracture is stable since 06/18/2017, new or progressed since 2016. Mild T5 compression fracture is stable since 06/18/2017, new or progressed since 2016. The T6 through T10 levels appear intact. Severe T11 and moderate to severe T12 compression fractures are stable since 2018. No significant bony retropulsion. There is a subacute appearing fracture of the left 8 rib costovertebral junction with periosteal new bone formation. There is a nearby unhealed posterior left 8th rib fracture with mild displacement on series 6, image 92. Mix of acute and chronic posterior left 9th rib fractures, with the acute appearing fracture mildly displaced on series 6, image 109. Mix of chronic and subacute posterior bilateral 10th rib fractures. Possible superimposed acute left posterolateral 10th rib fracture partially visible on series 6, image 134. Mix of subacute and chronic appearing bilateral 11th and 12th rib fractures. Possible superimposed acute posterior left 12th rib fracture on series 6, image 157. Paraspinal and other soft tissues: Small layering left pleural effusion with confluent left lower lobe atelectasis. Mild dependent pulmonary atelectasis elsewhere. Moderate volume of aspirated or retained secretions in the trachea (series 6, image 63. No pericardial effusion. Calcified coronary artery and aortic atherosclerosis. The posterior thoracic paraspinal soft tissues  appear normal. Disc levels: Congenital capacious thoracic spinal canal. No significant thoracic spinal stenosis suspected. CT LUMBAR SPINE FINDINGS Segmentation: Normal. Alignment: Stable since 2018. Chronic mild retrolisthesis of L5 on S1 with relatively preserved lumbar lordosis. Vertebrae: Osteopenia. Moderate chronic L1 compression fracture is stable since 2018. Mild chronic L2 inferior endplate compression is stable. Severe L3 compression fracture has progressed since 2018, the superior endplate is now compressed and was intact on the 2018 CT. The L4 vertebral body remains intact. Stable mild L5 compression fracture. No significant bony retropulsion. The visible sacrum and SI joints appear intact. Paraspinal and other soft tissues: Aortoiliac calcified atherosclerosis. Chronic bilateral nephrolithiasis. Chronic left renal atrophy. Chronic cholelithiasis. Diverticulosis of the descending colon with no active inflammation visible. The posterior paraspinal soft tissues appear within normal limits. Disc levels: Chronic multifactorial mild to moderate lumbar spinal stenosis L2-L3 and L3-L4. Chronic severe multifactorial spinal stenosis at L4-L5. These appear not significantly changed since 2018. IMPRESSION: CT THORACIC SPINE IMPRESSION 1. Acute appearing fractures of the posterior left 8th, 9th, 10th, and possibly 12 ribs. Superimposed numerous subacute and chronic rib fractures. 2. Associated small layering pleural effusion or hemothorax. Left lower lobe atelectasis. 3. Moderate volume retained or aspirated secretions in the trachea. 4. T2 vertebral body compression fracture and T1 spinous process fracture occurred between 04/22/2017 and 06/18/2017, and are stable since the Cervical spine CT on 06/18/2017. 5. Compression fractures of T4 and T5 are stable since 06/18/2017 and probably more longstanding. 6. Chronic T11 and T12 compression fractures are stable since 2018. CT LUMBAR SPINE IMPRESSION 1. A severe L3  compression fracture has progressed since 2018 and is age indeterminate. 2. Chronic L1, L2, and L5 compression fractures are stable since 2018. 3. Chronic multilevel degenerative lumbar spinal stenosis has not significantly changed since 2018 and is worst at the L4-L5 level. 4.  Aortic Atherosclerosis (ICD10-I70.0). 5. Chronic left renal atrophy, nephrolithiasis, and cholelithiasis. Electronically Signed   By: Genevie Ann M.D.   On: 07/03/2017 08:48   Ct Lumbar Spine Wo Contrast  Result Date:  07/03/2017 CLINICAL DATA:  84 year old male status post fall while walking to the bathroom this morning at assisted living facility. Age indeterminate compression fractures seen on thoracic spine series 06/18/2017. EXAM: CT THORACIC AND LUMBAR SPINE WITHOUT CONTRAST TECHNIQUE: Multidetector CT imaging of the thoracic and lumbar spine was performed without contrast. Multiplanar CT image reconstructions were also generated. COMPARISON:  Cervical spine CT 0631 hours today, 06/18/2017, 04/22/2017. CT Abdomen and Pelvis 09/06/2016. chest radiographs 10/21/2014. FINDINGS: CT THORACIC SPINE FINDINGS Segmentation: Normal. Alignment: Mildly exaggerated thoracic kyphosis, stable since 06/18/2017. Vertebrae: Osteopenia. A comminuted fracture of the T1 spinous process was 1st demonstrated on the cervical spine CT 06/18/2017, and occurred since 04/22/2017. There is some periosteal new bone formation (series 6, image 21). This is a stable injury. The other T1 vertebra components appear stable and intact. T2 mild-to-moderate compression fracture is also new since 04/22/2017 but stable since 06/18/2017. Moderate T4 compression fracture is stable since 06/18/2017, new or progressed since 2016. Mild T5 compression fracture is stable since 06/18/2017, new or progressed since 2016. The T6 through T10 levels appear intact. Severe T11 and moderate to severe T12 compression fractures are stable since 2018. No significant bony retropulsion. There is a  subacute appearing fracture of the left 8 rib costovertebral junction with periosteal new bone formation. There is a nearby unhealed posterior left 8th rib fracture with mild displacement on series 6, image 92. Mix of acute and chronic posterior left 9th rib fractures, with the acute appearing fracture mildly displaced on series 6, image 109. Mix of chronic and subacute posterior bilateral 10th rib fractures. Possible superimposed acute left posterolateral 10th rib fracture partially visible on series 6, image 134. Mix of subacute and chronic appearing bilateral 11th and 12th rib fractures. Possible superimposed acute posterior left 12th rib fracture on series 6, image 157. Paraspinal and other soft tissues: Small layering left pleural effusion with confluent left lower lobe atelectasis. Mild dependent pulmonary atelectasis elsewhere. Moderate volume of aspirated or retained secretions in the trachea (series 6, image 63. No pericardial effusion. Calcified coronary artery and aortic atherosclerosis. The posterior thoracic paraspinal soft tissues appear normal. Disc levels: Congenital capacious thoracic spinal canal. No significant thoracic spinal stenosis suspected. CT LUMBAR SPINE FINDINGS Segmentation: Normal. Alignment: Stable since 2018. Chronic mild retrolisthesis of L5 on S1 with relatively preserved lumbar lordosis. Vertebrae: Osteopenia. Moderate chronic L1 compression fracture is stable since 2018. Mild chronic L2 inferior endplate compression is stable. Severe L3 compression fracture has progressed since 2018, the superior endplate is now compressed and was intact on the 2018 CT. The L4 vertebral body remains intact. Stable mild L5 compression fracture. No significant bony retropulsion. The visible sacrum and SI joints appear intact. Paraspinal and other soft tissues: Aortoiliac calcified atherosclerosis. Chronic bilateral nephrolithiasis. Chronic left renal atrophy. Chronic cholelithiasis. Diverticulosis  of the descending colon with no active inflammation visible. The posterior paraspinal soft tissues appear within normal limits. Disc levels: Chronic multifactorial mild to moderate lumbar spinal stenosis L2-L3 and L3-L4. Chronic severe multifactorial spinal stenosis at L4-L5. These appear not significantly changed since 2018. IMPRESSION: CT THORACIC SPINE IMPRESSION 1. Acute appearing fractures of the posterior left 8th, 9th, 10th, and possibly 12 ribs. Superimposed numerous subacute and chronic rib fractures. 2. Associated small layering pleural effusion or hemothorax. Left lower lobe atelectasis. 3. Moderate volume retained or aspirated secretions in the trachea. 4. T2 vertebral body compression fracture and T1 spinous process fracture occurred between 04/22/2017 and 06/18/2017, and are stable since the Cervical spine CT on  06/18/2017. 5. Compression fractures of T4 and T5 are stable since 06/18/2017 and probably more longstanding. 6. Chronic T11 and T12 compression fractures are stable since 2018. CT LUMBAR SPINE IMPRESSION 1. A severe L3 compression fracture has progressed since 2018 and is age indeterminate. 2. Chronic L1, L2, and L5 compression fractures are stable since 2018. 3. Chronic multilevel degenerative lumbar spinal stenosis has not significantly changed since 2018 and is worst at the L4-L5 level. 4.  Aortic Atherosclerosis (ICD10-I70.0). 5. Chronic left renal atrophy, nephrolithiasis, and cholelithiasis. Electronically Signed   By: Genevie Ann M.D.   On: 07/03/2017 08:48    EKG: Independently reviewed.  Assessment/Plan Principal Problem:   Falls Active Problems:   Lap Heller Myotomy Oct 2013   Essential hypertension   Chronic renal disease, stage III (HCC)   DJD (degenerative joint disease), thoracic   GERD (gastroesophageal reflux disease)   Diabetes (HCC)   CAD -S/P LAD and CFX DES 10/26/14   Dyslipidemia   Traumatic intracerebral hemorrhage (HCC)   Anemia   New onset of falls, in a  patient with recent, frequent falls, in the setting of deconditioning, history of carotid artery disease, and orthostatic hypotension.CT of the T and L spine  remarkable for acute fractures in the posterior left 8, 9, 10 and possibly 12 rib, superimposed numerous subacute and chronic free fractures, T2 vertebral body compression fracture and T1 spinous process fracture also seen in March, which are stable.,  And severe L3 compression fracture progressing since 2018, with other chronic fracture seen in prior films CT of the head and C-spine negative for acute intracranial abnormalities..   No recent infections.  No vertigo or dizziness at rest.  He was found to be orthostatic on presentation.  Afebrile, UA negative.  CBC and be met essentially unremarkable.   Troponin negative Telemetry, observation Carotid ultrasound, rule out further stenosis PT/OT and evaluate for Epley for BPV Zofran PRN IVF at 100 cc an hour Hold BP medications at this time, will need adjustment (see below) Fall precautions   CAD, tn neg Last 2D echo in September 2016, EF 55 to 03%, grade 1 diastolic, normal systolic Cardiac catheterization 10/26/2014 Continue to monitor, no ASA due to high risk of bleeding and easy bruising  Resume BB when clinically stable    Chronic kidney disease stage 3  Cr at baseline at 1.8  Lab Results  Component Value Date   CREATININE 1.80 (H) 07/03/2017   CREATININE 1.84 (H) 06/18/2017   CREATININE 1.98 (H) 04/23/2017  IVF Repeat BMET in am  Hold NSAIDS  Hypertension BP  157/80   Pulse 75   Patient was noted to be orthostatic at the ED . Goals and targets for the ederly by NCBI  > 6 yr old with DM and CKD   140/90  Hold  home anti-hypertensive medications at this time. May need to be at higher baseline : Hold BP  Med if less than 474 systolic   Hyperlipidemia Continue home statins   Type II Diabetes Current blood sugar level is 115 Lab Results  Component Value Date   HGBA1C 6.1 (H)  10/24/2014   Hgb A1C Hold home oral diabetic medications.   SSI  GERD, no acute symptoms Continue PPI  Anemia of chronic disease Hemoglobin on admission 10.7  Repeat CBC in am  No transfusion is indicated at this time    DVT prophylaxis:  SCD  Code Status:    Full Family Communication:  Discussed with patient and family  Disposition Plan: Expect patient to be discharged to home after condition improves Consults called:    None  Admission status: Tele IP    Sharene Butters, PA-C [facility]   Amion text  [phone]   07/03/2017, 12:56 PM

## 2017-07-03 NOTE — ED Notes (Signed)
Pt reports he fell around 0400 this date and could not get up. Pt advised he did not black out and only hurts at the top of his head.

## 2017-07-03 NOTE — ED Provider Notes (Signed)
Medical screening examination/treatment/procedure(s) were conducted as a shared visit with non-physician practitioner(s) and myself.  I personally evaluated the patient during the encounter.  [place] nickel sized denuded are to the crown of the head PERRL No hemotympanum of either ear C collar in place, trachea is midline RRR CTAB NABS, soft Pelvis is stable  Disposition following CT scans   Verenise Moulin, MD 07/03/17 1610

## 2017-07-03 NOTE — ED Provider Notes (Signed)
84 year old male with a history of CKD stage III, HTN, Dm Type II, CAD s/p LAD and CFX DES 9/16, and traumatic intracranial hemorrhage who presents to the emergency department with a chief complaint of fall.  The patient is somewhat confused about the details of the fall. He reports he awoke in the middle of the night to go to the restroom. He grabbed onto a wire Baker's rack as his walker was across the room, but he fell before he was able to grab the rack.  The patient also endorses a history of chronic dizziness that has been gradually worsening over the last few days.  The patient's daughter also feels that his dizziness has significantly worsened over the last few weeks.  She reports that he was seen in the emergency department on May 1 after a fall.  He was admitted on March 5 after he had a subdural hematoma and a scattered left subarachnoid hemorrhages in the frontal and temporal lobes.  He states that turning his head to the left or to the right brings on his dizziness.  He has not been eating and drinking well over the last few days.  He has no other complaints at this time.  He lives at home with his wife.  He ambulates at baseline with a walker.  His wife also ambulates at baseline with a walker.  His daughter lives locally.  The patient still drives, which concerns his daughter.  Physical Exam  BP (!) 157/80   Pulse 75   Temp 98.7 F (37.1 C) (Oral)   Resp 20   Ht  (1.778 m)   Wt 70.3 kg (155 lb)   SpO2 98%   BMI 22.24 kg/m   Physical Exam  Minimally tender to palpation diffusely throughout the thoracic and lumbar spine.  No paraspinal muscle tenderness bilaterally.  Cervical spine and surrounding bilateral paraspinal muscles are nontender to palpation.  There is extensive bruising to the patient's left back and left lower abdomen.  This appears old.  There is a superficial abrasion to the left lateral hip, which appears new.  No tenderness to the bilateral hips or  pelvis.  Abdomen is soft, nontender, nondistended.  Patient is ambulatory with a walker.  Gait is very ataxic.  He is very unsteady on his feet. Cranial nerves II through XII are grossly intact.  Good strength against resistance of the upper and lower extremities bilaterally.  Sensation is intact throughout.  Able to bear weight on the bilateral lower extremities.  Condom catheter is in place and draining yellow urine.  ED Course/Procedures     Procedures  MDM   84 year old male with a history of CKD stage III, HTN, Dm Type II, CAD s/p LAD and CFX DES 9/16, and traumatic intracranial hemorrhage who presents to the emergency department with a chief complaint of fall.   Patient is somewhat unsure about the details, but it sounds as if he was getting out of bed to go to the bathroom when he became unsteady on his feet.  He attempted to grab hold of the Baker's rack to get to his walker across the room, but fell into the floor instead.  The patient was seen and evaluated by Dr. Terressa Koyanagi, attending physician.  Head CT is negative.  Mildly comminuted fracture of the posterior spinous process of T1 and mild to moderate compression fracture of T2, which are acute.  There also appear to be acute fractures of the posterior eighth, ninth, 10th, and possibly  12th ribs.  There is also what appears to be a small pleural effusion possibly hemothorax.  He has no dyspnea or chest pain.  Multiple other chronic fractures are seen to the spine and x-ray from previous falls.  Daughter is concerned about more frequent recurring falls.  He was admitted in March 2019 with a traumatic intracranial hemorrhage after a fall.  He states that he feels dizzy that is worse with standing and when he turns his head to the left or right.  Carotid ultrasound was performed in 2016 that showed 1 to 39% stenosis ICA bilaterally.  He also has positive orthostatic vital signs.  500 cc IV fluid bolus given.  This may be secondary  to decreased p.o. intake over the last few days. On exam, he has no neurologic deficits.  Patient has required minimal pain control since arrival in the ED.  The patient's daughter is concerned since the patient lives at home with his wife who also uses a walker.  Spoke with PA [facility] with the hospitalist team who will admit the patient. The patient appears reasonably stabilized for admission considering the current resources, flow, and capabilities available in the ED at this time, and I doubt any other [facility] requiring further screening and/or treatment in the ED prior to admission.       Barkley Boards, PA-C 07/03/17 1156    Palumbo, April, MD 07/03/17 2319

## 2017-07-03 NOTE — ED Triage Notes (Signed)
  Patient lives at independent/assisted living facility.  Went to use this bathroom around 0400 and fell hitting his head.  Has small laceration on top of head.  No LOC. C-collar in place.  Patient did ambulate to stretcher for EMS.

## 2017-07-04 ENCOUNTER — Inpatient Hospital Stay (HOSPITAL_COMMUNITY): Payer: Medicare Other

## 2017-07-04 DIAGNOSIS — N183 Chronic kidney disease, stage 3 (moderate): Secondary | ICD-10-CM

## 2017-07-04 DIAGNOSIS — Z9861 Coronary angioplasty status: Secondary | ICD-10-CM

## 2017-07-04 DIAGNOSIS — W19XXXD Unspecified fall, subsequent encounter: Secondary | ICD-10-CM

## 2017-07-04 DIAGNOSIS — S2242XD Multiple fractures of ribs, left side, subsequent encounter for fracture with routine healing: Secondary | ICD-10-CM

## 2017-07-04 DIAGNOSIS — I2510 Atherosclerotic heart disease of native coronary artery without angina pectoris: Secondary | ICD-10-CM

## 2017-07-04 DIAGNOSIS — K219 Gastro-esophageal reflux disease without esophagitis: Secondary | ICD-10-CM

## 2017-07-04 DIAGNOSIS — S2242XG Multiple fractures of ribs, left side, subsequent encounter for fracture with delayed healing: Secondary | ICD-10-CM

## 2017-07-04 DIAGNOSIS — E1121 Type 2 diabetes mellitus with diabetic nephropathy: Secondary | ICD-10-CM

## 2017-07-04 DIAGNOSIS — G909 Disorder of the autonomic nervous system, unspecified: Secondary | ICD-10-CM

## 2017-07-04 DIAGNOSIS — D631 Anemia in chronic kidney disease: Secondary | ICD-10-CM

## 2017-07-04 DIAGNOSIS — E785 Hyperlipidemia, unspecified: Secondary | ICD-10-CM

## 2017-07-04 DIAGNOSIS — S06360A Traumatic hemorrhage of cerebrum, unspecified, without loss of consciousness, initial encounter: Secondary | ICD-10-CM

## 2017-07-04 DIAGNOSIS — I10 Essential (primary) hypertension: Secondary | ICD-10-CM

## 2017-07-04 DIAGNOSIS — R42 Dizziness and giddiness: Secondary | ICD-10-CM

## 2017-07-04 LAB — BASIC METABOLIC PANEL
Anion gap: 6 (ref 5–15)
BUN: 21 mg/dL — AB (ref 6–20)
CALCIUM: 8.7 mg/dL — AB (ref 8.9–10.3)
CO2: 24 mmol/L (ref 22–32)
CREATININE: 1.69 mg/dL — AB (ref 0.61–1.24)
Chloride: 110 mmol/L (ref 101–111)
GFR calc non Af Amer: 33 mL/min — ABNORMAL LOW (ref >60)
GFR, EST AFRICAN AMERICAN: 38 mL/min — AB (ref >60)
Glucose, Bld: 115 mg/dL — ABNORMAL HIGH (ref 65–99)
Potassium: 3.7 mmol/L (ref 3.5–5.1)
SODIUM: 140 mmol/L (ref 135–145)

## 2017-07-04 LAB — ECHOCARDIOGRAM COMPLETE
HEIGHTINCHES: 70 in
Weight: 2528 oz

## 2017-07-04 LAB — CBC
HCT: 32 % — ABNORMAL LOW (ref 39.0–52.0)
Hemoglobin: 10.2 g/dL — ABNORMAL LOW (ref 13.0–17.0)
MCH: 28.6 pg (ref 26.0–34.0)
MCHC: 31.9 g/dL (ref 30.0–36.0)
MCV: 89.6 fL (ref 78.0–100.0)
Platelets: 213 10*3/uL (ref 150–400)
RBC: 3.57 MIL/uL — AB (ref 4.22–5.81)
RDW: 13.9 % (ref 11.5–15.5)
WBC: 7.7 10*3/uL (ref 4.0–10.5)

## 2017-07-04 MED ORDER — LIDOCAINE 5 % EX PTCH
1.0000 | MEDICATED_PATCH | CUTANEOUS | Status: DC
  Administered 2017-07-04 – 2017-07-05 (×2): 1 via TRANSDERMAL
  Filled 2017-07-04 (×2): qty 1

## 2017-07-04 NOTE — Progress Notes (Signed)
PROGRESS NOTE    [name]  ZOX:[id number] DOB: 1922-10-21 DOA: 07/03/2017 PCP: Merlene Laughter, MD   Brief Narrative:  84 year old with past medical history of essential hypertension, hyperlipidemia, CKD stage III, COPD, CAD status post stent placement in September 2016, hyperlipidemia, diabetes mellitus type 2, GERD, intracranial hemorrhage in March 2019 was admitted to the hospital with complains of dizziness.  Upon admission patient reported a fall, dizziness with change in position and decreased p.o. intake.  He states every time he turns his head or gets up from seated position he feels quite dizzy causing him to fall.  CT of the head was negative for any acute traumatic intracranial injury but showed mildly comminuted fracture of the posterior spinous process of T1, moderate cortical volume loss.  CT of the lumbar and thoracic spine showed acute fractures of the left eighth, ninth, 10th and possibly 12th rib.  Associated with small pleural effusion or hemothorax.  Compression fracture of T4/T5 which had been present previously, chronic T11 and T12 compression fracture.  Severe L3, chronic L1, L2 and L5 compression fracture which have all been present previously.  Patient was admitted to the hospital for further work-up   Assessment & Plan:   Principal Problem:   Falls Active Problems:   Lap Heller Myotomy Oct 2013   Essential hypertension   Chronic renal disease, stage III (HCC)   DJD (degenerative joint disease), thoracic   GERD (gastroesophageal reflux disease)   Diabetes (HCC)   CAD -S/P LAD and CFX DES 10/26/14   Dyslipidemia   Traumatic intracerebral hemorrhage (HCC)   Anemia  Multiple traumatic falls-causing multiple rib fractures Dizziness-autonomic dysfunction Chronic thoracic and lumbar compression fractures -I highly suspect patient has underlying autonomic dysfunction on top of mild dehydration - Carotid ultrasound done-results pending -We will order  echocardiogram - Patient is orthostatic positive.  Will place compression stockings -She is getting IV fluids at 100 cc/h.  Monitor urine output and any signs of fluid overload -She will need outpatient vestibular neuro rehab.  Physical therapy recommends PT at home -Provide supportive care -I asked him to use basic caution such as avoiding sudden change in position, keeping his home well, use guardrails and something for support as ambulating etc -Pain control-we will order lidocaine patch.  Patient is hesitant to use narcotics but I have offered him in case if necessary for severe pain - Incentive spirometry  Coronary artery disease - On metoprolol 25 mg twice daily which is on hold.  Chronic kidney disease stage III -Creatinine appears to be stable around baseline of 1.6  Essential hypertension -On home he is on Norvasc 2.5 mg daily.  I would be reluctant to start but at the time of discharge  Hyperlipidemia -On statin  Diabetes mellitus type 2 -Accu-Chek insulin sliding scale  GERD -Continue PPI  Anemia of chronic disease -Continue iron supplements  DVT prophylaxis: SCds Code Status: Full Code  Family Communication:   Wife at Bedside  Disposition Plan: Maintain inpatient stay for another day.   Consultants:   None    Antimicrobials:   None    Subjective: No complaints besides his rib pain upon movement.   Review of Systems Otherwise negative except as per HPI, including: HEENT/EYES = negative for pain, redness, loss of vision, double vision, blurred vision, loss of hearing, sore throat, hoarseness, dysphagia Cardiovascular= negative for chest pain, palpitation, murmurs, lower extremity swelling Respiratory/lungs= negative for shortness of breath, cough, hemoptysis, wheezing, mucus production Gastrointestinal= negative for nausea, vomiting,,  abdominal pain, melena, hematemesis Genitourinary= negative for Dysuria, Hematuria, Change in Urinary Frequency MSK =  Negative for arthralgia, myalgias, Back Pain, Joint swelling  Neurology= Negative for headache, seizures, numbness, tingling  Psychiatry= Negative for anxiety, depression, suicidal and homocidal ideation Allergy/Immunology= Medication/Food allergy as listed  Skin= Negative for Rash, lesions, ulcers, itching   Objective: Vitals:   07/03/17 2128 07/04/17 0100 07/04/17 0507 07/04/17 1228  BP: (!) 170/79  (!) 178/85 (!) 142/71  Pulse: 70  79 83  Resp: Temp: 97.9 F (36.6 C)  97.7 F (36.5 C) 98.2 F (36.8 C)  TempSrc: Oral  Oral Oral  SpO2: 97%  97% 99%  Weight:  71.7 kg (158 lb)    Height:        Intake/Output Summary (Last 24 hours) at 07/04/2017 1343 Last data filed at 07/04/2017 0900 Gross per 24 hour  Intake 2240 ml  Output 950 ml  Net 1290 ml   Filed Weights   07/03/17 0523 07/03/17 1740 07/04/17 0100  Weight: 70.3 kg (155 lb) 65.3 kg (144 lb) 71.7 kg (158 lb)    Examination: General exam: Appears calm and comfortable ; elderly frail appearing Respiratory system: Clear to auscultation. Respiratory effort normal. Cardiovascular system: S1 & S2 heard, RRR. No JVD, murmurs, rubs, gallops or clicks. No pedal edema. Gastrointestinal system: Abdomen is nondistended, soft and nontender. No organomegaly or masses felt. Normal bowel sounds heard. Central nervous system: Alert and oriented. No focal neurological deficits. Extremities: Symmetric 4 x 5 power. Skin: multiple new and old ecchymosis on his arms. Area of forehead laceration with dressing in place.  Psychiatry: Judgement and insight appear normal. Mood & affect appropriate.  MSK: Tenderness to palpation around his fx rib area.    Data Reviewed:   CBC: Recent Labs  Lab 07/03/17 1001 07/04/17 0516  WBC 8.3 7.7  NEUTROABS 5.6  --   HGB 10.7* 10.2*  HCT 33.1* 32.0*  MCV 90.7 89.6  PLT 219 213   Basic Metabolic Panel: Recent Labs  Lab 07/03/17 1001 07/04/17 0516  NA 142 140  K 3.8 3.7  CL 109  110  CO2 25 24  GLUCOSE 115* 115*  BUN 25* 21*  CREATININE 1.80* 1.69*  CALCIUM 9.2 8.7*   GFR: Estimated Creatinine Clearance: 27.7 mL/min (A) (by C-G formula based on SCr of 1.69 mg/dL (H)). Liver Function Tests: Recent Labs  Lab 07/03/17 1001  AST 13*  ALT 11*  ALKPHOS 136*  BILITOT 1.0  PROT 5.7*  ALBUMIN 3.2*   No results for input(s): LIPASE, AMYLASE in the last 168 hours. No results for input(s): AMMONIA in the last 168 hours. Coagulation Profile: No results for input(s): INR, PROTIME in the last 168 hours. Cardiac Enzymes: Recent Labs  Lab 07/03/17 1001  TROPONINI <0.03   BNP (last 3 results) No results for input(s): PROBNP in the last 8760 hours. HbA1C: No results for input(s): HGBA1C in the last 72 hours. CBG: No results for input(s): GLUCAP in the last 168 hours. Lipid Profile: No results for input(s): CHOL, HDL, LDLCALC, TRIG, CHOLHDL, LDLDIRECT in the last 72 hours. Thyroid Function Tests: No results for input(s): TSH, T4TOTAL, FREET4, T3FREE, THYROIDAB in the last 72 hours. Anemia Panel: No results for input(s): VITAMINB12, FOLATE, FERRITIN, TIBC, IRON, RETICCTPCT in the last 72 hours. Sepsis Labs: No results for input(s): PROCALCITON, LATICACIDVEN in the last 168 hours.  No results found for this or any previous visit (from the past 240 hour(s)).  Radiology Studies: Ct Head Wo Contrast  Result Date: 07/03/2017 CLINICAL DATA:  Status post fall, with laceration at the left side of the head. Neck pain. Initial encounter. EXAM: CT HEAD WITHOUT CONTRAST CT CERVICAL SPINE WITHOUT CONTRAST TECHNIQUE: Multidetector CT imaging of the head and cervical spine was performed following the standard protocol without intravenous contrast. Multiplanar CT image reconstructions of the cervical spine were also generated. COMPARISON:  CT of the head and cervical spine performed 06/18/2017 FINDINGS: CT HEAD FINDINGS Brain: No evidence of acute infarction,  hemorrhage, hydrocephalus, extra-axial collection or mass lesion / mass effect. Prominence of the ventricles and sulci reflects moderate cortical volume loss. Cerebellar atrophy is noted. Scattered periventricular and subcortical white matter change likely reflects small vessel ischemic microangiopathy. The brainstem and fourth ventricle are within normal limits. The basal ganglia are unremarkable in appearance. The cerebral hemispheres demonstrate grossly normal gray-white differentiation. No mass effect or midline shift is seen. Vascular: No hyperdense vessel or unexpected calcification. Skull: There is no evidence of fracture; visualized osseous structures are unremarkable in appearance. Sinuses/Orbits: The orbits are within normal limits. The paranasal sinuses and mastoid air cells are well-aerated. Other: No significant soft tissue abnormalities are seen. CT CERVICAL SPINE FINDINGS Alignment: There is mild grade 1 anterolisthesis of C7 on T1, reflecting underlying facet disease. Skull base and vertebrae: There is a mildly comminuted fracture of the posterior spinous process of T1. There is slight chronic loss of height at vertebral body T2. Soft tissues and spinal canal: No prevertebral fluid or swelling. No visible canal hematoma. Disc levels: Mild multilevel disc space narrowing is noted along the lower cervical spine, with scattered anterior and posterior disc osteophyte complexes. Upper chest: Minimal atelectasis is noted at the left lung apex. The visualized portions of the thyroid gland are grossly unremarkable. Mild calcification is seen at the carotid bifurcations bilaterally. Other: No additional soft tissue abnormalities are seen. IMPRESSION: 1. No evidence of traumatic intracranial injury. 2. Mildly comminuted fracture of the posterior spinous process of T1. 3. No evidence of acute fracture or subluxation along the cervical spine. 4. Moderate cortical volume loss and scattered small vessel ischemic  microangiopathy. 5. Mild degenerative change along the lower cervical spine. 6. Mild calcification at the carotid bifurcations bilaterally. Carotid ultrasound could be considered for further evaluation, when and as deemed clinically appropriate. Electronically Signed   By: Roanna Raider M.D.   On: 07/03/2017 07:01   Ct Cervical Spine Wo Contrast  Result Date: 07/03/2017 CLINICAL DATA:  Status post fall, with laceration at the left side of the head. Neck pain. Initial encounter. EXAM: CT HEAD WITHOUT CONTRAST CT CERVICAL SPINE WITHOUT CONTRAST TECHNIQUE: Multidetector CT imaging of the head and cervical spine was performed following the standard protocol without intravenous contrast. Multiplanar CT image reconstructions of the cervical spine were also generated. COMPARISON:  CT of the head and cervical spine performed 06/18/2017 FINDINGS: CT HEAD FINDINGS Brain: No evidence of acute infarction, hemorrhage, hydrocephalus, extra-axial collection or mass lesion / mass effect. Prominence of the ventricles and sulci reflects moderate cortical volume loss. Cerebellar atrophy is noted. Scattered periventricular and subcortical white matter change likely reflects small vessel ischemic microangiopathy. The brainstem and fourth ventricle are within normal limits. The basal ganglia are unremarkable in appearance. The cerebral hemispheres demonstrate grossly normal gray-white differentiation. No mass effect or midline shift is seen. Vascular: No hyperdense vessel or unexpected calcification. Skull: There is no evidence of fracture; visualized osseous structures are unremarkable in appearance. Sinuses/Orbits: The orbits  are within normal limits. The paranasal sinuses and mastoid air cells are well-aerated. Other: No significant soft tissue abnormalities are seen. CT CERVICAL SPINE FINDINGS Alignment: There is mild grade 1 anterolisthesis of C7 on T1, reflecting underlying facet disease. Skull base and vertebrae: There is a  mildly comminuted fracture of the posterior spinous process of T1. There is slight chronic loss of height at vertebral body T2. Soft tissues and spinal canal: No prevertebral fluid or swelling. No visible canal hematoma. Disc levels: Mild multilevel disc space narrowing is noted along the lower cervical spine, with scattered anterior and posterior disc osteophyte complexes. Upper chest: Minimal atelectasis is noted at the left lung apex. The visualized portions of the thyroid gland are grossly unremarkable. Mild calcification is seen at the carotid bifurcations bilaterally. Other: No additional soft tissue abnormalities are seen. IMPRESSION: 1. No evidence of traumatic intracranial injury. 2. Mildly comminuted fracture of the posterior spinous process of T1. 3. No evidence of acute fracture or subluxation along the cervical spine. 4. Moderate cortical volume loss and scattered small vessel ischemic microangiopathy. 5. Mild degenerative change along the lower cervical spine. 6. Mild calcification at the carotid bifurcations bilaterally. Carotid ultrasound could be considered for further evaluation, when and as deemed clinically appropriate. Electronically Signed   By: Roanna Raider M.D.   On: 07/03/2017 07:01   Ct Thoracic Spine Wo Contrast  Result Date: 07/03/2017 CLINICAL DATA:  84 year old male status post fall while walking to the bathroom this morning at assisted living facility. Age indeterminate compression fractures seen on thoracic spine series 06/18/2017. EXAM: CT THORACIC AND LUMBAR SPINE WITHOUT CONTRAST TECHNIQUE: Multidetector CT imaging of the thoracic and lumbar spine was performed without contrast. Multiplanar CT image reconstructions were also generated. COMPARISON:  Cervical spine CT 0631 hours today, 06/18/2017, 04/22/2017. CT Abdomen and Pelvis 09/06/2016. chest radiographs 10/21/2014. FINDINGS: CT THORACIC SPINE FINDINGS Segmentation: Normal. Alignment: Mildly exaggerated thoracic kyphosis,  stable since 06/18/2017. Vertebrae: Osteopenia. A comminuted fracture of the T1 spinous process was 1st demonstrated on the cervical spine CT 06/18/2017, and occurred since 04/22/2017. There is some periosteal new bone formation (series 6, image 21). This is a stable injury. The other T1 vertebra components appear stable and intact. T2 mild-to-moderate compression fracture is also new since 04/22/2017 but stable since 06/18/2017. Moderate T4 compression fracture is stable since 06/18/2017, new or progressed since 2016. Mild T5 compression fracture is stable since 06/18/2017, new or progressed since 2016. The T6 through T10 levels appear intact. Severe T11 and moderate to severe T12 compression fractures are stable since 2018. No significant bony retropulsion. There is a subacute appearing fracture of the left 8 rib costovertebral junction with periosteal new bone formation. There is a nearby unhealed posterior left 8th rib fracture with mild displacement on series 6, image 92. Mix of acute and chronic posterior left 9th rib fractures, with the acute appearing fracture mildly displaced on series 6, image 109. Mix of chronic and subacute posterior bilateral 10th rib fractures. Possible superimposed acute left posterolateral 10th rib fracture partially visible on series 6, image 134. Mix of subacute and chronic appearing bilateral 11th and 12th rib fractures. Possible superimposed acute posterior left 12th rib fracture on series 6, image 157. Paraspinal and other soft tissues: Small layering left pleural effusion with confluent left lower lobe atelectasis. Mild dependent pulmonary atelectasis elsewhere. Moderate volume of aspirated or retained secretions in the trachea (series 6, image 63. No pericardial effusion. Calcified coronary artery and aortic atherosclerosis. The posterior thoracic paraspinal  soft tissues appear normal. Disc levels: Congenital capacious thoracic spinal canal. No significant thoracic spinal  stenosis suspected. CT LUMBAR SPINE FINDINGS Segmentation: Normal. Alignment: Stable since 2018. Chronic mild retrolisthesis of L5 on S1 with relatively preserved lumbar lordosis. Vertebrae: Osteopenia. Moderate chronic L1 compression fracture is stable since 2018. Mild chronic L2 inferior endplate compression is stable. Severe L3 compression fracture has progressed since 2018, the superior endplate is now compressed and was intact on the 2018 CT. The L4 vertebral body remains intact. Stable mild L5 compression fracture. No significant bony retropulsion. The visible sacrum and SI joints appear intact. Paraspinal and other soft tissues: Aortoiliac calcified atherosclerosis. Chronic bilateral nephrolithiasis. Chronic left renal atrophy. Chronic cholelithiasis. Diverticulosis of the descending colon with no active inflammation visible. The posterior paraspinal soft tissues appear within normal limits. Disc levels: Chronic multifactorial mild to moderate lumbar spinal stenosis L2-L3 and L3-L4. Chronic severe multifactorial spinal stenosis at L4-L5. These appear not significantly changed since 2018. IMPRESSION: CT THORACIC SPINE IMPRESSION 1. Acute appearing fractures of the posterior left 8th, 9th, 10th, and possibly 12 ribs. Superimposed numerous subacute and chronic rib fractures. 2. Associated small layering pleural effusion or hemothorax. Left lower lobe atelectasis. 3. Moderate volume retained or aspirated secretions in the trachea. 4. T2 vertebral body compression fracture and T1 spinous process fracture occurred between 04/22/2017 and 06/18/2017, and are stable since the Cervical spine CT on 06/18/2017. 5. Compression fractures of T4 and T5 are stable since 06/18/2017 and probably more longstanding. 6. Chronic T11 and T12 compression fractures are stable since 2018. CT LUMBAR SPINE IMPRESSION 1. A severe L3 compression fracture has progressed since 2018 and is age indeterminate. 2. Chronic L1, L2, and L5  compression fractures are stable since 2018. 3. Chronic multilevel degenerative lumbar spinal stenosis has not significantly changed since 2018 and is worst at the L4-L5 level. 4.  Aortic Atherosclerosis (ICD10-I70.0). 5. Chronic left renal atrophy, nephrolithiasis, and cholelithiasis. Electronically Signed   By: Odessa Fleming M.D.   On: 07/03/2017 08:48   Ct Lumbar Spine Wo Contrast  Result Date: 07/03/2017 CLINICAL DATA:  84 year old male status post fall while walking to the bathroom this morning at assisted living facility. Age indeterminate compression fractures seen on thoracic spine series 06/18/2017. EXAM: CT THORACIC AND LUMBAR SPINE WITHOUT CONTRAST TECHNIQUE: Multidetector CT imaging of the thoracic and lumbar spine was performed without contrast. Multiplanar CT image reconstructions were also generated. COMPARISON:  Cervical spine CT 0631 hours today, 06/18/2017, 04/22/2017. CT Abdomen and Pelvis 09/06/2016. chest radiographs 10/21/2014. FINDINGS: CT THORACIC SPINE FINDINGS Segmentation: Normal. Alignment: Mildly exaggerated thoracic kyphosis, stable since 06/18/2017. Vertebrae: Osteopenia. A comminuted fracture of the T1 spinous process was 1st demonstrated on the cervical spine CT 06/18/2017, and occurred since 04/22/2017. There is some periosteal new bone formation (series 6, image 21). This is a stable injury. The other T1 vertebra components appear stable and intact. T2 mild-to-moderate compression fracture is also new since 04/22/2017 but stable since 06/18/2017. Moderate T4 compression fracture is stable since 06/18/2017, new or progressed since 2016. Mild T5 compression fracture is stable since 06/18/2017, new or progressed since 2016. The T6 through T10 levels appear intact. Severe T11 and moderate to severe T12 compression fractures are stable since 2018. No significant bony retropulsion. There is a subacute appearing fracture of the left 8 rib costovertebral junction with periosteal new bone  formation. There is a nearby unhealed posterior left 8th rib fracture with mild displacement on series 6, image 92. Mix of acute and chronic  posterior left 9th rib fractures, with the acute appearing fracture mildly displaced on series 6, image 109. Mix of chronic and subacute posterior bilateral 10th rib fractures. Possible superimposed acute left posterolateral 10th rib fracture partially visible on series 6, image 134. Mix of subacute and chronic appearing bilateral 11th and 12th rib fractures. Possible superimposed acute posterior left 12th rib fracture on series 6, image 157. Paraspinal and other soft tissues: Small layering left pleural effusion with confluent left lower lobe atelectasis. Mild dependent pulmonary atelectasis elsewhere. Moderate volume of aspirated or retained secretions in the trachea (series 6, image 63. No pericardial effusion. Calcified coronary artery and aortic atherosclerosis. The posterior thoracic paraspinal soft tissues appear normal. Disc levels: Congenital capacious thoracic spinal canal. No significant thoracic spinal stenosis suspected. CT LUMBAR SPINE FINDINGS Segmentation: Normal. Alignment: Stable since 2018. Chronic mild retrolisthesis of L5 on S1 with relatively preserved lumbar lordosis. Vertebrae: Osteopenia. Moderate chronic L1 compression fracture is stable since 2018. Mild chronic L2 inferior endplate compression is stable. Severe L3 compression fracture has progressed since 2018, the superior endplate is now compressed and was intact on the 2018 CT. The L4 vertebral body remains intact. Stable mild L5 compression fracture. No significant bony retropulsion. The visible sacrum and SI joints appear intact. Paraspinal and other soft tissues: Aortoiliac calcified atherosclerosis. Chronic bilateral nephrolithiasis. Chronic left renal atrophy. Chronic cholelithiasis. Diverticulosis of the descending colon with no active inflammation visible. The posterior paraspinal soft  tissues appear within normal limits. Disc levels: Chronic multifactorial mild to moderate lumbar spinal stenosis L2-L3 and L3-L4. Chronic severe multifactorial spinal stenosis at L4-L5. These appear not significantly changed since 2018. IMPRESSION: CT THORACIC SPINE IMPRESSION 1. Acute appearing fractures of the posterior left 8th, 9th, 10th, and possibly 12 ribs. Superimposed numerous subacute and chronic rib fractures. 2. Associated small layering pleural effusion or hemothorax. Left lower lobe atelectasis. 3. Moderate volume retained or aspirated secretions in the trachea. 4. T2 vertebral body compression fracture and T1 spinous process fracture occurred between 04/22/2017 and 06/18/2017, and are stable since the Cervical spine CT on 06/18/2017. 5. Compression fractures of T4 and T5 are stable since 06/18/2017 and probably more longstanding. 6. Chronic T11 and T12 compression fractures are stable since 2018. CT LUMBAR SPINE IMPRESSION 1. A severe L3 compression fracture has progressed since 2018 and is age indeterminate. 2. Chronic L1, L2, and L5 compression fractures are stable since 2018. 3. Chronic multilevel degenerative lumbar spinal stenosis has not significantly changed since 2018 and is worst at the L4-L5 level. 4.  Aortic Atherosclerosis (ICD10-I70.0). 5. Chronic left renal atrophy, nephrolithiasis, and cholelithiasis. Electronically Signed   By: Odessa Fleming M.D.   On: 07/03/2017 08:48        Scheduled Meds: . atorvastatin  5 mg Oral QPM  . ferrous sulfate  325 mg Oral Q M,W,F  . pantoprazole  40 mg Oral Daily   Continuous Infusions: . sodium chloride 100 mL/hr at 07/04/17 1018     LOS: 1 day    I have spent 35 minutes face to face with the patient and on the ward discussing the patients care, assessment, plan and disposition with other care givers. >50% of the time was devoted counseling the patient about the risks and benefits of treatment and coordinating care.     Angeleen Horney Joline Maxcy, MD [facility] Pager [phone]   If 7PM-7AM, please contact night-coverage [email or web address] Password [email or web address] 07/04/2017, 1:43 PM

## 2017-07-04 NOTE — Progress Notes (Signed)
PT Cancellation Note  Patient Details Name: [name] MRN: [id number] DOB: 1922/05/16   Cancelled Treatment:    Reason Eval/Treat Not Completed: Active bedrest order. Will defer to vestibular therapist.   Fabio Asa 07/04/2017, 7:50 AM Charlotte Crumb, PT DPT  Board Certified Neurologic Specialist [phone]

## 2017-07-04 NOTE — Evaluation (Signed)
Physical Therapy Evaluation Patient Details Name: [name] MRN: [id number] DOB: 10-Sep-1922 Today's Date: 07/04/2017   History of Present Illness  [name] is a 84 y.o. male with PHMx:  HTN, HLD, CKD stage III, COPD, CAD, DM, traumatic intracranial hemorrhage and subdural hematoma on 04/22/2017, admitted to the hospital and discharged on 04/23/2017 after suffering a fall due to dizziness. May 1st fell again due to dizziness  Clinical Impression  Patient with history of falls and recently worse demonstrates decreased balance, orthostatic hypotension, L posterior canal BPPV, decreased safety awareness, general rigidity and continued fall risk.  He was treated this session with success for L posterior canal BPPV.  Needs further follow up with vestibular PT via HHPT for follow up testing and tx as needed as well as for balance and safety training.  Recommend progress to outpatient neurorehabilitation when finished with HHPT.  PT to follow acutely.  Follow Up Recommendations Home health PT;Supervision/Assistance - 24 hour    Equipment Recommendations  None recommended by PT    Recommendations for Other Services       Precautions / Restrictions Precautions Precautions: Fall Precaution Comments: history of multiple falls with fractures Restrictions Weight Bearing Restrictions: No      Mobility  Bed Mobility Overal bed mobility: Needs Assistance Bed Mobility: Supine to Sit     Supine to sit: Min assist;HOB elevated     General bed mobility comments: requested help to lift trunk (+2 for assist for positioning for hallpike and Eply)  Transfers Overall transfer level: Needs assistance Equipment used: Rolling walker (2 wheeled) Transfers: Sit to/from Stand Sit to Stand: Min assist;Mod assist         General transfer comment: initially from bed mod A, then subsequently min A  Ambulation/Gait Ambulation/Gait assistance: Min guard;Supervision Ambulation Distance (Feet): 140  Feet Assistive device: Rolling walker (2 wheeled) Gait Pattern/deviations: Step-through pattern;Decreased stride length     General Gait Details: good foot clearance with walker, but pt used to 3 wheeled walker, managed RW with S  Stairs            Wheelchair Mobility    Modified Rankin (Stroke Patients Only)       Balance Overall balance assessment: Needs assistance   Sitting balance-Leahy Scale: Good Sitting balance - Comments: bending down to fix socks in chair   Standing balance support: No upper extremity supported;During functional activity Standing balance-Leahy Scale: Fair Standing balance comment: washed hands at sink                             Pertinent Vitals/Pain Pain Assessment: No/denies pain  Orthostatic VS for the past 24 hrs (Last 3 readings):  BP- Lying Pulse- Lying BP- Sitting Pulse- Sitting BP- Standing at 0 minutes Pulse- Standing at 0 minutes BP- Standing at 3 minutes Pulse- Standing at 3 minutes  07/04/17 0900 166/87 84 164/89 89 115/72 91 126/84 102  07/03/17 1029 173/79 71 157/80 83 152/77 80 - -      Home Living Family/patient expects to be discharged to:: Private residence Living Arrangements: Spouse/significant other Available Help at Discharge: Family;Available 24 hours/day Type of Home: House(@ [place]) Home Access: Level entry     Home Layout: One level Home Equipment: Walker - 2 wheels;Shower seat;Hand held shower head;Grab bars - tub/shower;Bedside commode;Cane - single point Additional Comments: pt reports he and wife help each other out    Prior Function Level of Independence: Independent with  assistive device(s)         Comments: help for cleaning and maintenance     Hand Dominance   Dominant Hand: Right    Extremity/Trunk Assessment   Upper Extremity Assessment Upper Extremity Assessment: Defer to OT evaluation    Lower Extremity Assessment Lower Extremity Assessment: Overall WFL for tasks  assessed(general stiffness)    Cervical / Trunk Assessment Cervical / Trunk Assessment: Kyphotic  Communication   Communication: HOH  Cognition Arousal/Alertness: Awake/alert Behavior During Therapy: WFL for tasks assessed/performed Overall Cognitive Status: History of cognitive impairments - at baseline                                 General Comments: reports stutters at times, increased time to get information out, but was accurate       General Comments General comments (skin integrity, edema, etc.): wife and daughter entered during session, wife on rollator with cervical muscular dysfunction   Vestibular Assessment - 07/04/17 1005      Vestibular Assessment   General Observation  Patient reports dizziness that lasts for a minute that is imbalance sometimes when first lying down or when first sitting up.  Has had mulitple falls, at least two this month.  X-rays showing both rib and vertebral compression fractures.      Symptom Behavior   Type of Dizziness  Imbalance    Frequency of Dizziness  intermittent    Duration of Dizziness  minutes    Aggravating Factors  Supine to sit;Turning body quickly;Sit to stand    Relieving Factors  Head stationary;Closing eyes;Slow movements      Occulomotor Exam   Occulomotor Alignment  Normal    Spontaneous  Absent      Positional Testing   Sidelying Test  Sidelying Right;Sidelying Left      Sidelying Right   Sidelying Right Duration  >30 sec    Sidelying Right Symptoms  Upbeat, right rotatory nystagmus couple of beats       Sidelying Left   Sidelying Left Duration  >30 sec    Sidelying Left Symptoms  Upbeat, left rotatory nystagmus lasting about 15 sec         Exercises Other Exercises Other Exercises: Performed canalith repositioning x 1 for L posterior canal BPPV   Assessment/Plan    PT Assessment Patient needs continued PT services  PT Problem List Decreased balance;Decreased knowledge of use of  DME;Decreased safety awareness;Decreased mobility;Decreased knowledge of precautions;Other (comment)(dizziness)       PT Treatment Interventions DME instruction;Therapeutic activities;Gait training;Therapeutic exercise;Patient/family education;Balance training;Functional mobility training    PT Goals (Current goals can be found in the Care Plan section)  Acute Rehab PT Goals Patient Stated Goal: to return home, decrease falls PT Goal Formulation: With patient/family Time For Goal Achievement: 07/11/17 Potential to Achieve Goals: Good    Frequency Min 3X/week   Barriers to discharge        Co-evaluation PT/OT/SLP Co-Evaluation/Treatment: Yes Reason for Co-Treatment: To address functional/ADL transfers PT goals addressed during session: Mobility/safety with mobility;Balance;Proper use of DME         AM-PAC PT "6 Clicks" Daily Activity  Outcome Measure Difficulty turning over in bed (including adjusting bedclothes, sheets and blankets)?: A Lot Difficulty moving from lying on back to sitting on the side of the bed? : Unable Difficulty sitting down on and standing up from a chair with arms (e.g., wheelchair, bedside commode, etc,.)?: Unable Help  needed moving to and from a bed to chair (including a wheelchair)?: A Little Help needed walking in hospital room?: A Little Help needed climbing 3-5 steps with a railing? : A Lot 6 Click Score: 12    End of Session Equipment Utilized During Treatment: Gait belt Activity Tolerance: Patient tolerated treatment well Patient left: with call bell/phone within reach;in chair;with chair alarm set;with family/visitor present   PT Visit Diagnosis: Other abnormalities of gait and mobility (R26.89);BPPV;History of falling (Z91.81);Repeated falls (R29.6) BPPV - Right/Left : Left    Time: 4098-1191 PT Time Calculation (min) (ACUTE ONLY): 49 min   Charges:   PT Evaluation $PT Eval Moderate Complexity: 1 Mod PT Treatments $Canalith Rep Proc:  8-22 mins   PT G CodesSheran Lawless, [place] [phone] 07/04/2017   Elray Mcgregor 07/04/2017, 10:07 AM

## 2017-07-04 NOTE — Evaluation (Signed)
Occupational Therapy Evaluation Patient Details Name: [name] MRN: [id number] DOB: August 15, 1922 Today's Date: 07/04/2017    History of Present Illness [name] is a 84 y.o. male with PHMx:  HTN, HLD, CKD stage III, COPD, CAD, DM, traumatic intracranial hemorrhage and subdural hematoma on 04/22/2017, admitted to the hospital and discharged on 04/23/2017 after suffering a fall due to dizziness. May 1st fell again due to dizzinessAcute appearing fractures of the posterior left 8th, 9th, 10th, and possibly 12 ribs. Superimposed numerous subacute and chronic rib fractures.   Clinical Impression   This 63 up male admitted with above presents to acute OT with decreased balance thus affecting his safety and independence with basic ADLs. He will benefit from acute OT with follow up HHOT.     Follow Up Recommendations  Home health OT;Supervision/Assistance - 24 hour    Equipment Recommendations  None recommended by OT       Precautions / Restrictions Precautions Precautions: Fall Precaution Comments: history of multiple falls with fractures      Mobility Bed Mobility Overal bed mobility: Needs Assistance Bed Mobility: Supine to Sit     Supine to sit: Min assist;HOB elevated     General bed mobility comments: requested help to lift trunk (+2 for assist for positioning for hallpike and Eply)  Transfers Overall transfer level: Needs assistance Equipment used: Rolling walker (2 wheeled) Transfers: Sit to/from Stand Sit to Stand: Min assist;Mod assist         General transfer comment: initially from bed mod A, then subsequently min A    Balance Overall balance assessment: Needs assistance   Sitting balance-Leahy Scale: Good Sitting balance - Comments: bending down to fix socks in chair   Standing balance support: No upper extremity supported;During functional activity Standing balance-Leahy Scale: Fair Standing balance comment: washed hands at sink                            ADL either performed or assessed with clinical judgement   ADL Overall ADL's : Needs assistance/impaired Eating/Feeding: Independent;Sitting   Grooming: Min guard;Standing   Upper Body Bathing: Set up;Supervision/ safety;Sitting   Lower Body Bathing: Min guard;Sit to/from stand   Upper Body Dressing : Set up;Supervision/safety;Sitting   Lower Body Dressing: Min guard;Sit to/from stand   Toilet Transfer: Min guard;Ambulation Toilet Transfer Details (indicate cue type and reason): bed>around and out door>back in room to sink>recliner Toileting- Architect and Hygiene: Min guard;Sit to/from stand                            Pertinent Vitals/Pain Pain Assessment: No/denies pain     Hand Dominance Right   Extremity/Trunk Assessment Upper Extremity Assessment Upper Extremity Assessment: Overall WFL for tasks assessed   Lower Extremity Assessment Lower Extremity Assessment: Overall WFL for tasks assessed(general stiffness)   Cervical / Trunk Assessment Cervical / Trunk Assessment: Kyphotic   Communication Communication Communication: HOH   Cognition Arousal/Alertness: Awake/alert Behavior During Therapy: WFL for tasks assessed/performed Overall Cognitive Status: History of cognitive impairments - at baseline                                 General Comments: reports stutters at times, increased time to get information out, but was accurate    General Comments  wife and daughter entered during session, wife on  rollator with cervical muscular dysfunction    Exercises Other Exercises Other Exercises: Assisted PT in performing canalith repositioning x 1 for L posterior canal BPPV        Home Living Family/patient expects to be discharged to:: Private residence Living Arrangements: Spouse/significant other Available Help at Discharge: Family;Available 24 hours/day Type of Home: House(@ [place]) Home Access: Level  entry     Home Layout: One level     Bathroom Shower/Tub: Producer, television/film/video: Standard     Home Equipment: Environmental consultant - 2 wheels;Shower seat;Hand held shower head;Grab bars - tub/shower;Bedside commode;Cane - single point   Additional Comments: pt reports he and wife help each other out      Prior Functioning/Environment Level of Independence: Independent with assistive device(s)        Comments: help for cleaning and maintenance        OT Problem List: Impaired balance (sitting and/or standing)      OT Treatment/Interventions: Self-care/ADL training;Balance training;DME and/or AE instruction;Patient/family education    OT Goals(Current goals can be found in the care plan section) Acute Rehab OT Goals Patient Stated Goal: to return home, decrease falls OT Goal Formulation: With patient Time For Goal Achievement: 07/18/17 Potential to Achieve Goals: Good  OT Frequency: Min 2X/week           Co-evaluation PT/OT/SLP Co-Evaluation/Treatment: Yes Reason for Co-Treatment: To address functional/ADL transfers PT goals addressed during session: Mobility/safety with mobility;Balance;Proper use of DME OT goals addressed during session: ADL's and self-care;Strengthening/ROM      AM-PAC PT "6 Clicks" Daily Activity     Outcome Measure Help from another person eating meals?: None Help from another person taking care of personal grooming?: A Little Help from another person toileting, which includes using toliet, bedpan, or urinal?: A Little Help from another person bathing (including washing, rinsing, drying)?: A Little Help from another person to put on and taking off regular upper body clothing?: A Little Help from another person to put on and taking off regular lower body clothing?: A Little 6 Click Score: 19   End of Session Equipment Utilized During Treatment: Gait belt;Rolling walker Nurse Communication: Mobility status  Activity Tolerance: Patient  tolerated treatment well Patient left: in chair;with call bell/phone within reach;with chair alarm set  OT Visit Diagnosis: Unsteadiness on feet (R26.81);Repeated falls (R29.6);History of falling (Z91.81)                Time: 8295-6213 OT Time Calculation (min): 49 min Charges:  OT General Charges $OT Visit: 1 Visit OT Evaluation $OT Eval Moderate Complexity: [address], [place] [phone] 07/04/2017

## 2017-07-04 NOTE — Progress Notes (Signed)
07/04/17 1228  What Happened  Was fall witnessed? No  Was patient injured? No  Patient found on floor  Found by Staff-comment (housekeeping)  Stated prior activity to/from bed, chair, or stretcher  Follow Up  MD notified Nelson Chimes  Time MD notified 1236  Family notified Yes-comment  Time family notified 1241  Additional tests No  Progress note created (see row info) Yes  Adult Fall Risk Assessment  Risk Factor Category (scoring not indicated) Fall has occurred during this admission (document High fall risk);History of more than one fall within 6 months before admission (document High fall risk);High fall risk per protocol (document High fall risk)  Age 84  Fall History: Fall within 6 months prior to admission 5  Elimination; Bowel and/or Urine Incontinence 2  Elimination; Bowel and/or Urine Urgency/Frequency 2  Medications: includes PCA/Opiates, Anti-convulsants, Anti-hypertensives, Diuretics, Hypnotics, Laxatives, Sedatives, and Psychotropics 0  Patient Care Equipment 2  Mobility-Assistance 2  Mobility-Gait 2  Mobility-Sensory Deficit 0  Altered awareness of immediate physical environment 1  Impulsiveness 2  Lack of understanding of one's physical/cognitive limitations 4  Total Score 25  Patient's Fall Risk High Fall Risk (>13 points)  Adult Fall Risk Interventions  Required Bundle Interventions *See Row Information* High fall risk - low, moderate, and high requirements implemented  Additional Interventions Use of appropriate toileting equipment (bedpan, BSC, etc.);PT/OT need assessed if change in mobility from baseline  Screening for Fall Injury Risk (To be completed on HIGH fall risk patients) - Assessing Need for Low Bed  Risk For Fall Injury- Low Bed Criteria 85 years or older;Previous fall this admission;Admitted as a result of a fall  Will Implement Low Bed and Floor Mats Yes  Vitals  Temp 98.2 F (36.8 C)  Temp Source Oral  BP (!) 142/71  BP Location Left Arm  BP  Method Automatic  Patient Position (if appropriate) Sitting  Pulse Rate 83  Pulse Rate Source Monitor  ECG Heart Rate 99  Resp 18  Oxygen Therapy  SpO2 99 %  O2 Device Room Air  O2 Flow Rate (L/min) 0 L/min  Pain Assessment  Pain Scale 0-10  Pain Score 0  Neurological  Neuro (WDL) WDL  Level of Consciousness Alert  Orientation Level Oriented X4 (intermittent confusion)  Cognition Memory impairment;Impulsive;Poor safety awareness  Speech Clear  Pupil Assessment  Yes  R Pupil Size (mm) 3  R Pupil Shape Round  R Pupil Reaction Brisk  L Pupil Size (mm) 3  L Pupil Shape Round  L Pupil Reaction Brisk  Additional Pupil Assessments No  Motor Function/Sensation Assessment Grip  Facial Symmetry Symmetrical  R Hand Grip Present;Moderate  L Hand Grip Present;Moderate   R Foot Dorsiflexion Present;Moderate  L Foot Dorsiflexion Present;Moderate  R Foot Plantar Flexion Present;Moderate  L Foot Plantar Flexion Present;Moderate  Neuro Symptoms Forgetful  Glasgow Coma Scale  Eye Opening 4  Best Motor Response 6  Best Verbal Response (NON-intubated) 4  Glasgow Coma Scale Score 14  Musculoskeletal  Musculoskeletal (WDL) X  Assistive Device Front wheel walker  Generalized Weakness Yes  Weight Bearing Restrictions No  Musculoskeletal Details  RUE Full movement  RLE Full movement  LUE Full movement  LLE Full movement  Integumentary  Integumentary (WDL) X  Skin Color Appropriate for ethnicity;Mottled  Skin Condition Dry  Skin Integrity Abrasion;Ecchymosis  Abrasion Location Head;Arm  Abrasion Location Orientation Left;Right  Abrasion Intervention Cleansed;Other (Comment) (assessed)  Ecchymosis Location Abdomen;Rib  Ecchymosis Location Orientation Left  Ecchymosis Intervention Other (Comment) (  assessed)  Skin Turgor Non-tenting

## 2017-07-04 NOTE — Progress Notes (Signed)
  Echocardiogram 2D Echocardiogram has been performed.  [name] F 07/04/2017, 3:26 PM

## 2017-07-04 NOTE — Progress Notes (Signed)
*  PRELIMINARY RESULTS* Vascular Ultrasound Carotid Duplex (Doppler) has been completed.   Findings suggest 1-39% stenosis involving visualized segments of bilateral internal carotid arteries.  07/04/2017 1:57 PM Gertie Fey, BS, RVT, RDCS, RDMS

## 2017-07-04 NOTE — Progress Notes (Signed)
OT Cancellation Note  Patient Details Name: [name] MRN: [id number] DOB: Jun 05, 1922   Cancelled Treatment:    Reason Eval/Treat Not Completed: Active bedrest order.  Evette Georges [phone] 07/04/2017, 8:49 AM

## 2017-07-04 NOTE — Progress Notes (Signed)
PT Note Addendum:  Events of the afternoon noted.  Patient may need STSNF level rehab at d/c due to fall risk and decreased safety awareness. Will follow up.  ] Sheran Lawless, PT [phone] 07/04/2017

## 2017-07-04 NOTE — Progress Notes (Signed)
Pt found in sitting position on floor between bed and chair by housekeeping. Pt has been more forgetful and confused when his family is not at the bedside. Pt wife left around 12:00 pm and pt found at 12:20 pm. PT worked with pt this AM and transferred pt to chair with the chair alarm under the pt but it was not turned on. Pt denied any pain, no new injuries noted. Dr. Nelson Chimes notified at 12:36 PM and he just stated to make sure all alarms are on and working properly. Pt family notified. High fall risk interventions including low bed, floor mats, yellow socks in place. Complete head to toe assessment complete and no changes noted from AM assessment. Neuro checks completed with no changes. Will continue to monitor.

## 2017-07-05 LAB — BASIC METABOLIC PANEL
Anion gap: 9 (ref 5–15)
BUN: 18 mg/dL (ref 6–20)
CALCIUM: 9.2 mg/dL (ref 8.9–10.3)
CO2: 25 mmol/L (ref 22–32)
CREATININE: 1.77 mg/dL — AB (ref 0.61–1.24)
Chloride: 107 mmol/L (ref 101–111)
GFR calc Af Amer: 36 mL/min — ABNORMAL LOW (ref >60)
GFR, EST NON AFRICAN AMERICAN: 31 mL/min — AB (ref >60)
GLUCOSE: 124 mg/dL — AB (ref 65–99)
Potassium: 3.8 mmol/L (ref 3.5–5.1)
SODIUM: 141 mmol/L (ref 135–145)

## 2017-07-05 LAB — CBC
HCT: 34.6 % — ABNORMAL LOW (ref 39.0–52.0)
Hemoglobin: 11.1 g/dL — ABNORMAL LOW (ref 13.0–17.0)
MCH: 28.7 pg (ref 26.0–34.0)
MCHC: 32.1 g/dL (ref 30.0–36.0)
MCV: 89.4 fL (ref 78.0–100.0)
PLATELETS: 246 10*3/uL (ref 150–400)
RBC: 3.87 MIL/uL — ABNORMAL LOW (ref 4.22–5.81)
RDW: 14.2 % (ref 11.5–15.5)
WBC: 10.2 10*3/uL (ref 4.0–10.5)

## 2017-07-05 LAB — MAGNESIUM: MAGNESIUM: 1.7 mg/dL (ref 1.7–2.4)

## 2017-07-05 MED ORDER — OXYCODONE HCL 5 MG PO CAPS: 5.0000 mg | ORAL_CAPSULE | ORAL | 0 refills | Status: DC | PRN

## 2017-07-05 MED ORDER — LIDOCAINE 5 % EX PTCH: 1.0000 | MEDICATED_PATCH | CUTANEOUS | 0 refills | Status: AC

## 2017-07-05 MED ORDER — SENNA 8.6 MG PO TABS: 1.0000 | ORAL_TABLET | Freq: Every evening | ORAL | 0 refills | Status: AC | PRN

## 2017-07-05 MED ORDER — POLYETHYLENE GLYCOL 3350 17 G PO PACK: 17.0000 g | PACK | Freq: Every day | ORAL | 0 refills | Status: AC | PRN

## 2017-07-05 NOTE — Care Management (Addendum)
Discussed returning to [facility] with pt's wife and daughter.  Per family, patient lives in [place] portion of [place] and has received therapy through [facility] in the past that they would like to continue.  Per Misty Stanley at [facility], they can get this arranged on Monday.  HH orders faxed to [phone].  Family aware.  Family requests ambulance.  [place] form printed and number for [facility] given to staff to call when ready.   Update 1530- When revisited the pt & family to advised that therapy would be set up through [facility] on Monday, they state that they want him to return to the SNF unit at [facility] and they expect a bed to be available tonight.  The patient's wife states she cannot take care of him in their independent home and family does not feel that they can assist tonight.  They are concerned because he tries to get up and resists them at times.  Spoke with Tresa Endo, [facility] admissions coordinator, who states there will be a bed available in the SNF tomorrow morning around 10:30.  Delice Bison, CSW, notified that patient will need FL2, etc.  Tresa Endo will check with insurance in the morning.  Pt will transfer by [facility].

## 2017-07-05 NOTE — Discharge Summary (Addendum)
Physician Discharge Summary  [name] WUJ:[id number] DOB: January 13, 1923 DOA: 07/03/2017  PCP: Merlene Laughter, MD  Admit date: 07/03/2017 Discharge date: 07/05/2017  Admitted From: Assisted Living  Disposition:  SNF  Recommendations for Outpatient Follow-up:  1. Follow up with PCP in 1-2 weeks 2. Please obtain BMP/CBC in one week your next doctors visit.  3. Would avoid giving patient any antihypertensive medicines at this time therefore I am holding metoprolol and amlodipine until he was evaluated by outpatient PCP.  Suspect he has underlying autonomic dysfunction. 4. Given him pain medications-oxycodone 5 mg immediate release to be taken every 4-6 hours for severe pain with caution.  Has been prescribed bowel regimen 5. -I also prescribed him lidocaine patches.  Otherwise he can take over-the-counter Tylenol 6. Would advise to get follow-up two-view chest x-ray/rib series in about 2-3 weeks. Can be done with PCP.    Discharge Condition: Stable CODE STATUS: Full code per wife Diet recommendation: Regular  Brief/Interim Summary: 84 year old with past medical history of essential hypertension, hyperlipidemia, CKD stage III, COPD, CAD status post stent placement in September 2016, hyperlipidemia, diabetes mellitus type 2, GERD, intracranial hemorrhage in March 2019 was admitted to the hospital with complains of dizziness.  Upon admission patient reported a fall, dizziness with change in position and decreased p.o. intake.  He states every time he turns his head or gets up from seated position he feels quite dizzy causing him to fall.  CT of the head was negative for any acute traumatic intracranial injury but showed mildly comminuted fracture of the posterior spinous process of T1, moderate cortical volume loss.  CT of the lumbar and thoracic spine showed acute fractures of the left eighth, ninth, 10th and possibly 12th rib.  Associated with small pleural effusion or hemothorax.  Compression  fracture of T4/T5 which had been present previously, chronic T11 and T12 compression fracture.  Severe L3, chronic L1, L2 and L5 compression fracture which have all been present previously.  Patient was admitted to the hospital for further work-up  Despite the patient getting enough IV fluids he is to remain orthostatic.  His echocardiogram showed ejection fraction 50-55% with grade 1 diastolic dysfunction.  Carotid ultrasound showed bilateral stenosis 1-39%.  He was evaluated by physical therapy who recommended patient will benefit from skilled nursing facility.  I suspect patient has underlying autonomic dysfunction therefore I have advised the family members and the patient how to avoid rapid change in position which can lead to this subsequently fall.  He has been given pain medications for his rib fracture along with lidocaine patch but to be used with caution.  He is at high risk for atelectasis and subsequently risk for pneumonia.  Advised to use incentive spirometry.  For now I have discontinued his amlodipine and metoprolol.  Needs to follow-up with outpatient primary care physician in 1 week to have them reevaluated.  Otherwise he is resection benefit from an hospital stay and stable to be discharged with outpatient follow-up.  Due to his history of dementia during the hospital stay he did have moments of delirium which have educated the family about.  On the day of discharge I spoke with the patient, his wife and all of his daughters at bedside.  All the questions were answered.   Discharge Diagnoses:  Principal Problem:   Falls Active Problems:   Lap Heller Myotomy Oct 2013   Essential hypertension   Chronic renal disease, stage III (HCC)   DJD (degenerative joint disease), thoracic  GERD (gastroesophageal reflux disease)   Diabetes (HCC)   CAD -S/P LAD and CFX DES 10/26/14   Dyslipidemia   Traumatic intracerebral hemorrhage (HCC)   Anemia   Multiple traumatic falls-causing  multiple rib fractures stable Dizziness-autonomic dysfunction Chronic thoracic and lumbar compression fractures -I highly suspect patient has underlying autonomic dysfunction on top of mild dehydration - Carotid ultrasound done- 1-39% bilateral stenosis -Echocardiogram shows ejection fraction 55% with grade 1 diastolic dysfunction - Despite of getting IV fluids patient remains orthostatic therefore suspicion for autonomic dysfunction.  He is been advised to use compression stockings.  If necessary he might had to be placed on Midodrine -Advised to keep up with oral hydration -Physical therapy recommends skilled nursing facility  -Provide supportive care -I asked him to use basic caution such as avoiding sudden change in position, keeping his home well, use guardrails and something for support as ambulating etc -For pain medications he will be given oxycodone to be used with caution.  Family has been educated about this.  Will also give lidocaine patch for local pain control otherwise he can use oral Tylenol for mild to moderate pain. - Incentive spirometry  Coronary artery disease status post stent - Hold off on metoprolol.  Chronic kidney disease stage III -Creatinine appears to be stable around baseline of 1.6  Essential hypertension -Holding his home Norvasc.  Will need to follow-up with outpatient PCP to determine if this can be restarted  Hyperlipidemia -On statin  Diabetes mellitus type 2 -Accu-Chek insulin sliding scale  GERD -Continue PPI  Anemia of chronic disease -Continue iron supplements     Discharge Instructions  Discharge Instructions    Compression stockings   Complete by:  As directed      Allergies as of 07/05/2017   No Known Allergies     Medication List    STOP taking these medications   amLODipine 2.5 MG tablet Commonly known as:  NORVASC   metoprolol tartrate 25 MG tablet Commonly known as:  LOPRESSOR     TAKE these medications    acetaminophen 325 MG tablet Commonly known as:  TYLENOL Take 650 mg by mouth every 6 (six) hours as needed for mild pain, moderate pain, fever or headache.   atorvastatin 10 MG tablet Commonly known as:  LIPITOR Take 5 mg by mouth every evening.   docusate sodium 100 MG capsule Commonly known as:  COLACE Take 100 mg by mouth 2 (two) times daily as needed for mild constipation.   ferrous sulfate 325 (65 FE) MG tablet Take 325 mg by mouth every Monday, Wednesday, and Friday.   metFORMIN 500 MG 24 hr tablet Commonly known as:  GLUCOPHAGE-XR Take 1 tablet (500 mg total) by mouth every morning. What changed:  when to take this   multivitamin-lutein Caps capsule Take 1 capsule by mouth daily.   nitroGLYCERIN 0.4 MG SL tablet Commonly known as:  NITROSTAT Place 0.4 mg under the tongue every 5 (five) minutes as needed for chest pain.   omeprazole 10 MG capsule Commonly known as:  PRILOSEC Take 10 mg by mouth daily.      Follow-up Information    Stoneking, Hal, MD. Schedule an appointment as soon as possible for a visit in 1 week(s).   Specialty:  Internal Medicine Contact information: [address] [place] [address] [phone]          No Known Allergies  You were cared for by a hospitalist during your hospital stay. If you  have any questions about your discharge medications or the care you received while you were in the hospital after you are discharged, you can call the unit and asked to speak with the hospitalist on call if the hospitalist that took care of you is not available. Once you are discharged, your primary care physician will handle any further medical issues. Please note that no refills for any discharge medications will be authorized once you are discharged, as it is imperative that you return to your primary care physician (or establish a relationship with a primary care physician if you do not have one) for your aftercare needs so that  they can reassess your need for medications and monitor your lab values.  Consultations:  None   Procedures/Studies: Dg Chest 2 View  Result Date: 06/18/2017 CLINICAL DATA:  Fall earlier today. EXAM: CHEST - 2 VIEW COMPARISON:  10/03/2015 FINDINGS: Lungs are hyperexpanded. Interstitial markings are diffusely coarsened with chronic features. No overt edema or pleural effusion. No focal airspace consolidation. Bones are diffusely demineralized. Multiple compression fractures are identified in the thoracic and lumbar spine. IMPRESSION: Hyperexpansion with underlying chronic interstitial changes. No acute cardiopulmonary process. Osteopenia with multiple compression fractures in the thoracolumbar spine. Electronically Signed   By: Kennith Center M.D.   On: 06/18/2017 18:08   Dg Thoracic Spine 2 View  Result Date: 06/18/2017 CLINICAL DATA:  Fall.  Pain. EXAM: THORACIC SPINE 2 VIEWS COMPARISON:  None. FINDINGS: Bones are diffusely demineralized. Age-indeterminate compression fractures are identified at T2, T4, T5, T11, T12, and L1. the most advanced compression deformity is at T4. IMPRESSION: Multiple age indeterminate compression fractures in the thoracolumbar spine. Electronically Signed   By: Kennith Center M.D.   On: 06/18/2017 18:10   Ct Head Wo Contrast  Result Date: 07/03/2017 CLINICAL DATA:  Status post fall, with laceration at the left side of the head. Neck pain. Initial encounter. EXAM: CT HEAD WITHOUT CONTRAST CT CERVICAL SPINE WITHOUT CONTRAST TECHNIQUE: Multidetector CT imaging of the head and cervical spine was performed following the standard protocol without intravenous contrast. Multiplanar CT image reconstructions of the cervical spine were also generated. COMPARISON:  CT of the head and cervical spine performed 06/18/2017 FINDINGS: CT HEAD FINDINGS Brain: No evidence of acute infarction, hemorrhage, hydrocephalus, extra-axial collection or mass lesion / mass effect. Prominence of the  ventricles and sulci reflects moderate cortical volume loss. Cerebellar atrophy is noted. Scattered periventricular and subcortical white matter change likely reflects small vessel ischemic microangiopathy. The brainstem and fourth ventricle are within normal limits. The basal ganglia are unremarkable in appearance. The cerebral hemispheres demonstrate grossly normal gray-white differentiation. No mass effect or midline shift is seen. Vascular: No hyperdense vessel or unexpected calcification. Skull: There is no evidence of fracture; visualized osseous structures are unremarkable in appearance. Sinuses/Orbits: The orbits are within normal limits. The paranasal sinuses and mastoid air cells are well-aerated. Other: No significant soft tissue abnormalities are seen. CT CERVICAL SPINE FINDINGS Alignment: There is mild grade 1 anterolisthesis of C7 on T1, reflecting underlying facet disease. Skull base and vertebrae: There is a mildly comminuted fracture of the posterior spinous process of T1. There is slight chronic loss of height at vertebral body T2. Soft tissues and spinal canal: No prevertebral fluid or swelling. No visible canal hematoma. Disc levels: Mild multilevel disc space narrowing is noted along the lower cervical spine, with scattered anterior and posterior disc osteophyte complexes. Upper chest: Minimal atelectasis is noted at the left lung apex. The visualized  portions of the thyroid gland are grossly unremarkable. Mild calcification is seen at the carotid bifurcations bilaterally. Other: No additional soft tissue abnormalities are seen. IMPRESSION: 1. No evidence of traumatic intracranial injury. 2. Mildly comminuted fracture of the posterior spinous process of T1. 3. No evidence of acute fracture or subluxation along the cervical spine. 4. Moderate cortical volume loss and scattered small vessel ischemic microangiopathy. 5. Mild degenerative change along the lower cervical spine. 6. Mild calcification  at the carotid bifurcations bilaterally. Carotid ultrasound could be considered for further evaluation, when and as deemed clinically appropriate. Electronically Signed   By: Roanna Raider M.D.   On: 07/03/2017 07:01   Ct Head Wo Contrast  Result Date: 06/18/2017 CLINICAL DATA:  Fall, abrasion to posterior head EXAM: CT HEAD WITHOUT CONTRAST CT CERVICAL SPINE WITHOUT CONTRAST TECHNIQUE: Multidetector CT imaging of the head and cervical spine was performed following the standard protocol without intravenous contrast. Multiplanar CT image reconstructions of the cervical spine were also generated. COMPARISON:  CT head dated 05/12/2017. CT head/cervical spine dated 04/22/2017. FINDINGS: CT HEAD FINDINGS Brain: No evidence of acute infarction, hemorrhage, hydrocephalus, extra-axial collection or mass lesion/mass effect. Global cortical atrophy. Mild subcortical white matter and periventricular small vessel ischemic changes. Vascular: Intracranial atherosclerosis. Skull: Normal. Negative for fracture or focal lesion. Sinuses/Orbits: The visualized paranasal sinuses are essentially clear. The mastoid air cells are unopacified. Other: None. CT CERVICAL SPINE FINDINGS Alignment: Normal cervical lordosis. Skull base and vertebrae: No acute fracture. No primary bone lesion or focal pathologic process. Soft tissues and spinal canal: No prevertebral fluid or swelling. No visible canal hematoma. Disc levels:  Mild degenerative changes of the mid cervical spine. Spinal canal is patent. Upper chest: Visualized lung apices are clear. Other: Visualized thyroid is unremarkable. IMPRESSION: No evidence of acute intracranial abnormality. Atrophy with small vessel ischemic changes. No evidence of traumatic injury to the cervical spine. Mild degenerative changes. Electronically Signed   By: Charline Bills M.D.   On: 06/18/2017 18:32   Ct Cervical Spine Wo Contrast  Result Date: 07/03/2017 CLINICAL DATA:  Status post fall, with  laceration at the left side of the head. Neck pain. Initial encounter. EXAM: CT HEAD WITHOUT CONTRAST CT CERVICAL SPINE WITHOUT CONTRAST TECHNIQUE: Multidetector CT imaging of the head and cervical spine was performed following the standard protocol without intravenous contrast. Multiplanar CT image reconstructions of the cervical spine were also generated. COMPARISON:  CT of the head and cervical spine performed 06/18/2017 FINDINGS: CT HEAD FINDINGS Brain: No evidence of acute infarction, hemorrhage, hydrocephalus, extra-axial collection or mass lesion / mass effect. Prominence of the ventricles and sulci reflects moderate cortical volume loss. Cerebellar atrophy is noted. Scattered periventricular and subcortical white matter change likely reflects small vessel ischemic microangiopathy. The brainstem and fourth ventricle are within normal limits. The basal ganglia are unremarkable in appearance. The cerebral hemispheres demonstrate grossly normal gray-white differentiation. No mass effect or midline shift is seen. Vascular: No hyperdense vessel or unexpected calcification. Skull: There is no evidence of fracture; visualized osseous structures are unremarkable in appearance. Sinuses/Orbits: The orbits are within normal limits. The paranasal sinuses and mastoid air cells are well-aerated. Other: No significant soft tissue abnormalities are seen. CT CERVICAL SPINE FINDINGS Alignment: There is mild grade 1 anterolisthesis of C7 on T1, reflecting underlying facet disease. Skull base and vertebrae: There is a mildly comminuted fracture of the posterior spinous process of T1. There is slight chronic loss of height at vertebral body T2. Soft tissues and  spinal canal: No prevertebral fluid or swelling. No visible canal hematoma. Disc levels: Mild multilevel disc space narrowing is noted along the lower cervical spine, with scattered anterior and posterior disc osteophyte complexes. Upper chest: Minimal atelectasis is noted  at the left lung apex. The visualized portions of the thyroid gland are grossly unremarkable. Mild calcification is seen at the carotid bifurcations bilaterally. Other: No additional soft tissue abnormalities are seen. IMPRESSION: 1. No evidence of traumatic intracranial injury. 2. Mildly comminuted fracture of the posterior spinous process of T1. 3. No evidence of acute fracture or subluxation along the cervical spine. 4. Moderate cortical volume loss and scattered small vessel ischemic microangiopathy. 5. Mild degenerative change along the lower cervical spine. 6. Mild calcification at the carotid bifurcations bilaterally. Carotid ultrasound could be considered for further evaluation, when and as deemed clinically appropriate. Electronically Signed   By: Roanna Raider M.D.   On: 07/03/2017 07:01   Ct Cervical Spine Wo Contrast  Result Date: 06/18/2017 CLINICAL DATA:  Fall, abrasion to posterior head EXAM: CT HEAD WITHOUT CONTRAST CT CERVICAL SPINE WITHOUT CONTRAST TECHNIQUE: Multidetector CT imaging of the head and cervical spine was performed following the standard protocol without intravenous contrast. Multiplanar CT image reconstructions of the cervical spine were also generated. COMPARISON:  CT head dated 05/12/2017. CT head/cervical spine dated 04/22/2017. FINDINGS: CT HEAD FINDINGS Brain: No evidence of acute infarction, hemorrhage, hydrocephalus, extra-axial collection or mass lesion/mass effect. Global cortical atrophy. Mild subcortical white matter and periventricular small vessel ischemic changes. Vascular: Intracranial atherosclerosis. Skull: Normal. Negative for fracture or focal lesion. Sinuses/Orbits: The visualized paranasal sinuses are essentially clear. The mastoid air cells are unopacified. Other: None. CT CERVICAL SPINE FINDINGS Alignment: Normal cervical lordosis. Skull base and vertebrae: No acute fracture. No primary bone lesion or focal pathologic process. Soft tissues and spinal canal:  No prevertebral fluid or swelling. No visible canal hematoma. Disc levels:  Mild degenerative changes of the mid cervical spine. Spinal canal is patent. Upper chest: Visualized lung apices are clear. Other: Visualized thyroid is unremarkable. IMPRESSION: No evidence of acute intracranial abnormality. Atrophy with small vessel ischemic changes. No evidence of traumatic injury to the cervical spine. Mild degenerative changes. Electronically Signed   By: Charline Bills M.D.   On: 06/18/2017 18:32   Ct Thoracic Spine Wo Contrast  Result Date: 07/03/2017 CLINICAL DATA:  84 year old male status post fall while walking to the bathroom this morning at assisted living facility. Age indeterminate compression fractures seen on thoracic spine series 06/18/2017. EXAM: CT THORACIC AND LUMBAR SPINE WITHOUT CONTRAST TECHNIQUE: Multidetector CT imaging of the thoracic and lumbar spine was performed without contrast. Multiplanar CT image reconstructions were also generated. COMPARISON:  Cervical spine CT 0631 hours today, 06/18/2017, 04/22/2017. CT Abdomen and Pelvis 09/06/2016. chest radiographs 10/21/2014. FINDINGS: CT THORACIC SPINE FINDINGS Segmentation: Normal. Alignment: Mildly exaggerated thoracic kyphosis, stable since 06/18/2017. Vertebrae: Osteopenia. A comminuted fracture of the T1 spinous process was 1st demonstrated on the cervical spine CT 06/18/2017, and occurred since 04/22/2017. There is some periosteal new bone formation (series 6, image 21). This is a stable injury. The other T1 vertebra components appear stable and intact. T2 mild-to-moderate compression fracture is also new since 04/22/2017 but stable since 06/18/2017. Moderate T4 compression fracture is stable since 06/18/2017, new or progressed since 2016. Mild T5 compression fracture is stable since 06/18/2017, new or progressed since 2016. The T6 through T10 levels appear intact. Severe T11 and moderate to severe T12 compression fractures are stable  since  2018. No significant bony retropulsion. There is a subacute appearing fracture of the left 8 rib costovertebral junction with periosteal new bone formation. There is a nearby unhealed posterior left 8th rib fracture with mild displacement on series 6, image 92. Mix of acute and chronic posterior left 9th rib fractures, with the acute appearing fracture mildly displaced on series 6, image 109. Mix of chronic and subacute posterior bilateral 10th rib fractures. Possible superimposed acute left posterolateral 10th rib fracture partially visible on series 6, image 134. Mix of subacute and chronic appearing bilateral 11th and 12th rib fractures. Possible superimposed acute posterior left 12th rib fracture on series 6, image 157. Paraspinal and other soft tissues: Small layering left pleural effusion with confluent left lower lobe atelectasis. Mild dependent pulmonary atelectasis elsewhere. Moderate volume of aspirated or retained secretions in the trachea (series 6, image 63. No pericardial effusion. Calcified coronary artery and aortic atherosclerosis. The posterior thoracic paraspinal soft tissues appear normal. Disc levels: Congenital capacious thoracic spinal canal. No significant thoracic spinal stenosis suspected. CT LUMBAR SPINE FINDINGS Segmentation: Normal. Alignment: Stable since 2018. Chronic mild retrolisthesis of L5 on S1 with relatively preserved lumbar lordosis. Vertebrae: Osteopenia. Moderate chronic L1 compression fracture is stable since 2018. Mild chronic L2 inferior endplate compression is stable. Severe L3 compression fracture has progressed since 2018, the superior endplate is now compressed and was intact on the 2018 CT. The L4 vertebral body remains intact. Stable mild L5 compression fracture. No significant bony retropulsion. The visible sacrum and SI joints appear intact. Paraspinal and other soft tissues: Aortoiliac calcified atherosclerosis. Chronic bilateral nephrolithiasis. Chronic  left renal atrophy. Chronic cholelithiasis. Diverticulosis of the descending colon with no active inflammation visible. The posterior paraspinal soft tissues appear within normal limits. Disc levels: Chronic multifactorial mild to moderate lumbar spinal stenosis L2-L3 and L3-L4. Chronic severe multifactorial spinal stenosis at L4-L5. These appear not significantly changed since 2018. IMPRESSION: CT THORACIC SPINE IMPRESSION 1. Acute appearing fractures of the posterior left 8th, 9th, 10th, and possibly 12 ribs. Superimposed numerous subacute and chronic rib fractures. 2. Associated small layering pleural effusion or hemothorax. Left lower lobe atelectasis. 3. Moderate volume retained or aspirated secretions in the trachea. 4. T2 vertebral body compression fracture and T1 spinous process fracture occurred between 04/22/2017 and 06/18/2017, and are stable since the Cervical spine CT on 06/18/2017. 5. Compression fractures of T4 and T5 are stable since 06/18/2017 and probably more longstanding. 6. Chronic T11 and T12 compression fractures are stable since 2018. CT LUMBAR SPINE IMPRESSION 1. A severe L3 compression fracture has progressed since 2018 and is age indeterminate. 2. Chronic L1, L2, and L5 compression fractures are stable since 2018. 3. Chronic multilevel degenerative lumbar spinal stenosis has not significantly changed since 2018 and is worst at the L4-L5 level. 4.  Aortic Atherosclerosis (ICD10-I70.0). 5. Chronic left renal atrophy, nephrolithiasis, and cholelithiasis. Electronically Signed   By: Odessa Fleming M.D.   On: 07/03/2017 08:48   Ct Lumbar Spine Wo Contrast  Result Date: 07/03/2017 CLINICAL DATA:  84 year old male status post fall while walking to the bathroom this morning at assisted living facility. Age indeterminate compression fractures seen on thoracic spine series 06/18/2017. EXAM: CT THORACIC AND LUMBAR SPINE WITHOUT CONTRAST TECHNIQUE: Multidetector CT imaging of the thoracic and lumbar  spine was performed without contrast. Multiplanar CT image reconstructions were also generated. COMPARISON:  Cervical spine CT 0631 hours today, 06/18/2017, 04/22/2017. CT Abdomen and Pelvis 09/06/2016. chest radiographs 10/21/2014. FINDINGS: CT THORACIC SPINE FINDINGS Segmentation: Normal.  Alignment: Mildly exaggerated thoracic kyphosis, stable since 06/18/2017. Vertebrae: Osteopenia. A comminuted fracture of the T1 spinous process was 1st demonstrated on the cervical spine CT 06/18/2017, and occurred since 04/22/2017. There is some periosteal new bone formation (series 6, image 21). This is a stable injury. The other T1 vertebra components appear stable and intact. T2 mild-to-moderate compression fracture is also new since 04/22/2017 but stable since 06/18/2017. Moderate T4 compression fracture is stable since 06/18/2017, new or progressed since 2016. Mild T5 compression fracture is stable since 06/18/2017, new or progressed since 2016. The T6 through T10 levels appear intact. Severe T11 and moderate to severe T12 compression fractures are stable since 2018. No significant bony retropulsion. There is a subacute appearing fracture of the left 8 rib costovertebral junction with periosteal new bone formation. There is a nearby unhealed posterior left 8th rib fracture with mild displacement on series 6, image 92. Mix of acute and chronic posterior left 9th rib fractures, with the acute appearing fracture mildly displaced on series 6, image 109. Mix of chronic and subacute posterior bilateral 10th rib fractures. Possible superimposed acute left posterolateral 10th rib fracture partially visible on series 6, image 134. Mix of subacute and chronic appearing bilateral 11th and 12th rib fractures. Possible superimposed acute posterior left 12th rib fracture on series 6, image 157. Paraspinal and other soft tissues: Small layering left pleural effusion with confluent left lower lobe atelectasis. Mild dependent pulmonary  atelectasis elsewhere. Moderate volume of aspirated or retained secretions in the trachea (series 6, image 63. No pericardial effusion. Calcified coronary artery and aortic atherosclerosis. The posterior thoracic paraspinal soft tissues appear normal. Disc levels: Congenital capacious thoracic spinal canal. No significant thoracic spinal stenosis suspected. CT LUMBAR SPINE FINDINGS Segmentation: Normal. Alignment: Stable since 2018. Chronic mild retrolisthesis of L5 on S1 with relatively preserved lumbar lordosis. Vertebrae: Osteopenia. Moderate chronic L1 compression fracture is stable since 2018. Mild chronic L2 inferior endplate compression is stable. Severe L3 compression fracture has progressed since 2018, the superior endplate is now compressed and was intact on the 2018 CT. The L4 vertebral body remains intact. Stable mild L5 compression fracture. No significant bony retropulsion. The visible sacrum and SI joints appear intact. Paraspinal and other soft tissues: Aortoiliac calcified atherosclerosis. Chronic bilateral nephrolithiasis. Chronic left renal atrophy. Chronic cholelithiasis. Diverticulosis of the descending colon with no active inflammation visible. The posterior paraspinal soft tissues appear within normal limits. Disc levels: Chronic multifactorial mild to moderate lumbar spinal stenosis L2-L3 and L3-L4. Chronic severe multifactorial spinal stenosis at L4-L5. These appear not significantly changed since 2018. IMPRESSION: CT THORACIC SPINE IMPRESSION 1. Acute appearing fractures of the posterior left 8th, 9th, 10th, and possibly 12 ribs. Superimposed numerous subacute and chronic rib fractures. 2. Associated small layering pleural effusion or hemothorax. Left lower lobe atelectasis. 3. Moderate volume retained or aspirated secretions in the trachea. 4. T2 vertebral body compression fracture and T1 spinous process fracture occurred between 04/22/2017 and 06/18/2017, and are stable since the Cervical  spine CT on 06/18/2017. 5. Compression fractures of T4 and T5 are stable since 06/18/2017 and probably more longstanding. 6. Chronic T11 and T12 compression fractures are stable since 2018. CT LUMBAR SPINE IMPRESSION 1. A severe L3 compression fracture has progressed since 2018 and is age indeterminate. 2. Chronic L1, L2, and L5 compression fractures are stable since 2018. 3. Chronic multilevel degenerative lumbar spinal stenosis has not significantly changed since 2018 and is worst at the L4-L5 level. 4.  Aortic Atherosclerosis (ICD10-I70.0). 5. Chronic  left renal atrophy, nephrolithiasis, and cholelithiasis. Electronically Signed   By: Odessa Fleming M.D.   On: 07/03/2017 08:48     Subjective: During my initial evaluation patient was slightly confused but was following basic commands.  He also try to get out of bed but when was asked to get back in the bed he did get back in the bed. When I saw patient about an hour later with family at bedside he was coherent and answering basic questions.  I extensively spoke with the patient's wife, daughter in person along with 2 of her daughters over the phone regarding his condition and all the questions were answered.  Family appeared satisfied in terms of care and all the questions that were answered.  They will follow-up with the primary care physician in 1 week.  They understand all the instructions that were given.  HEENT/EYES = negative for pain, redness, loss of vision, double vision, blurred vision, loss of hearing, sore throat, hoarseness, dysphagia Cardiovascular= negative for chest pain, palpitation, murmurs, lower extremity swelling Respiratory/lungs= negative for shortness of breath, cough, hemoptysis, wheezing, mucus production Gastrointestinal= negative for nausea, vomiting,, abdominal pain, melena, hematemesis Genitourinary= negative for Dysuria, Hematuria, Change in Urinary Frequency MSK = Negative for arthralgia, myalgias, Back Pain, Joint swelling   Neurology= Negative for headache, seizures, numbness, tingling  Psychiatry= Negative for anxiety, depression, suicidal and homocidal ideation Allergy/Immunology= Medication/Food allergy as listed  Skin= Negative for Rash, lesions, ulcers, itching   Discharge Exam: Vitals:   07/04/17 2113 07/05/17 0406  BP: (!) 174/68 (!) 176/91  Pulse: 89 93  Resp: 16 18  Temp: (!) 97.3 F (36.3 C) 98.2 F (36.8 C)  SpO2: 99%    Vitals:   07/04/17 0507 07/04/17 1228 07/04/17 2113 07/05/17 0406  BP: (!) 178/85 (!) 142/71 (!) 174/68 (!) 176/91  Pulse: 79 83 89 93  Resp: Temp: 97.7 F (36.5 C) 98.2 F (36.8 C) (!) 97.3 F (36.3 C) 98.2 F (36.8 C)  TempSrc: Oral Oral    SpO2: 97% 99% 99%   Weight:      Height:        General: Pt is alert, awake, not in acute distress, elderly frail-appearing. AAOX2 Cardiovascular: RRR, S1/S2 +, no rubs, no gallops Respiratory: CTA bilaterally, no wheezing, no rhonchi Abdominal: Soft, NT, ND, bowel sounds + Extremities: no edema, no cyanosis    The results of significant diagnostics from this hospitalization (including imaging, microbiology, ancillary and laboratory) are listed below for reference.     Microbiology: No results found for this or any previous visit (from the past 240 hour(s)).   Labs: BNP (last 3 results) No results for input(s): BNP in the last 8760 hours. Basic Metabolic Panel: Recent Labs  Lab 07/03/17 1001 07/04/17 0516 07/05/17 0453  NA 142 140 141  K 3.8 3.7 3.8  CL 109 110 107  CO2 GLUCOSE 115* 115* 124*  BUN 25* 21* 18  CREATININE 1.80* 1.69* 1.77*  CALCIUM 9.2 8.7* 9.2  MG  --   --  1.7   Liver Function Tests: Recent Labs  Lab 07/03/17 1001  AST 13*  ALT 11*  ALKPHOS 136*  BILITOT 1.0  PROT 5.7*  ALBUMIN 3.2*   No results for input(s): LIPASE, AMYLASE in the last 168 hours. No results for input(s): AMMONIA in the last 168 hours. CBC: Recent Labs  Lab 07/03/17 1001  07/04/17 0516 07/05/17 0453  WBC 8.3 7.7 10.2  NEUTROABS 5.6  --   --   HGB 10.7* 10.2* 11.1*  HCT 33.1* 32.0* 34.6*  MCV 90.7 89.6 89.4  PLT 219 213 246   Cardiac Enzymes: Recent Labs  Lab 07/03/17 1001  TROPONINI <0.03   BNP: Invalid input(s): POCBNP CBG: No results for input(s): GLUCAP in the last 168 hours. D-Dimer No results for input(s): DDIMER in the last 72 hours. Hgb A1c No results for input(s): HGBA1C in the last 72 hours. Lipid Profile No results for input(s): CHOL, HDL, LDLCALC, TRIG, CHOLHDL, LDLDIRECT in the last 72 hours. Thyroid function studies No results for input(s): TSH, T4TOTAL, T3FREE, THYROIDAB in the last 72 hours.  Invalid input(s): FREET3 Anemia work up No results for input(s): VITAMINB12, FOLATE, FERRITIN, TIBC, IRON, RETICCTPCT in the last 72 hours. Urinalysis    Component Value Date/Time   COLORURINE YELLOW 07/03/2017 1035   APPEARANCEUR CLEAR 07/03/2017 1035   LABSPEC 1.018 07/03/2017 1035   PHURINE 6.0 07/03/2017 1035   GLUCOSEU NEGATIVE 07/03/2017 1035   HGBUR NEGATIVE 07/03/2017 1035   BILIRUBINUR NEGATIVE 07/03/2017 1035   KETONESUR NEGATIVE 07/03/2017 1035   PROTEINUR NEGATIVE 07/03/2017 1035   NITRITE NEGATIVE 07/03/2017 1035   LEUKOCYTESUR NEGATIVE 07/03/2017 1035   Sepsis Labs Invalid input(s): PROCALCITONIN,  WBC,  LACTICIDVEN Microbiology No results found for this or any previous visit (from the past 240 hour(s)).   Time coordinating discharge:  I have spent 35 minutes face to face with the patient and on the ward discussing the patients care, assessment, plan and disposition with other care givers. >50% of the time was devoted counseling the patient about the risks and benefits of treatment/Discharge disposition and coordinating care.   SIGNED:   Dimple Nanas, MD  [facility] 07/05/2017, 1:25 PM Pager   If 7PM-7AM, please contact night-coverage [email or web address] Password [email or web address]

## 2017-07-05 NOTE — Progress Notes (Signed)
PT Cancellation Note  Patient Details Name: [name] MRN: [id number] DOB: 09-19-1922   Cancelled Treatment:     Attempted to work with patient to treat right sided BPPV today. Nursing requests we do not stir and agitate patient as he bas been confused and climbing out of bed at increased risk of falling. Agree that patient may require short term SNF placement. Will hold for now and re-attempt when appropriate.   Etta Grandchild, PT, DPT Acute Rehab Services Pager: [phone]     Etta Grandchild 07/05/2017, 8:46 AM

## 2017-07-06 LAB — BASIC METABOLIC PANEL
ANION GAP: 12 (ref 5–15)
BUN: 20 mg/dL (ref 6–20)
CALCIUM: 8.9 mg/dL (ref 8.9–10.3)
CO2: 19 mmol/L — AB (ref 22–32)
Chloride: 108 mmol/L (ref 101–111)
Creatinine, Ser: 1.83 mg/dL — ABNORMAL HIGH (ref 0.61–1.24)
GFR calc Af Amer: 35 mL/min — ABNORMAL LOW (ref >60)
GFR, EST NON AFRICAN AMERICAN: 30 mL/min — AB (ref >60)
GLUCOSE: 117 mg/dL — AB (ref 65–99)
Potassium: 3.9 mmol/L (ref 3.5–5.1)
Sodium: 139 mmol/L (ref 135–145)

## 2017-07-06 LAB — CBC
HCT: 34.1 % — ABNORMAL LOW (ref 39.0–52.0)
HEMOGLOBIN: 11 g/dL — AB (ref 13.0–17.0)
MCH: 28.7 pg (ref 26.0–34.0)
MCHC: 32.3 g/dL (ref 30.0–36.0)
MCV: 89 fL (ref 78.0–100.0)
Platelets: 202 10*3/uL (ref 150–400)
RBC: 3.83 MIL/uL — ABNORMAL LOW (ref 4.22–5.81)
RDW: 14.3 % (ref 11.5–15.5)
WBC: 9.4 10*3/uL (ref 4.0–10.5)

## 2017-07-06 LAB — MAGNESIUM: MAGNESIUM: 1.8 mg/dL (ref 1.7–2.4)

## 2017-07-06 NOTE — NC FL2 (Signed)
[place] MEDICAID FL2 LEVEL OF CARE SCREENING TOOL     IDENTIFICATION  Patient Name: [name] Birthdate: 12-27-1922 Sex: male Admission Date (Current Location): 07/03/2017  [facility] and [place] Number:  Producer, television/film/video and Address:  [facility], [address] [place], [place] [address]      Provider Number: [id number]  Attending Physician Name and Address:  Dimple Nanas, MD  Relative Name and Phone Number:  Vayden Weinand    Current Level of Care: Hospital Recommended Level of Care: Skilled Nursing Facility Prior Approval Number:    Date Approved/Denied:   PASRR Number:    Discharge Plan: SNF    Current Diagnoses: Patient Active Problem List   Diagnosis Date Noted  . Anemia 07/03/2017  . Fall 07/03/2017  . Falls 07/03/2017  . 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 Dyslipidemia 10/05/2015  . CAD -S/P LAD and CFX DES 10/26/14 10/25/2014  . Abnormal nuclear cardiac imaging test 10/23/2014  . Carotid bruit-Rt-dopplers OK 10/23/2014  . GERD (gastroesophageal reflux disease) 10/23/2014  . Diabetes (HCC) 10/23/2014  . Essential hypertension 10/22/2014  . Chronic renal disease, stage III (HCC) 10/22/2014  . 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 Anginal pain (HCC) 10/21/2014  . Lap Heller Myotomy Oct 2013 05/07/2012    Orientation RESPIRATION BLADDER Height & Weight     Self, Time, Situation, Place  Normal Incontinent Weight: 160 lb (72.6 kg) Height:   (177.8 cm)  BEHAVIORAL SYMPTOMS/MOOD NEUROLOGICAL BOWEL NUTRITION STATUS  (NA) (NA) Incontinent (NA)  AMBULATORY STATUS COMMUNICATION OF NEEDS Skin   Extensive Assist Verbally Normal                       Personal Care Assistance Level of Assistance  Bathing, Feeding, Dressing Bathing Assistance: Maximum assistance Feeding assistance: Maximum assistance Dressing Assistance: Maximum assistance     Functional Limitations Info   Hearing   Hearing Info: Impaired      SPECIAL CARE FACTORS FREQUENCY  (NA)                    Contractures Contractures Info: Not present    Additional Factors Info  (NA)               Current Medications (07/06/2017):  This is the current hospital active medication list Current Facility-Administered Medications  Medication Dose Route Frequency Provider Last Rate Last Dose  . acetaminophen (TYLENOL) tablet 650 mg  650 mg Oral Q6H PRN Marcos Eke, PA-C       Or  . acetaminophen (TYLENOL) suppository 650 mg  650 mg Rectal Q6H PRN Marcos Eke, PA-C      . atorvastatin (LIPITOR) tablet 5 mg  5 mg Oral QPM Marlowe Kays E, PA-C   5 mg at 07/05/17 1836  . bisacodyl (DULCOLAX) suppository 10 mg  10 mg Rectal Daily PRN Marcos Eke, PA-C      . docusate sodium (COLACE) capsule 100 mg  100 mg Oral BID PRN Marcos Eke, PA-C      . ferrous sulfate tablet 325 mg  325 mg Oral Q M,W,F Marcos Eke, PA-C   325 mg at 07/04/17 0954  . hydrALAZINE (APRESOLINE) injection 5 mg  5 mg Intravenous Q8H PRN Wertman, Sara E, PA-C      . lidocaine (LIDODERM) 5 % 1 patch  1 patch Transdermal Q24H Amin, Loura Halt, MD   1 patch at  07/05/17 1542  . ondansetron (ZOFRAN) tablet 4 mg  4 mg Oral Q6H PRN Marcos Eke, PA-C       Or  . ondansetron (ZOFRAN) injection 4 mg  4 mg Intravenous Q6H PRN Marcos Eke, PA-C      . pantoprazole (PROTONIX) EC tablet 40 mg  40 mg Oral Daily Marlowe Kays E, PA-C   40 mg at 07/05/17 1541  . senna-docusate (Senokot-S) tablet 1 tablet  1 tablet Oral QHS PRN Marcos Eke, PA-C         Discharge Medications: Please see discharge summary for a list of discharge medications.  Relevant Imaging Results:  Relevant Lab Results:   Additional Information SS# [id number]  Arvin Collard, [place]

## 2017-07-06 NOTE — Progress Notes (Signed)
[name] to be D/C'd Skilled nursing facility per MD order.  Discussed with the patient family and all questions fully answered.  IV catheter discontinued intact. Site without signs and symptoms of complications. Dressing and pressure applied.  An After Visit Summary was printed and given to the patient. Transport received prescription.  D/c education completed with patient/family including follow up instructions, medication list, d/c activities limitations if indicated, with other d/c instructions as indicated by MD - patient able to verbalize understanding, all questions fully answered.   Patient instructed to return to ED, call 911, or call MD for any changes in condition.   Patient transported via [facility].  Report called to [facility] [phone], [place].   Casper Harrison Jeramy Dimmick 07/06/2017 11:05 AM

## 2017-07-06 NOTE — Clinical Social Work Note (Signed)
Clinical Social Work Assessment  Patient Details  Name: [name] MRN: [id number] Date of Birth: 22-Mar-1922  Date of referral:  07/06/17               Reason for consult:  Facility Placement                Permission sought to share information with:  Facility Medical sales representative, Family Supports Permission granted to share information::  Yes, Verbal Permission Granted  Name::     Charity fundraiser::  yes  Relationship::  yes  Contact Information:  yes  Housing/Transportation Living arrangements for the past 2 months:  Market researcher of Information:  Adult Children, Spouse Patient Interpreter Needed:  None Criminal Activity/Legal Involvement Pertinent to Current Situation/Hospitalization:  No - Comment as needed Significant Relationships:  Adult Children, Spouse Lives with:  Spouse Do you feel safe going back to the place where you live?  Yes Need for family participation in patient care:  Yes (Comment)  Care giving concerns: CSW received consult regarding SNF placement.  CSW spoke with patient's spouse and daughter at bedside.  Patient currently resides at [facility].  Patient's spouse and daughter would like for patient to reside at SNF until stronger due to fall risk.  Patient will received PT services while at SNF.   Social Worker assessment / plan:  CSW spoke with patient's spouse and daughter at bedside regarding SNF placement.  Patient's spouse and daughter are agreeable for placement.  Employment status:  Retired Database administrator PT Recommendations:  Inpatient Rehab Consult Information / Referral to community resources:  (NA)  Patient/Family's Response to care:  Patient's spouse and daughter report agreement with discharge plan.  Patient/Family's Understanding of and Emotional Response to Diagnosis, Current Treatment, and Prognosis:  Patient's spouse and daughter are realistic regarding therapy  needs.  Patient;s spouse and daughter expressed understanding of CSW role and discharge process as well as medical condition.  No questions or concerns about plan or treatment.  Emotional Assessment Appearance:  Appears stated age Attitude/Demeanor/Rapport:  Unable to Assess Affect (typically observed):  Unable to Assess Orientation:  Oriented to Self, Oriented to Place, Oriented to  Time, Oriented to Situation Alcohol / Substance use:  Not Applicable Psych involvement (Current and /or in the community):  No (Comment)  Discharge Needs  Concerns to be addressed:  Other (Comment Required(Fall Risk (getting out of bed)) Readmission within the last 30 days:  Yes Current discharge risk:  Other(Fall Risk) Barriers to Discharge:  No Barriers Identified   Arvin Collard, LCSWA 07/06/2017, 10:04 AM

## 2017-07-06 NOTE — Progress Notes (Signed)
Patient will Discharge To: [facility] Anticipated DC Date:07/06/17 Family Notified: Deandrew, Hoe[place] (bedside) [phone] Transport [id number]   Per MD patient ready for DC to [facility] . RN, patient, patient's family, and facility notified of DC. Assessment, Fl2/Pasrr, and Discharge Summary sent to facility. RN given number for report [phone]). DC packet on chart. Ambulance transport requested for patient.   CSW signing off.  Budd Palmer LCSWA [phone]

## 2017-07-06 NOTE — Progress Notes (Signed)
PROGRESS NOTE    [name]  RUE:[id number] DOB: 1922/05/05 DOA: 07/03/2017 PCP: Merlene Laughter, MD   Brief Narrative:  84 year old with past medical history of essential hypertension, hyperlipidemia, CKD stage III, COPD, CAD status post stent placement in September 2016, hyperlipidemia, diabetes mellitus type 2, GERD, intracranial hemorrhage in March 2019 was admitted to the hospital with complains of dizziness.  Upon admission patient reported a fall, dizziness with change in position and decreased p.o. intake.  He states every time he turns his head or gets up from seated position he feels quite dizzy causing him to fall.  CT of the head was negative for any acute traumatic intracranial injury but showed mildly comminuted fracture of the posterior spinous process of T1, moderate cortical volume loss.  CT of the lumbar and thoracic spine showed acute fractures of the left eighth, ninth, 10th and possibly 12th rib.  Associated with small pleural effusion or hemothorax.  Compression fracture of T4/T5 which had been present previously, chronic T11 and T12 compression fracture.  Severe L3, chronic L1, L2 and L5 compression fracture which have all been present previously.  Patient was admitted to the hospital for further work-up.  Refer to discharge summary for details   Assessment & Plan:   Principal Problem:   Falls Active Problems:   Lap Heller Myotomy Oct 2013   Essential hypertension   Chronic renal disease, stage III (HCC)   DJD (degenerative joint disease), thoracic   GERD (gastroesophageal reflux disease)   Diabetes (HCC)   CAD -S/P LAD and CFX DES 10/26/14   Dyslipidemia   Traumatic intracerebral hemorrhage (HCC)   Anemia  Multiple traumatic falls-causing multiple rib fractures, stable Dizziness-autonomic dysfunction, stable Chronic thoracic and lumbar compression fractures -I highly suspect patient has underlying autonomic dysfunction on top of mild dehydration - Carotid  ultrasound done- 1-39% bilateral stenosis -Echocardiogram showed ejection fraction 55% with grade 1 diastolic dysfunction - Patient is orthostatic positive. compression stockings -Patient to go to skilled nursing facility  Coronary artery disease - Holding off on beta-blockers at the time of discharge  Chronic kidney disease stage III -Creatinine appears to be stable around baseline of 1.6  Essential hypertension -Discontinuing Norvasc 2.5 mg daily due to concerns of hypo-tension  Hyperlipidemia -On statin  Diabetes mellitus type 2 -Accu-Chek insulin sliding scale  GERD -Continue PPI  Anemia of chronic disease -Continue iron supplements  DVT prophylaxis: SCds Code Status: Full Code  Family Communication:   Bedside Disposition Plan: Discharge planning in place.  Consultants:   None    Antimicrobials:   None    Subjective: No complaints.  He is Resting.  Review of Systems Otherwise negative except as per HPI, including: HEENT/EYES = negative for pain, redness, loss of vision, double vision, blurred vision, loss of hearing, sore throat, hoarseness, dysphagia Cardiovascular= negative for chest pain, palpitation, murmurs, lower extremity swelling Respiratory/lungs= negative for shortness of breath, cough, hemoptysis, wheezing, mucus production Gastrointestinal= negative for nausea, vomiting,, abdominal pain, melena, hematemesis Genitourinary= negative for Dysuria, Hematuria, Change in Urinary Frequency MSK = Negative for arthralgia, myalgias, Back Pain, Joint swelling  Neurology= Negative for headache, seizures, numbness, tingling  Psychiatry= Negative for anxiety, depression, suicidal and homocidal ideation Allergy/Immunology= Medication/Food allergy as listed  Skin= Negative for Rash, lesions, ulcers, itching    Objective: Vitals:   07/05/17 1503 07/05/17 2312 07/06/17 0500 07/06/17 0633  BP: (!) 148/77 (!) 152/85  (!) 170/82  Pulse: 84 79  84  Resp: 20 15   14  Temp: 98 F (36.7 C) 98.5 F (36.9 C)  98.4 F (36.9 C)  TempSrc: Oral Oral  Oral  SpO2: 100% 94%  97%  Weight:   72.6 kg (160 lb)   Height:       No intake or output data in the 24 hours ending 07/06/17 0945 Filed Weights   07/03/17 1740 07/04/17 0100 07/06/17 0500  Weight: 65.3 kg (144 lb) 71.7 kg (158 lb) 72.6 kg (160 lb)    Examination: Constitutional: NAD, calm, comfortable, elderly frail-appearing, sleeping Eyes: PERRL, lids and conjunctivae normal ENMT: Mucous membranes are moist. Posterior pharynx clear of any exudate or lesions.Normal dentition.  Neck: normal, supple, no masses, no thyromegaly Respiratory: clear to auscultation bilaterally, no wheezing, no crackles. Normal respiratory effort. No accessory muscle use.  Cardiovascular: Regular rate and rhythm, no murmurs / rubs / gallops. No extremity edema. 2+ pedal pulses. No carotid bruits.  Abdomen: no tenderness, no masses palpated. No hepatosplenomegaly. Bowel sounds positive.  Musculoskeletal: no clubbing / cyanosis. No joint deformity upper and lower extremities. Good ROM, no contractures. Normal muscle tone.  Skin: Multiple areas of ecchymosis noted on his arms; mouth laceration with dressing in place Neurologic: CN 2-12 grossly intact. Sensation intact, DTR normal. Strength 4/5 in all 4.  Psychiatric: Normal judgment and insight. Alert and oriented x 1-2. Normal mood.       Data Reviewed:   CBC: Recent Labs  Lab 07/03/17 1001 07/04/17 0516 07/05/17 0453 07/06/17 0356  WBC 8.3 7.7 10.2 9.4  NEUTROABS 5.6  --   --   --   HGB 10.7* 10.2* 11.1* 11.0*  HCT 33.1* 32.0* 34.6* 34.1*  MCV 90.7 89.6 89.4 89.0  PLT 219 213 246 202   Basic Metabolic Panel: Recent Labs  Lab 07/03/17 1001 07/04/17 0516 07/05/17 0453 07/06/17 0356  NA 142 140 141 139  K 3.8 3.7 3.8 3.9  CL 109 110 107 108  CO2 19*  GLUCOSE 115* 115* 124* 117*  BUN 25* 21* 18 20  CREATININE 1.80* 1.69* 1.77* 1.83*  CALCIUM  9.2 8.7* 9.2 8.9  MG  --   --  1.7 1.8   GFR: Estimated Creatinine Clearance: 25.9 mL/min (A) (by C-G formula based on SCr of 1.83 mg/dL (H)). Liver Function Tests: Recent Labs  Lab 07/03/17 1001  AST 13*  ALT 11*  ALKPHOS 136*  BILITOT 1.0  PROT 5.7*  ALBUMIN 3.2*   No results for input(s): LIPASE, AMYLASE in the last 168 hours. No results for input(s): AMMONIA in the last 168 hours. Coagulation Profile: No results for input(s): INR, PROTIME in the last 168 hours. Cardiac Enzymes: Recent Labs  Lab 07/03/17 1001  TROPONINI <0.03   BNP (last 3 results) No results for input(s): PROBNP in the last 8760 hours. HbA1C: No results for input(s): HGBA1C in the last 72 hours. CBG: No results for input(s): GLUCAP in the last 168 hours. Lipid Profile: No results for input(s): CHOL, HDL, LDLCALC, TRIG, CHOLHDL, LDLDIRECT in the last 72 hours. Thyroid Function Tests: No results for input(s): TSH, T4TOTAL, FREET4, T3FREE, THYROIDAB in the last 72 hours. Anemia Panel: No results for input(s): VITAMINB12, FOLATE, FERRITIN, TIBC, IRON, RETICCTPCT in the last 72 hours. Sepsis Labs: No results for input(s): PROCALCITON, LATICACIDVEN in the last 168 hours.  No results found for this or any previous visit (from the past 240 hour(s)).       Radiology Studies: No results found.      Scheduled Meds: .  atorvastatin  5 mg Oral QPM  . ferrous sulfate  325 mg Oral Q M,W,F  . lidocaine  1 patch Transdermal Q24H  . pantoprazole  40 mg Oral Daily   Continuous Infusions:    LOS: 3 days    I have spent 35 minutes face to face with the patient and on the ward discussing the patients care, assessment, plan and disposition with other care givers. >50% of the time was devoted counseling the patient about the risks and benefits of treatment and coordinating care.     Ankit Joline Maxcy, MD [facility] Pager [phone]   If 7PM-7AM, please contact  night-coverage [email or web address] Password [email or web address] 07/06/2017, 9:45 AM

## 2017-07-23 ENCOUNTER — Other Ambulatory Visit: Payer: Self-pay | Admitting: Cardiovascular Disease

## 2017-08-17 ENCOUNTER — Observation Stay (HOSPITAL_COMMUNITY)
Admission: EM | Admit: 2017-08-17 | Discharge: 2017-08-19 | Disposition: A | Discharge status: Nursing Home (Includes ICF) | Payer: Medicare Other | Attending: Nephrology | Admitting: Nephrology

## 2017-08-17 ENCOUNTER — Emergency Department (HOSPITAL_COMMUNITY): Payer: Medicare Other

## 2017-08-17 DIAGNOSIS — L03115 Cellulitis of right lower limb: Secondary | ICD-10-CM | POA: Diagnosis not present

## 2017-08-17 DIAGNOSIS — I051 Rheumatic mitral insufficiency: Secondary | ICD-10-CM | POA: Diagnosis not present

## 2017-08-17 DIAGNOSIS — Z955 Presence of coronary angioplasty implant and graft: Secondary | ICD-10-CM | POA: Insufficient documentation

## 2017-08-17 DIAGNOSIS — D509 Iron deficiency anemia, unspecified: Secondary | ICD-10-CM | POA: Insufficient documentation

## 2017-08-17 DIAGNOSIS — N184 Chronic kidney disease, stage 4 (severe): Secondary | ICD-10-CM | POA: Insufficient documentation

## 2017-08-17 DIAGNOSIS — Z87891 Personal history of nicotine dependence: Secondary | ICD-10-CM | POA: Diagnosis not present

## 2017-08-17 DIAGNOSIS — M6281 Muscle weakness (generalized): Secondary | ICD-10-CM | POA: Diagnosis not present

## 2017-08-17 DIAGNOSIS — E1122 Type 2 diabetes mellitus with diabetic chronic kidney disease: Secondary | ICD-10-CM | POA: Diagnosis not present

## 2017-08-17 DIAGNOSIS — G319 Degenerative disease of nervous system, unspecified: Secondary | ICD-10-CM | POA: Diagnosis not present

## 2017-08-17 DIAGNOSIS — Z66 Do not resuscitate: Secondary | ICD-10-CM | POA: Diagnosis not present

## 2017-08-17 DIAGNOSIS — N183 Chronic kidney disease, stage 3 unspecified: Secondary | ICD-10-CM | POA: Diagnosis present

## 2017-08-17 DIAGNOSIS — I6523 Occlusion and stenosis of bilateral carotid arteries: Secondary | ICD-10-CM | POA: Insufficient documentation

## 2017-08-17 DIAGNOSIS — I130 Hypertensive heart and chronic kidney disease with heart failure and stage 1 through stage 4 chronic kidney disease, or unspecified chronic kidney disease: Secondary | ICD-10-CM | POA: Insufficient documentation

## 2017-08-17 DIAGNOSIS — I6782 Cerebral ischemia: Secondary | ICD-10-CM | POA: Insufficient documentation

## 2017-08-17 DIAGNOSIS — K219 Gastro-esophageal reflux disease without esophagitis: Secondary | ICD-10-CM | POA: Diagnosis not present

## 2017-08-17 DIAGNOSIS — R0989 Other specified symptoms and signs involving the circulatory and respiratory systems: Secondary | ICD-10-CM | POA: Diagnosis not present

## 2017-08-17 DIAGNOSIS — E785 Hyperlipidemia, unspecified: Secondary | ICD-10-CM | POA: Diagnosis present

## 2017-08-17 DIAGNOSIS — Z79899 Other long term (current) drug therapy: Secondary | ICD-10-CM | POA: Insufficient documentation

## 2017-08-17 DIAGNOSIS — I5031 Acute diastolic (congestive) heart failure: Secondary | ICD-10-CM | POA: Diagnosis not present

## 2017-08-17 DIAGNOSIS — I447 Left bundle-branch block, unspecified: Secondary | ICD-10-CM | POA: Diagnosis not present

## 2017-08-17 DIAGNOSIS — G9341 Metabolic encephalopathy: Secondary | ICD-10-CM | POA: Diagnosis not present

## 2017-08-17 DIAGNOSIS — I2510 Atherosclerotic heart disease of native coronary artery without angina pectoris: Secondary | ICD-10-CM | POA: Diagnosis not present

## 2017-08-17 DIAGNOSIS — Z96641 Presence of right artificial hip joint: Secondary | ICD-10-CM | POA: Insufficient documentation

## 2017-08-17 DIAGNOSIS — Z9889 Other specified postprocedural states: Secondary | ICD-10-CM | POA: Insufficient documentation

## 2017-08-17 DIAGNOSIS — D72829 Elevated white blood cell count, unspecified: Secondary | ICD-10-CM | POA: Diagnosis present

## 2017-08-17 DIAGNOSIS — Z8249 Family history of ischemic heart disease and other diseases of the circulatory system: Secondary | ICD-10-CM | POA: Insufficient documentation

## 2017-08-17 DIAGNOSIS — R0789 Other chest pain: Secondary | ICD-10-CM | POA: Diagnosis not present

## 2017-08-17 DIAGNOSIS — I10 Essential (primary) hypertension: Secondary | ICD-10-CM | POA: Diagnosis present

## 2017-08-17 DIAGNOSIS — Z7984 Long term (current) use of oral hypoglycemic drugs: Secondary | ICD-10-CM | POA: Diagnosis not present

## 2017-08-17 DIAGNOSIS — G9389 Other specified disorders of brain: Secondary | ICD-10-CM | POA: Insufficient documentation

## 2017-08-17 DIAGNOSIS — R55 Syncope and collapse: Principal | ICD-10-CM | POA: Insufficient documentation

## 2017-08-17 DIAGNOSIS — Z9861 Coronary angioplasty status: Secondary | ICD-10-CM

## 2017-08-17 DIAGNOSIS — D759 Disease of blood and blood-forming organs, unspecified: Secondary | ICD-10-CM

## 2017-08-17 DIAGNOSIS — R079 Chest pain, unspecified: Secondary | ICD-10-CM

## 2017-08-17 DIAGNOSIS — D649 Anemia, unspecified: Secondary | ICD-10-CM | POA: Diagnosis present

## 2017-08-17 LAB — CBC
HEMATOCRIT: 34 % — AB (ref 39.0–52.0)
Hemoglobin: 10.5 g/dL — ABNORMAL LOW (ref 13.0–17.0)
MCH: 28.7 pg (ref 26.0–34.0)
MCHC: 30.9 g/dL (ref 30.0–36.0)
MCV: 92.9 fL (ref 78.0–100.0)
Platelets: 185 10*3/uL (ref 150–400)
RBC: 3.66 MIL/uL — ABNORMAL LOW (ref 4.22–5.81)
RDW: 14.4 % (ref 11.5–15.5)
WBC: 17.9 10*3/uL — ABNORMAL HIGH (ref 4.0–10.5)

## 2017-08-17 LAB — I-STAT TROPONIN, ED: Troponin i, poc: 0 ng/mL (ref 0.00–0.08)

## 2017-08-17 LAB — BASIC METABOLIC PANEL
Anion gap: 7 (ref 5–15)
BUN: 38 mg/dL — AB (ref 8–23)
CO2: 25 mmol/L (ref 22–32)
CREATININE: 1.92 mg/dL — AB (ref 0.61–1.24)
Calcium: 9.1 mg/dL (ref 8.9–10.3)
Chloride: 107 mmol/L (ref 98–111)
GFR calc Af Amer: 33 mL/min — ABNORMAL LOW (ref >60)
GFR, EST NON AFRICAN AMERICAN: 28 mL/min — AB (ref >60)
GLUCOSE: 116 mg/dL — AB (ref 70–99)
POTASSIUM: 4.1 mmol/L (ref 3.5–5.1)
Sodium: 139 mmol/L (ref 135–145)

## 2017-08-17 NOTE — ED Provider Notes (Signed)
[facility] EMERGENCY DEPARTMENT Provider Note   CSN: [id number] Arrival date & time: 08/17/17  2145     History   Chief Complaint Chief Complaint  Patient presents with  . Chest Pain  . Near Syncope    HPI [name] is a 84 y.o. male.  HPI Patient was being helped to the bathroom and had an episode where he became less responsive.  Then after going to the bathroom had pain in his left chest that went to his left arm.  Severe.  Felt lightheaded and almost passed out at this time.  Rested and the pain went away.  Feels better now.  Has been doing well the last couple days.  No fevers.  No dysuria.  Has not been having chest pain besides   Tonight.  No shortness of breath.  No swelling in his legs.  Does have previous coronary artery disease with stents. Past Medical History:  Diagnosis Date  . Anemia   . Blood dyscrasia    ' free bleeder' per patient   . Coronary artery disease    3 coronary stents- followed by Dr Gwenlyn Saran   . Diabetes mellitus without complication (HCC)    type II   . Essential hypertension 10/22/2014  . GERD (gastroesophageal reflux disease)   . Multiple abrasions    on arms due to stumbling over chair on 11/19/16 per patient     Patient Active Problem List   Diagnosis Date Noted  . Anemia 07/03/2017  . Fall 07/03/2017  . Falls 07/03/2017  . 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 Dyslipidemia 10/05/2015  . CAD -S/P LAD and CFX DES 10/26/14 10/25/2014  . Abnormal nuclear cardiac imaging test 10/23/2014  . Carotid bruit-Rt-dopplers OK 10/23/2014  . GERD (gastroesophageal reflux disease) 10/23/2014  . Diabetes (HCC) 10/23/2014  . Essential hypertension 10/22/2014  . Chronic renal disease, stage III (HCC) 10/22/2014  . 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 Anginal pain (HCC) 10/21/2014  . Lap [facility] Myotomy Oct 2013 05/07/2012    Past Surgical History:  Procedure Laterality Date  . CARDIAC  CATHETERIZATION N/A 10/25/2014   Procedure: Left Heart Cath and Coronary Angiography;  Surgeon: Kathleene Hazel, MD;  Location: [facility] INVASIVE CV LAB;  Service: Cardiovascular;  Laterality: N/A;  . CARDIAC CATHETERIZATION N/A 10/26/2014   Procedure: Coronary Stent Intervention;  Surgeon: Kathleene Hazel, MD;  Location: [facility] INVASIVE CV LAB;  Service: Cardiovascular;  Laterality: N/A;  . ESOPHAGOGASTRODUODENOSCOPY (EGD) WITH PROPOFOL N/A 11/25/2016   Procedure: ESOPHAGOGASTRODUODENOSCOPY (EGD) WITH PROPOFOL;  Surgeon: Carman Ching, MD;  Location: [facility] ENDOSCOPY;  Service: Endoscopy;  Laterality: N/A;  . HELLER MYOTOMY  12/17/2011   Procedure: LAPAROSCOPIC HELLER MYOTOMY;  Surgeon: Valarie Merino, MD;  Location: [facility] ORS;  Service: General;  Laterality: N/A;  . HERNIA REPAIR  1952  . JOINT REPLACEMENT  2007   rt hip   . SAVORY DILATION N/A 11/25/2016   Procedure: SAVORY DILATION;  Surgeon: Carman Ching, MD;  Location: [facility] ENDOSCOPY;  Service: Endoscopy;  Laterality: N/A;  . UPPER GI ENDOSCOPY  12/17/2011   Procedure: UPPER GI ENDOSCOPY;  Surgeon: Valarie Merino, MD;  Location: [facility] ORS;  Service: General;  Laterality: N/A;        Home Medications    Prior to Admission medications   Medication Sig Start Date End Date Taking? Authorizing Provider  acetaminophen (TYLENOL) 325 MG tablet Take 650 mg by mouth every 6 (six) hours as needed for mild  pain, moderate pain, fever or headache.     [provider]  atorvastatin (LIPITOR) 10 MG tablet Take 5 mg by mouth every evening.  02/21/17   [provider]  docusate sodium (COLACE) 100 MG capsule Take 100 mg by mouth 2 (two) times daily as needed for mild constipation.    [provider]  ferrous sulfate 325 (65 FE) MG tablet Take 325 mg by mouth every Monday, Wednesday, and Friday.     [provider]  lidocaine (LIDODERM) 5 % Place 1 patch onto the skin daily. Remove & Discard patch within 12 hours or as  directed by MD 07/05/17   Dimple NanasAmin, Ankit Chirag, MD  metFORMIN (GLUCOPHAGE-XR) 500 MG 24 hr tablet Take 1 tablet (500 mg total) by mouth every morning. Patient taking differently: Take 500 mg by mouth every evening.  10/29/14   Abelino DerrickKilroy, Luke K, PA-C  multivitamin-lutein (OCUVITE-LUTEIN) CAPS capsule Take 1 capsule by mouth daily.    [provider]  nitroGLYCERIN (NITROSTAT) 0.4 MG SL tablet Place 0.4 mg under the tongue every 5 (five) minutes as needed for chest pain.    [provider]  omeprazole (PRILOSEC) 10 MG capsule Take 10 mg by mouth daily.    [provider]  oxycodone (OXY-IR) 5 MG capsule Take 1 capsule (5 mg total) by mouth every 4 (four) hours as needed (Severe Pain). 07/05/17   Amin, Loura HaltAnkit Chirag, MD  polyethylene glycol (MIRALAX) packet Take 17 g by mouth daily as needed for moderate constipation or severe constipation. 07/05/17   Amin, Loura HaltAnkit Chirag, MD  senna (SENOKOT) 8.6 MG TABS tablet Take 1 tablet (8.6 mg total) by mouth at bedtime as needed for mild constipation. 07/05/17   Dimple NanasAmin, Ankit Chirag, MD    Family History Family History  Problem Relation Age of Onset  . Heart disease Mother   . Heart disease Father     Social History Social History   Tobacco Use  . Smoking status: Former Games developermoker  . Smokeless tobacco: Never Used  . Tobacco comment: stopped 6726yrs ago  Substance Use Topics  . Alcohol use: No  . Drug use: No     Allergies   Patient has no known allergies.   Review of Systems Review of Systems  Constitutional: Negative for appetite change and fever.  HENT: Negative for congestion.   Respiratory: Negative for shortness of breath.   Cardiovascular: Positive for chest pain.  Gastrointestinal: Negative for abdominal pain.  Endocrine: Negative for polyuria.  Genitourinary: Negative for flank pain.  Musculoskeletal: Negative for back pain.  Neurological: Positive for syncope.  Hematological: Negative for adenopathy.     Physical  Exam Updated Vital Signs BP 127/60   Pulse 71   Temp 98.4 F (36.9 C) (Oral)   Resp (!) 22   SpO2 100%   Physical Exam  Constitutional: He appears well-developed.  HENT:  Head: Normocephalic.  Eyes: Pupils are equal, round, and reactive to light.  Neck: Neck supple.  Cardiovascular: Regular rhythm.  Pulmonary/Chest: Effort normal.  Musculoskeletal:       Right lower leg: He exhibits no edema.       Left lower leg: He exhibits no edema.  Neurological: He is alert.  Skin: Skin is warm. Capillary refill takes less than 2 seconds.  Psychiatric: He has a normal mood and affect.  Skin tear left elbow from previous fall.   ED Treatments / Results  Labs (all labs ordered are listed, but only abnormal results are displayed)  Labs Reviewed  BASIC METABOLIC PANEL - Abnormal; Notable for the following components:      Result Value   Glucose, Bld 116 (*)    BUN 38 (*)    Creatinine, Ser 1.92 (*)    GFR calc non Af Amer 28 (*)    GFR calc Af Amer 33 (*)    All other components within normal limits  CBC - Abnormal; Notable for the following components:   WBC 17.9 (*)    RBC 3.66 (*)    Hemoglobin 10.5 (*)    HCT 34.0 (*)    All other components within normal limits  I-STAT TROPONIN, ED    EKG EKG Interpretation  Date/Time:  Sunday August 17 2017 21:59:45 EDT Ventricular Rate:  75 PR Interval:    QRS Duration: 115 QT Interval:  379 QTC Calculation: 424 R Axis:   41 Text Interpretation:  Sinus rhythm Incomplete left bundle branch block Borderline ST elevation, anterior leads Baseline wander in lead(s) II III aVF Confirmed by Benjiman Core [phone]) on 08/17/2017 10:18:02 PM   Radiology Dg Chest 2 View  Result Date: 08/17/2017 CLINICAL DATA:  Chest pain and syncope EXAM: CHEST - 2 VIEW COMPARISON:  None. FINDINGS: Mild cardiomegaly with pulmonary vascular congestion but no overt pulmonary edema. No focal consolidation. No pleural effusion or pneumothorax. IMPRESSION: Mild  cardiomegaly and pulmonary vascular congestion. Electronically Signed   By: Deatra Robinson M.D.   On: 08/17/2017 22:34    Procedures Procedures (including critical care time)  Medications Ordered in ED Medications - No data to display   Initial Impression / Assessment and Plan / ED Course  I have reviewed the triage vital signs and the nursing notes.  Pertinent labs & imaging results that were available during my care of the patient were reviewed by me and considered in my medical decision making (see chart for details).     Patient with near syncope and chest pain.  Chest pain resolved.  Troponin negative but creatinine mildly increased from baseline.  White count is elevated however no fever.  With chest pain and syncope/near syncope and previous stents I feel patient would benefit from admission to the hospital.  Will discuss with hospitalist.  Final Clinical Impressions(s) / ED Diagnoses   Final diagnoses:  Chest pain, unspecified type  Near syncope    ED Discharge Orders    None       Benjiman Core, MD 08/17/17 2338

## 2017-08-17 NOTE — ED Triage Notes (Signed)
Pt BIB [facility] for near syncopal episode and chest pain. Pt was being helped to bed with at home aid when he became lethargic and had a sudden onset of chest pain and felt as if he were going to pass out. Pt is not currently having any pain. EMS administered no medications

## 2017-08-18 ENCOUNTER — Observation Stay (HOSPITAL_BASED_OUTPATIENT_CLINIC_OR_DEPARTMENT_OTHER): Payer: Medicare Other

## 2017-08-18 ENCOUNTER — Observation Stay (HOSPITAL_COMMUNITY): Payer: Medicare Other

## 2017-08-18 ENCOUNTER — Other Ambulatory Visit: Payer: Self-pay

## 2017-08-18 ENCOUNTER — Encounter (HOSPITAL_COMMUNITY): Payer: Self-pay | Admitting: Internal Medicine

## 2017-08-18 DIAGNOSIS — G9341 Metabolic encephalopathy: Secondary | ICD-10-CM | POA: Diagnosis present

## 2017-08-18 DIAGNOSIS — N183 Chronic kidney disease, stage 3 (moderate): Secondary | ICD-10-CM | POA: Diagnosis not present

## 2017-08-18 DIAGNOSIS — I2510 Atherosclerotic heart disease of native coronary artery without angina pectoris: Secondary | ICD-10-CM | POA: Diagnosis not present

## 2017-08-18 DIAGNOSIS — L03115 Cellulitis of right lower limb: Secondary | ICD-10-CM | POA: Diagnosis present

## 2017-08-18 DIAGNOSIS — R0789 Other chest pain: Secondary | ICD-10-CM | POA: Diagnosis not present

## 2017-08-18 DIAGNOSIS — R079 Chest pain, unspecified: Secondary | ICD-10-CM | POA: Diagnosis not present

## 2017-08-18 DIAGNOSIS — R55 Syncope and collapse: Secondary | ICD-10-CM

## 2017-08-18 DIAGNOSIS — I5031 Acute diastolic (congestive) heart failure: Secondary | ICD-10-CM | POA: Diagnosis not present

## 2017-08-18 DIAGNOSIS — I130 Hypertensive heart and chronic kidney disease with heart failure and stage 1 through stage 4 chronic kidney disease, or unspecified chronic kidney disease: Secondary | ICD-10-CM | POA: Diagnosis not present

## 2017-08-18 DIAGNOSIS — D759 Disease of blood and blood-forming organs, unspecified: Secondary | ICD-10-CM | POA: Insufficient documentation

## 2017-08-18 DIAGNOSIS — E785 Hyperlipidemia, unspecified: Secondary | ICD-10-CM | POA: Diagnosis not present

## 2017-08-18 DIAGNOSIS — D72829 Elevated white blood cell count, unspecified: Secondary | ICD-10-CM | POA: Diagnosis present

## 2017-08-18 DIAGNOSIS — I10 Essential (primary) hypertension: Secondary | ICD-10-CM | POA: Diagnosis not present

## 2017-08-18 LAB — BASIC METABOLIC PANEL
Anion gap: 3 — ABNORMAL LOW (ref 5–15)
BUN: 36 mg/dL — ABNORMAL HIGH (ref 8–23)
CHLORIDE: 107 mmol/L (ref 98–111)
CO2: 27 mmol/L (ref 22–32)
Calcium: 8.9 mg/dL (ref 8.9–10.3)
Creatinine, Ser: 1.96 mg/dL — ABNORMAL HIGH (ref 0.61–1.24)
GFR, EST AFRICAN AMERICAN: 32 mL/min — AB (ref >60)
GFR, EST NON AFRICAN AMERICAN: 28 mL/min — AB (ref >60)
Glucose, Bld: 106 mg/dL — ABNORMAL HIGH (ref 70–99)
Potassium: 3.7 mmol/L (ref 3.5–5.1)
SODIUM: 137 mmol/L (ref 135–145)

## 2017-08-18 LAB — GLUCOSE, CAPILLARY
GLUCOSE-CAPILLARY: 89 mg/dL (ref 70–99)
GLUCOSE-CAPILLARY: 77 mg/dL (ref 70–99)
Glucose-Capillary: 90 mg/dL (ref 70–99)
Glucose-Capillary: 130 mg/dL — ABNORMAL HIGH (ref 70–99)

## 2017-08-18 LAB — TROPONIN I
Troponin I: <0.03 ng/mL (ref <0.03)
Troponin I: <0.03 ng/mL (ref <0.03)
Troponin I: <0.03 ng/mL (ref <0.03)

## 2017-08-18 LAB — URINALYSIS, ROUTINE W REFLEX MICROSCOPIC
Bacteria, UA: NONE SEEN
Bilirubin Urine: NEGATIVE
GLUCOSE, UA: 50 mg/dL — AB
Ketones, ur: NEGATIVE mg/dL
LEUKOCYTES UA: NEGATIVE
Nitrite: NEGATIVE
PH: 5 (ref 5.0–8.0)
Protein, ur: NEGATIVE mg/dL
RBC / HPF: >50 RBC/hpf — ABNORMAL HIGH (ref 0–5)
Specific Gravity, Urine: 1.02 (ref 1.005–1.030)

## 2017-08-18 LAB — CBC
HEMATOCRIT: 31.6 % — AB (ref 39.0–52.0)
Hemoglobin: 9.9 g/dL — ABNORMAL LOW (ref 13.0–17.0)
MCH: 28.9 pg (ref 26.0–34.0)
MCHC: 31.3 g/dL (ref 30.0–36.0)
MCV: 92.4 fL (ref 78.0–100.0)
PLATELETS: 168 10*3/uL (ref 150–400)
RBC: 3.42 MIL/uL — AB (ref 4.22–5.81)
RDW: 14.4 % (ref 11.5–15.5)
WBC: 14.1 10*3/uL — AB (ref 4.0–10.5)

## 2017-08-18 LAB — HEMOGLOBIN A1C
Hgb A1c MFr Bld: 5.6 % (ref 4.8–5.6)
Mean Plasma Glucose: 114.02 mg/dL

## 2017-08-18 LAB — BRAIN NATRIURETIC PEPTIDE: B Natriuretic Peptide: 62.4 pg/mL (ref 0.0–100.0)

## 2017-08-18 LAB — CBG MONITORING, ED: Glucose-Capillary: 93 mg/dL (ref 70–99)

## 2017-08-18 LAB — LIPID PANEL
Cholesterol: 97 mg/dL (ref 0–200)
HDL: 46 mg/dL (ref >40)
LDL CALC: 40 mg/dL (ref 0–99)
TRIGLYCERIDES: 53 mg/dL (ref <150)
Total CHOL/HDL Ratio: 2.1 RATIO
VLDL: 11 mg/dL (ref 0–40)

## 2017-08-18 LAB — SEDIMENTATION RATE: Sed Rate: 23 mm/hr — ABNORMAL HIGH (ref 0–16)

## 2017-08-18 LAB — ECHOCARDIOGRAM COMPLETE: Weight: 2536.17 oz

## 2017-08-18 LAB — LACTIC ACID, PLASMA
Lactic Acid, Venous: 1.6 mmol/L (ref 0.5–1.9)
Lactic Acid, Venous: 1.4 mmol/L (ref 0.5–1.9)

## 2017-08-18 LAB — C-REACTIVE PROTEIN: CRP: 2.2 mg/dL — AB (ref <1.0)

## 2017-08-18 LAB — PROCALCITONIN: PROCALCITONIN: 0.15 ng/mL

## 2017-08-18 MED ORDER — PROSIGHT PO TABS
1.0000 | ORAL_TABLET | Freq: Every day | ORAL | Status: DC
  Administered 2017-08-18 – 2017-08-19 (×2): 1 via ORAL
  Filled 2017-08-18 (×2): qty 1

## 2017-08-18 MED ORDER — DOCUSATE SODIUM 100 MG PO CAPS: 100.0000 mg | ORAL_CAPSULE | Freq: Two times a day (BID) | ORAL | Status: DC | PRN

## 2017-08-18 MED ORDER — SENNA 8.6 MG PO TABS: 1.0000 | ORAL_TABLET | Freq: Every evening | ORAL | Status: DC | PRN

## 2017-08-18 MED ORDER — FERROUS SULFATE 325 (65 FE) MG PO TABS
325.0000 mg | ORAL_TABLET | ORAL | Status: DC
  Administered 2017-08-18: 325 mg via ORAL
  Filled 2017-08-18: qty 1

## 2017-08-18 MED ORDER — ONDANSETRON HCL 4 MG/2ML IJ SOLN: 4.0000 mg | Freq: Four times a day (QID) | INTRAMUSCULAR | Status: DC | PRN

## 2017-08-18 MED ORDER — FUROSEMIDE 10 MG/ML IJ SOLN
40.0000 mg | Freq: Three times a day (TID) | INTRAMUSCULAR | Status: DC
  Administered 2017-08-18 – 2017-08-19 (×3): 40 mg via INTRAVENOUS
  Filled 2017-08-18 (×3): qty 4

## 2017-08-18 MED ORDER — INSULIN ASPART 100 UNIT/ML ~~LOC~~ SOLN: 0.0000 [IU] | Freq: Every day | SUBCUTANEOUS | Status: DC

## 2017-08-18 MED ORDER — ZOLPIDEM TARTRATE 5 MG PO TABS: 5.0000 mg | ORAL_TABLET | Freq: Every evening | ORAL | Status: DC | PRN

## 2017-08-18 MED ORDER — OXYCODONE HCL 5 MG PO TABS: 5.0000 mg | ORAL_TABLET | ORAL | Status: DC | PRN

## 2017-08-18 MED ORDER — FUROSEMIDE 10 MG/ML IJ SOLN: 60.0000 mg | Freq: Two times a day (BID) | INTRAMUSCULAR | Status: DC

## 2017-08-18 MED ORDER — INSULIN ASPART 100 UNIT/ML ~~LOC~~ SOLN: 0.0000 [IU] | Freq: Three times a day (TID) | SUBCUTANEOUS | Status: DC

## 2017-08-18 MED ORDER — SODIUM CHLORIDE 0.9 % IV SOLN
1.0000 g | INTRAVENOUS | Status: DC
  Administered 2017-08-18: 1 g via INTRAVENOUS
  Filled 2017-08-18: qty 10

## 2017-08-18 MED ORDER — POLYETHYLENE GLYCOL 3350 17 G PO PACK
17.0000 g | PACK | Freq: Every day | ORAL | Status: DC | PRN
Start: 2017-08-18 — End: 2017-08-19

## 2017-08-18 MED ORDER — NITROGLYCERIN 0.4 MG SL SUBL: 0.4000 mg | SUBLINGUAL_TABLET | SUBLINGUAL | Status: DC | PRN

## 2017-08-18 MED ORDER — HYDRALAZINE HCL 20 MG/ML IJ SOLN: 5.0000 mg | INTRAMUSCULAR | Status: DC | PRN

## 2017-08-18 MED ORDER — ACETAMINOPHEN 325 MG PO TABS: 650.0000 mg | ORAL_TABLET | Freq: Four times a day (QID) | ORAL | Status: DC | PRN

## 2017-08-18 MED ORDER — PANTOPRAZOLE SODIUM 40 MG PO TBEC
40.0000 mg | DELAYED_RELEASE_TABLET | Freq: Every day | ORAL | Status: DC
  Administered 2017-08-18 – 2017-08-19 (×2): 40 mg via ORAL
  Filled 2017-08-18 (×2): qty 1

## 2017-08-18 MED ORDER — ATORVASTATIN CALCIUM 10 MG PO TABS
5.0000 mg | ORAL_TABLET | Freq: Every evening | ORAL | Status: DC
  Administered 2017-08-18: 5 mg via ORAL
  Filled 2017-08-18: qty 1

## 2017-08-18 MED ORDER — LIDOCAINE 5 % EX PTCH
1.0000 | MEDICATED_PATCH | CUTANEOUS | Status: DC
  Filled 2017-08-18 (×2): qty 1

## 2017-08-18 MED ORDER — ONDANSETRON HCL 4 MG PO TABS: 4.0000 mg | ORAL_TABLET | Freq: Four times a day (QID) | ORAL | Status: DC | PRN

## 2017-08-18 NOTE — H&P (Signed)
History and Physical    [name] DOB: [birth year] DOA: 08/17/2017  Referring MD/NP/PA:   PCP: Lajean Manes, MD   Patient coming from:  The patient is coming from home.  At baseline, pt is independent for most of ADL.   Chief Complaint: chest pain and near syncope  HPI: [name] is a 84 y.o. male with medical history significant of hypertension, hyperlipidemia, diabetes mellitus, GERD, iron deficiency anemia, CAD, stent placement, blood dyscrasias, carotid artery stenosis, dCHF, who presents with chest pain and near syncope.  Pt is lethargic and has poor hearing. He is a very poor historian.  History is limited.  Per report, patient had one episode of chest pain,which is located in the left side of chest, radiating to the left arm.  Patient felt lightheadedness and almost passed out, but did not.  He was helped to the bed. When I saw pt in ED, he is lethargic, knows his own name, but not oriented to the place and time.  He denies any chest pain, shortness breath currently.  He moves all extremities, no active cough, respiratory distress, nausea vomiting, diarrhea noted. He has bilateral leg edema.  Not sure if patient has any symptoms of a UTI.  ED Course: pt was found to have WBC 17.9, negative UA, slightly worsening renal function, temperature normal, no tachycardia, oxygen saturation 95% on room air.  Chest x-ray showed mild cardiomegaly, vascular congestion.  Patient is placed on telemetry bed of observation.  Review of Systems: Could not be reviewed accurately since patient is very lethargic.  Allergy: No Known Allergies  Past Medical History:  Diagnosis Date  . Anemia   . Blood dyscrasia    ' free bleeder' per patient   . Coronary artery disease    3 coronary stents- followed by Dr Loleta Chance   . Diabetes mellitus without complication ([place])    type II   . Essential hypertension 10/22/2014  . GERD (gastroesophageal reflux disease)   . Multiple abrasions      on arms due to stumbling over chair on 11/19/16 per patient     Past Surgical History:  Procedure Laterality Date  . CARDIAC CATHETERIZATION N/A 10/25/2014   Procedure: Left Heart Cath and Coronary Angiography;  Surgeon: Burnell Blanks, MD;  Location: [place] [facility];  Service: Cardiovascular;  Laterality: N/A;  . CARDIAC CATHETERIZATION N/A 10/26/2014   Procedure: Coronary Stent Intervention;  Surgeon: Burnell Blanks, MD;  Location: [place] [facility];  Service: Cardiovascular;  Laterality: N/A;  . ESOPHAGOGASTRODUODENOSCOPY (EGD) WITH PROPOFOL N/A 11/25/2016   Procedure: ESOPHAGOGASTRODUODENOSCOPY (EGD) WITH PROPOFOL;  Surgeon: Laurence Spates, MD;  Location: [facility];  Service: Endoscopy;  Laterality: N/A;  . HELLER MYOTOMY  12/17/2011   Procedure: LAPAROSCOPIC HELLER MYOTOMY;  Surgeon: Pedro Earls, MD;  Location: [facility];  Service: General;  Laterality: N/A;  . [place]  . JOINT REPLACEMENT  2007   rt hip   . SAVORY DILATION N/A 11/25/2016   Procedure: SAVORY DILATION;  Surgeon: Laurence Spates, MD;  Location: [facility];  Service: Endoscopy;  Laterality: N/A;  . UPPER GI ENDOSCOPY  12/17/2011   Procedure: UPPER GI ENDOSCOPY;  Surgeon: Pedro Earls, MD;  Location: [facility];  Service: General;  Laterality: N/A;    Social History:  reports that he has quit smoking. He has never used smokeless tobacco. He reports that he does not drink alcohol or use drugs.  Family History:  Family History  Problem Relation Age of Onset  . Heart disease Mother   . Heart disease Father      Prior to Admission medications   Medication Sig Start Date End Date Taking? Authorizing Provider  acetaminophen (TYLENOL) 325 MG tablet Take 650 mg by mouth every 6 (six) hours as needed for mild pain, moderate pain, fever or headache.     [provider]  atorvastatin (LIPITOR) 10 MG tablet Take 5 mg by mouth every evening.  02/21/17   [provider]   docusate sodium (COLACE) 100 MG capsule Take 100 mg by mouth 2 (two) times daily as needed for mild constipation.    [provider]  ferrous sulfate 325 (65 FE) MG tablet Take 325 mg by mouth every Monday, Wednesday, and Friday.     [provider]  lidocaine (LIDODERM) 5 % Place 1 patch onto the skin daily. Remove & Discard patch within 12 hours or as directed by MD 07/05/17   Damita Lack, MD  metFORMIN (GLUCOPHAGE-XR) 500 MG 24 hr tablet Take 1 tablet (500 mg total) by mouth every morning. Patient taking differently: Take 500 mg by mouth every evening.  10/29/14   Erlene Quan, PA-C  multivitamin-lutein (OCUVITE-LUTEIN) CAPS capsule Take 1 capsule by mouth daily.    [provider]  nitroGLYCERIN (NITROSTAT) 0.4 MG SL tablet Place 0.4 mg under the tongue every 5 (five) minutes as needed for chest pain.    [provider]  omeprazole (PRILOSEC) 10 MG capsule Take 10 mg by mouth daily.    [provider]  oxycodone (OXY-IR) 5 MG capsule Take 1 capsule (5 mg total) by mouth every 4 (four) hours as needed (Severe Pain). 07/05/17   Amin, Jeanella Flattery, MD  polyethylene glycol (MIRALAX) packet Take 17 g by mouth daily as needed for moderate constipation or severe constipation. 07/05/17   Amin, Jeanella Flattery, MD  senna (SENOKOT) 8.6 MG TABS tablet Take 1 tablet (8.6 mg total) by mouth at bedtime as needed for mild constipation. 07/05/17   Damita Lack, MD    Physical Exam: Vitals:   08/17/17 2345 08/18/17 0045 08/18/17 0115 08/18/17 0200  BP: (!) 103/50 (!) 123/56 (!) 134/59 134/60  Pulse: 73 69  64  Resp: _0 Temp:      TempSrc:      SpO2: 96% 98%  100%   General: Not in acute distress HEENT:       Eyes: PERRL, EOMI, no scleral icterus.       ENT: No discharge from the ears and nose, no pharynx injection, no tonsillar enlargement.        Neck: No JVD, no bruit, no mass felt. Heme: No neck lymph node enlargement. Cardiac: S1/S2,  RRR, No murmurs, No gallops or rubs. Respiratory:  No rales, wheezing, rhonchi or rubs. GI: Soft, nondistended, nontender, no rebound pain, no organomegaly, BS present. GU: No hematuria Ext: has leg edema bilaterally, right leg is erythematous, mildly warm and also tender. 2+DP/PT pulse bilaterally. Musculoskeletal: No joint deformities, No joint redness or warmth, no limitation of ROM in spin. Skin: No rashes.  Neuro: lethargic, knows his own name, but not oriented to place and time, cranial nerves II-XII grossly intact, moves all extremities. Psych: Patient is not psychotic.   Labs on Admission: I have personally reviewed following labs and imaging studies  CBC: Recent Labs  Lab 08/17/17 2205  WBC 17.9*  HGB 10.5*  HCT 34.0*  MCV 92.9  PLT 932   Basic Metabolic Panel: Recent Labs  Lab 08/17/17 2205  NA 139  K 4.1  CL 107  CO2 25  GLUCOSE 116*  BUN 38*  CREATININE 1.92*  CALCIUM 9.1   GFR: CrCl cannot be calculated (Unknown ideal weight.). Liver Function Tests: No results for input(s): AST, ALT, ALKPHOS, BILITOT, PROT, ALBUMIN in the last 168 hours. No results for input(s): LIPASE, AMYLASE in the last 168 hours. No results for input(s): AMMONIA in the last 168 hours. Coagulation Profile: No results for input(s): INR, PROTIME in the last 168 hours. Cardiac Enzymes: No results for input(s): CKTOTAL, CKMB, CKMBINDEX, TROPONINI in the last 168 hours. BNP (last 3 results) No results for input(s): PROBNP in the last 8760 hours. HbA1C: No results for input(s): HGBA1C in the last 72 hours. CBG: Recent Labs  Lab 08/18/17 0152  GLUCAP 93   Lipid Profile: No results for input(s): CHOL, HDL, LDLCALC, TRIG, CHOLHDL, LDLDIRECT in the last 72 hours. Thyroid Function Tests: No results for input(s): TSH, T4TOTAL, FREET4, T3FREE, THYROIDAB in the last 72 hours. Anemia Panel: No results for input(s): VITAMINB12, FOLATE, FERRITIN, TIBC, IRON, RETICCTPCT in the last 72  hours. Urine analysis:    Component Value Date/Time   COLORURINE YELLOW 08/17/2017 2339   APPEARANCEUR HAZY (A) 08/17/2017 2339   LABSPEC 1.020 08/17/2017 2339   PHURINE 5.0 08/17/2017 2339   GLUCOSEU 50 (A) 08/17/2017 2339   HGBUR LARGE (A) 08/17/2017 2339   BILIRUBINUR NEGATIVE 08/17/2017 2339   KETONESUR NEGATIVE 08/17/2017 2339   PROTEINUR NEGATIVE 08/17/2017 2339   NITRITE NEGATIVE 08/17/2017 2339   LEUKOCYTESUR NEGATIVE 08/17/2017 2339   Sepsis Labs: _0 (procalcitonin:4,lacticidven:4) )No results found for this or any previous visit (from the past 240 hour(s)).   Radiological Exams on Admission: Dg Chest 2 View  Result Date: 08/17/2017 CLINICAL DATA:  Chest pain and syncope EXAM: CHEST - 2 VIEW COMPARISON:  None. FINDINGS: Mild cardiomegaly with pulmonary vascular congestion but no overt pulmonary edema. No focal consolidation. No pleural effusion or pneumothorax. IMPRESSION: Mild cardiomegaly and pulmonary vascular congestion. Electronically Signed   By: Ulyses Jarred M.D.   On: 08/17/2017 22:34   Ct Head Wo Contrast  Result Date: 08/18/2017 CLINICAL DATA:  84 year old male with syncope. EXAM: CT HEAD WITHOUT CONTRAST TECHNIQUE: Contiguous axial images were obtained from the base of the skull through the vertex without intravenous contrast. COMPARISON:  Head CT dated 07/03/2017 FINDINGS: Brain: There is mild to moderate age-related atrophy and chronic microvascular ischemic changes. Focal area of old infarct and encephalomalacia the left frontal lobe. There is no acute intracranial hemorrhage. No mass effect or midline shift. No extra-axial fluid collection. Vascular: No hyperdense vessel or unexpected calcification. Skull: Normal. Negative for fracture or focal lesion. Sinuses/Orbits: No acute finding. Other: None IMPRESSION: 1. No acute intracranial hemorrhage. 2. Age-related atrophy and chronic microvascular ischemic changes. Electronically Signed   By: Anner Crete  M.D.   On: 08/18/2017 02:01     EKG: Independently reviewed.  Sinus rhythm, QTC 424, old incomplete right bundle blockade, Q waves in lead III,  Assessment/Plan Principal Problem:   Chest pain Active Problems:   Essential hypertension   Chronic renal disease, stage III (HCC)   GERD (gastroesophageal reflux disease)   CAD -S/P LAD and CFX DES 10/26/14   Dyslipidemia   Anemia   Near syncope   Leukocytosis   Acute metabolic encephalopathy   Cellulitis of right leg   Chest pain and CAD: s/p of DED 2016. pt  had one episode of chest pain at home, currently chest pain-free.  Patient had near syncope episode, potential differential diagnosis is PE, but the patient does not have oxygen desaturation, no shortness of breath, currently CP free, in addition, he has hx of blood dyscrasias and easy bleeding, therefore I has low patient for PE.  Patient may have demand ischemia secondary to ongoing infection (possible right leg cellulitis).  - will admit to tele bed as inpt - will place on Tele bed for obs - cycle CE q6 x3 and repeat EKG in the am  - prn Nitroglycerin, Morphine, and lipitor  - No ASA due to hx of blood dyscrasias - Risk factor stratification: will check FLP and A1C  - 2d echo  Leukocytosis and possible right eg cellulitis: WBC 17.9. UA negative. No fever. Pt has right leg swelling, erythema, mild warmth and tenderness indicating possible cellulitis. - Empiric antimicrobial treatment with IV rocephin - Blood cultures x 2  - ESR and CRP - will get Procalcitonin and trend lactic acid levels - LE doppler to r/o DVT  Near syncope and acute metabolic encephalopathy: Etiology is not clear.  Likely multifactorial etiology, including chest pain, ongoing infection of leg cellulitis and history of carotid artery stenosis. No focal neurologic findings on physical examination. -Check orthostatic status - CT head - Carotid artery Doppler - swallowing screen test  HTN: bp 107/50. Not  taking meds at home -PRN hydralazine  Chronic renal disease, stage III ([place]): Slightly worsened in the baseline.  Baseline creatinine 1.6-1.8, his creatinine is 1.92, BUN 38. UA negative -monitoring renal fx by BMP -will not give IVF since pt has vascular congestion on chest x-ray and bilateral leg edema.  GERD: -Protonix  HLD: -lipiotor  Anemia: Hgb stable, 10.5 -continue iron supplement.   DVT ppx: SCD only to left leg (has hx of blood dyscrasias, easy bleeding). Need to r/o DVT in right leg Code Status: Full code Family Communication: None at bed side.     Disposition Plan:  Anticipate discharge back to previous home environment Consults called:  none Admission status: Obs / tele   Date of Service 08/18/2017    Ivor Costa [facility] Pager [phone]  If 7PM-7AM, please contact night-coverage [email or web address] Password [email or web address] 08/18/2017, 2:22 AM

## 2017-08-18 NOTE — Progress Notes (Signed)
RN changed pt linens and placed new condom catheter.

## 2017-08-18 NOTE — Progress Notes (Addendum)
Saw and examined patient, no charge as H&P was after MN.    ECHO done, LVEF 55% no severe valve disease.  Pt on exam has sig LE edema and CXR w/ vasc congestion.  Having some SOB episodes which brought him to ED.  CP resolved and trop's negative.  Pt has CKD stage IV as well w/ stable creatinine. Believe vol overload may be an issue, will start IV lasix and see how he feels tomorrow. F/B Dr Royann Shiversroitoru, will touch base w/ him tomorrow if possible.   Also dealing w/ memory loss issues, mild at this point, also functional decline w/ falls at home, requires an aid and a walker at home for mostly all activities of daily life.  Pt requesting DNR status.    Is on metformin at home[facility] will dc S[facility].    Vinson Moselleob Fong Mccarry MD [facility] pgr [phone] 07/13/2017, 9:25 AM

## 2017-08-18 NOTE — Progress Notes (Addendum)
VASCULAR LAB PRELIMINARY  PRELIMINARY  PRELIMINARY  PRELIMINARY  Carotid duplex completed.    Preliminary report: 1-39% ICA plaquing.  Vertebral artery flow is antegrade.   Aisley Whan, RVT 08/18/2017, 5:57 PM

## 2017-08-18 NOTE — Progress Notes (Addendum)
RN rounded on pt. Pt requests to be repositioned in bed. RN adjusted pt up in bed so pt could eat dinner. Pt states he does not need anything else at this time.

## 2017-08-18 NOTE — Plan of Care (Signed)
  Problem: Safety: Goal: Ability to remain free from injury will improve Outcome: Progressing   

## 2017-08-18 NOTE — Progress Notes (Signed)
  Echocardiogram 2D Echocardiogram has been performed.  Mike SavoyCasey N Zaidy Ochoa 08/18/2017, 12:16 PM

## 2017-08-18 NOTE — Progress Notes (Addendum)
RN placed pt's hearing aids in pink container with pt's label on it. Container is sitting on pt's bedside table. Pt is aware.

## 2017-08-19 DIAGNOSIS — I5031 Acute diastolic (congestive) heart failure: Secondary | ICD-10-CM

## 2017-08-19 DIAGNOSIS — I10 Essential (primary) hypertension: Secondary | ICD-10-CM

## 2017-08-19 DIAGNOSIS — N184 Chronic kidney disease, stage 4 (severe): Secondary | ICD-10-CM

## 2017-08-19 DIAGNOSIS — Z9861 Coronary angioplasty status: Secondary | ICD-10-CM

## 2017-08-19 DIAGNOSIS — G9341 Metabolic encephalopathy: Secondary | ICD-10-CM | POA: Diagnosis not present

## 2017-08-19 DIAGNOSIS — R55 Syncope and collapse: Secondary | ICD-10-CM

## 2017-08-19 DIAGNOSIS — I2510 Atherosclerotic heart disease of native coronary artery without angina pectoris: Secondary | ICD-10-CM

## 2017-08-19 DIAGNOSIS — R079 Chest pain, unspecified: Secondary | ICD-10-CM

## 2017-08-19 LAB — BASIC METABOLIC PANEL
Anion gap: 11 (ref 5–15)
BUN: 40 mg/dL — AB (ref 8–23)
CO2: 28 mmol/L (ref 22–32)
CREATININE: 2.21 mg/dL — AB (ref 0.61–1.24)
Calcium: 9.3 mg/dL (ref 8.9–10.3)
Chloride: 100 mmol/L (ref 98–111)
GFR, EST AFRICAN AMERICAN: 28 mL/min — AB (ref >60)
GFR, EST NON AFRICAN AMERICAN: 24 mL/min — AB (ref >60)
Glucose, Bld: 126 mg/dL — ABNORMAL HIGH (ref 70–99)
Potassium: 3.6 mmol/L (ref 3.5–5.1)
SODIUM: 139 mmol/L (ref 135–145)

## 2017-08-19 LAB — URINE CULTURE: Culture: NO GROWTH

## 2017-08-19 LAB — MRSA PCR SCREENING: MRSA BY PCR: NEGATIVE

## 2017-08-19 MED ORDER — FUROSEMIDE 20 MG PO TABS: 20.0000 mg | ORAL_TABLET | ORAL | 3 refills | Status: AC

## 2017-08-19 MED ORDER — FUROSEMIDE 20 MG PO TABS: 20.0000 mg | ORAL_TABLET | ORAL | Status: DC

## 2017-08-19 NOTE — Evaluation (Signed)
Physical Therapy Evaluation Patient Details Name: [name] MRN: [id number] DOB: [birth year] Today's Date: 08/19/2017   History of Present Illness  84 y.o. male with medical history significant of hypertension, hyperlipidemia, diabetes mellitus, GERD, iron deficiency anemia, CAD, stent placement, blood dyscrasias, carotid artery stenosis, and dCHF. Presenting to ED with chest pain and near syncope.   Clinical Impression  Patient presents with decreased mobility due to decreased endurance reporting significant fatigue after ambulation this am and decreased balance and safety awareness.  Was at home with wife and 24 hour sitter.  Feel safe to return with 24 hour assist to home.  Wife also states was getting PT from facility where they live.  Agree with resuming PT services upon d/c.     Follow Up Recommendations Home health PT;Supervision/Assistance - 24 hour    Equipment Recommendations  None recommended by PT    Recommendations for Other Services       Precautions / Restrictions Precautions Precautions: Fall      Mobility  Bed Mobility Overal bed mobility: Needs Assistance Bed Mobility: Supine to Sit     Supine to sit: Supervision;HOB elevated     General bed mobility comments: up in chair  Transfers Overall transfer level: Needs assistance Equipment used: Rolling walker (2 wheeled) Transfers: Sit to/from Stand Sit to Stand: Min assist;Mod assist         General transfer comment: min A up from recliner and mod A up from toilet (without riser) with cues to use grabbar and noted posterior bias  Ambulation/Gait Ambulation/Gait assistance: Min guard Gait Distance (Feet): 130 Feet Assistive device: Rolling walker (2 wheeled) Gait Pattern/deviations: Step-to pattern;Step-through pattern;Trunk flexed;Decreased stride length;Shuffle     General Gait Details: flexed posture throughout, intermittent cues to stay close inside RW and assist at times for turns  Stairs             Wheelchair Mobility    Modified Rankin (Stroke Patients Only)       Balance Overall balance assessment: Needs assistance Sitting-balance support: No upper extremity supported;Feet supported Sitting balance-Leahy Scale: Fair Sitting balance - Comments: Able to bend forward to adjust socks without LOB   Standing balance support: During functional activity;Bilateral upper extremity supported Standing balance-Leahy Scale: Poor Standing balance comment: assist for hygiene in standing and assist to get balanced up on his feet after rising                             Pertinent Vitals/Pain Pain Assessment: No/denies pain Faces Pain Scale: No hurt Pain Intervention(s): Monitored during session    Home Living Family/patient expects to be discharged to:: Private residence Living Arrangements: Spouse/significant other Available Help at Discharge: Family;Available 24 hours/day Type of Home: House Home Access: Level entry     Home Layout: One level Home Equipment: Walker - 2 wheels;Shower seat;Hand held shower head;Grab bars - tub/shower;Bedside commode;Cane - single point Additional Comments: Per chart review and RN, [facility], but wife states in independent living and has an aide x 24 hours    Prior Function Level of Independence: Independent with assistive device(s)         Comments: Pt able to report that he uses a RW for functional mobility.  Unsure of PLOF for ADLs due to poor historian. MD noted stating pt requires aide PTA.     Hand Dominance   Dominant Hand: Right    Extremity/Trunk Assessment   Upper  Extremity Assessment Upper Extremity Assessment: Defer to OT evaluation    Lower Extremity Assessment Lower Extremity Assessment: Generalized weakness    Cervical / Trunk Assessment Cervical / Trunk Assessment: Kyphotic  Communication   Communication: HOH  Cognition Arousal/Alertness: Awake/alert Behavior During Therapy:  WFL for tasks assessed/performed Overall Cognitive Status: History of cognitive impairments - at baseline                                 General Comments: Pt attempting to get OOB upon arrival with bed alarm activiated. Pt continuing to attempt to stand before therapist ready and required cues for safety      General Comments General comments (skin integrity, edema, etc.): VSS    Exercises     Assessment/Plan    PT Assessment Patient needs continued PT services  PT Problem List Decreased strength;Decreased mobility;Decreased safety awareness;Decreased activity tolerance;Decreased balance       PT Treatment Interventions DME instruction;Functional mobility training;Balance training;Patient/family education;Gait training;Therapeutic activities;Therapeutic exercise    PT Goals (Current goals can be found in the Care Plan section)  Acute Rehab PT Goals Patient Stated Goal: to return home PT Goal Formulation: With patient/family Time For Goal Achievement: 08/26/17 Potential to Achieve Goals: Good    Frequency Min 3X/week   Barriers to discharge        Co-evaluation               AM-PAC PT "6 Clicks" Daily Activity  Outcome Measure Difficulty turning over in bed (including adjusting bedclothes, sheets and blankets)?: A Lot Difficulty moving from lying on back to sitting on the side of the bed? : Unable Difficulty sitting down on and standing up from a chair with arms (e.g., wheelchair, bedside commode, etc,.)?: Unable Help needed moving to and from a bed to chair (including a wheelchair)?: A Little Help needed walking in hospital room?: A Little Help needed climbing 3-5 steps with a railing? : A Lot 6 Click Score: 12    End of Session Equipment Utilized During Treatment: Gait belt Activity Tolerance: Patient tolerated treatment well Patient left: with call bell/phone within reach;in chair;with chair alarm set;with family/visitor present   PT Visit  Diagnosis: Other abnormalities of gait and mobility (R26.89);Muscle weakness (generalized) (M62.81)    Time: 1610-9604 PT Time Calculation (min) (ACUTE ONLY): 23 min   Charges:   PT Evaluation $PT Eval Moderate Complexity: 1 Mod PT Treatments $Gait Training: 8-22 mins   PT G CodesSheran Lawless, [place] [phone] 08/19/2017   Elray Mcgregor 08/19/2017, 12:58 PM

## 2017-08-19 NOTE — Evaluation (Signed)
Occupational Therapy Evaluation Patient Details Name: [name] MRN: [id number] DOB: 26-Dec-1922 Today's Date: 08/19/2017    History of Present Illness 84 y.o. male with medical history significant of hypertension, hyperlipidemia, diabetes mellitus, GERD, iron deficiency anemia, CAD, stent placement, blood dyscrasias, carotid artery stenosis, and dCHF. Presenting to ED with chest pain and near syncope.    Clinical Impression   Per chart review and RN, pt was living at [facility] and had an aide for ADLs and used a RW for functional mobility. Unsure of reliability of information with no family present to confirm and pt poor historian. Pt currently requiring Min Guard A for grooming at sink, [employer] A for UB ADLs, and Min A for LB ADLs and functional mobility with RW. Pt requiring cues for safety. Pt would benefit from further acute OT to facilitate safe dc. Pending confirmation of support, recommend dc home with HHOT to optimize safety and independence with ADLs and decreased fall risk.     Follow Up Recommendations  Home health OT;Supervision/Assistance - 24 hour    Equipment Recommendations  Other (comment)(Pending further information)    Recommendations for Other Services PT consult     Precautions / Restrictions Precautions Precautions: Fall Restrictions Weight Bearing Restrictions: No      Mobility Bed Mobility Overal bed mobility: Needs Assistance Bed Mobility: Supine to Sit     Supine to sit: Supervision;HOB elevated     General bed mobility comments: Pt attempting to get OOB upon arrival. Supervision for safety. No physical A need. VCs for safety  Transfers Overall transfer level: Needs assistance Equipment used: Rolling walker (2 wheeled) Transfers: Sit to/from Stand Sit to Stand: Min assist         General transfer comment: MIn A for initial power up and for balance.     Balance Overall balance assessment: Needs assistance Sitting-balance support:  No upper extremity supported;Feet supported Sitting balance-Leahy Scale: Fair Sitting balance - Comments: Able to bend forward to adjust socks without LOB   Standing balance support: No upper extremity supported;During functional activity Standing balance-Leahy Scale: Fair Standing balance comment: ABle to maintain standing at sink without support                           ADL either performed or assessed with clinical judgement   ADL Overall ADL's : Needs assistance/impaired Eating/Feeding: Set up;Sitting   Grooming: Wash/dry face;Standing;Min guard Grooming Details (indicate cue type and reason): Pt requiring Min Guard A for safety while performing grooming task at sink. Pt requiring Min VCs to recall task Upper Body Bathing: Min guard;Sitting       Upper Body Dressing : Min guard;Sitting   Lower Body Dressing: Minimal assistance;Sit to/from stand Lower Body Dressing Details (indicate cue type and reason): Min A for power up into standing and safety. Pt able to reach forward and adjust socks without difficulty.  Toilet Transfer: Minimal assistance;Ambulation;RW;Cueing for safety(Simulated to recliner)           Functional mobility during ADLs: Minimal assistance;Rolling walker General ADL Comments: Pt demonstrating decreased safety awareness and balance. Pt requiring MIn A throughout for safety in standing and balance. Feel pt is close to baseline function     Vision         Perception     Praxis      Pertinent Vitals/Pain Pain Assessment: Faces Faces Pain Scale: No hurt Pain Intervention(s): Monitored during session     Hand  Dominance Right   Extremity/Trunk Assessment Upper Extremity Assessment Upper Extremity Assessment: Generalized weakness   Lower Extremity Assessment Lower Extremity Assessment: Generalized weakness   Cervical / Trunk Assessment Cervical / Trunk Assessment: Kyphotic   Communication Communication Communication: HOH    Cognition Arousal/Alertness: Awake/alert Behavior During Therapy: WFL for tasks assessed/performed;Restless;Impulsive Overall Cognitive Status: History of cognitive impairments - at baseline                                 General Comments: Pt attempting to get OOB upon arrival with bed alarm activiated. Pt continuing to attempt to stand before therapist ready and required cues for safety   General Comments  VSS    Exercises     Shoulder Instructions      Home Living Family/patient expects to be discharged to:: Private residence                                 Additional Comments: Per chart review and RN, [facility] is at [facility].      Prior Functioning/Environment Level of Independence: Independent with assistive device(s)        Comments: Pt able to report that he uses a RW for functional mobility.  Unsure of PLOF for ADLs due to poor historian. MD noted stating pt requires aide PTA.        OT Problem List: Decreased strength;Decreased activity tolerance;Impaired balance (sitting and/or standing);Decreased cognition;Decreased safety awareness;Decreased knowledge of use of DME or AE;Decreased knowledge of precautions      OT Treatment/Interventions: Self-care/ADL training;Therapeutic exercise;Energy conservation;DME and/or AE instruction;Therapeutic activities;Patient/family education    OT Goals(Current goals can be found in the care plan section) Acute Rehab OT Goals Patient Stated Goal: Unstated OT Goal Formulation: Patient unable to participate in goal setting Time For Goal Achievement: 09/02/17 Potential to Achieve Goals: Good ADL Goals Pt Will Perform Grooming: with modified independence;standing Pt Will Perform Upper Body Dressing: with modified independence;sitting Pt Will Perform Lower Body Dressing: with modified independence;sit to/from stand Pt Will Transfer to Toilet: with modified independence;regular height  toilet;ambulating Pt Will Perform Toileting - Clothing Manipulation and hygiene: with modified independence;sit to/from stand  OT Frequency: Min 2X/week   Barriers to D/C: Other (comment)  Unsure of support at home       Co-evaluation              AM-PAC PT "6 Clicks" Daily Activity     Outcome Measure Help from another person eating meals?: None Help from another person taking care of personal grooming?: A Little Help from another person toileting, which includes using toliet, bedpan, or urinal?: A Little Help from another person bathing (including washing, rinsing, drying)?: A Little Help from another person to put on and taking off regular upper body clothing?: A Little Help from another person to put on and taking off regular lower body clothing?: A Little 6 Click Score: 19   End of Session Equipment Utilized During Treatment: Gait belt;Rolling walker Nurse Communication: Mobility status  Activity Tolerance: Patient tolerated treatment well Patient left: in chair;with call bell/phone within reach;with chair alarm set  OT Visit Diagnosis: Unsteadiness on feet (R26.81);Other abnormalities of gait and mobility (R26.89);Muscle weakness (generalized) (M62.81);Other symptoms and signs involving cognitive function;History of falling (Z91.81)                Time: [id number] OT Time  Calculation (min): 21 min Charges:  OT General Charges $OT Visit: 1 Visit OT Evaluation $OT Eval Moderate Complexity: 1 Mod G-Codes:     Betty Daidone MSOT, OTR/L Acute Rehab Pager: [phone] Office: [phone]  Theodoro Grist Donavyn Fecher 08/19/2017, 10:16 AM

## 2017-08-19 NOTE — Care Management Note (Signed)
Case Management Note  Patient Details  Name: [name] MRN: [id number] Date of Birth: 02/12/1923  Subjective/Objective:     Chest Pain              Action/Plan: Patient lives at [facility] with 24 hr care; CM talked to daughter and spouse; [facility]; they will arrange for transportation home at discharge.  Expected Discharge Date:  08/19/17               Expected Discharge Plan:     Status of Service:   Completed  Reola MosherChandler, [name], RN,MHA,BSN [phone] 08/19/2017, 12:01 PM

## 2017-08-19 NOTE — Progress Notes (Signed)
   08/19/17 1000  Clinical Encounter Type  Visited With Patient and family together  Visit Type Follow-up  Referral From Nurse  Consult/Referral To Chaplain  Spiritual Encounters  Spiritual Needs Prayer  Stress Factors  Patient Stress Factors Health changes  Family Stress Factors Health changes  Chaplain visited with the PT and wife.  Couple has been married 71 years and hope to be able to reach 84 years old.  The PT had a request for prayer for blessings and peace.  Chaplain granted both blessings and peace to the PT and wife.  They were very happy about Chaplain interaction and shared with the Chaplain stories about family.

## 2017-08-19 NOTE — Clinical Social Work Note (Signed)
CSW acknowledges SNF consult. Patient has orders to discharge [facility] today [facility].  CSW signing off.   Charlynn CourtSarah Jermall Isaacson, CSW [phone]

## 2017-08-19 NOTE — Discharge Summary (Addendum)
Physician Discharge Summary  Patient ID: Mike JumboHerley G Gillooly MRN: [id number] DOB/AGE: 07/05/1922 84 y.o.  Admit date: 08/17/2017 Discharge date: 08/19/2017  Admission Diagnoses: Principal Problem:   Chest pain Active Problems:   Essential hypertension   Chronic renal disease, stage III (HCC)   GERD (gastroesophageal reflux disease)   CAD -S/P LAD and CFX DES 10/26/14   Dyslipidemia   Anemia   Near syncope   Leukocytosis   Acute metabolic encephalopathy   Cellulitis of right leg  Discharge Diagnoses:    Chest pain   Acute diastolic CHF   Chronic renal disease, stage IV   Near syncope   Deconditioning   Acute metabolic encephalopathy   DNR   Essential hypertension   GERD (gastroesophageal reflux disease)   CAD -S/P LAD and CFX DES 10/26/14   Dyslipidemia   Anemia      Discharged Condition: fair  Presenting Summary: [name] is a 84 y.o. male with medical history significant of hypertension, hyperlipidemia, diabetes mellitus, GERD, iron deficiency anemia, CAD, stent placement, blood dyscrasias, carotid artery stenosis, dCHF, who presented with chest pain and near syncope. Pt was lethargic and has poor hearing. He was a very poor historian.  History limited.  Per report, patient had one episode of chest pain,which was located in the left side of chest, radiating to the left arm.  Patient felt lightheadedness and almost passed out, but did not.  He was helped to the bed. In ED pt was lethargic, knew his own name, but not oriented to the place and time.  Denied any chest pain, shortness breath currently.  Had bilateral leg edema.  Not sure if patient has any symptoms of a UTI. ED Course: pt was found to have WBC 17.9, negative UA, slightly worsening renal function, temperature normal, no tachycardia, oxygen saturation 95% on room air.  Chest x-ray showed mild cardiomegaly, vascular congestion.  Patient placed on telemetry bed of observation.   Hospital Course:   Chest pain and CAD:  s/p of cor stent 2016 - ruled out for MI, trops neg x 3 - no arrhythmia on telemetry - chest pain resolved - No ASA due to hx of blood dyscrasias - 2d echo showed LVEF 55% and G1DD - no further w/u, probably poor candidate for invasive procedures  Acute diast CHF/ bilat LE edema: vasc congestion by CXR and bilat LE edema, rec'd IV lasix x 24 hrs w/ good diuresis of 4L approx - will dc home on po lasix 20 mg 3 times per week (M-W-F) - no evidence cellulitis, abx dc'd  Near syncope and acute metabolic encephalopathy: Suspect deconditioning is main issue also memory loss.  Not orthostatic here, no arrhythmia.  Carotid dopplers pending at d/c but would not likely change management.  Conservative mgmnt w/ pts age and debility  DNR: discussed EOL issues and patient requested DNR status.  Wife and daughter were present for the conversation.  DNR ordered.   HTN: BP's were borderline high here, continuing home norvasc and metoprolol.   Chronic renal disease, stage IV (HCC): creat 1.8- 2.2 here, close to baseline.  GERD: -Protonix  HLD: -lipiotor  Anemia: Hgb stable, 10.5 -continue iron supplement.   Discharge Exam: Blood pressure (!) 148/76, pulse 82, temperature 97.9 F (36.6 C), temperature source Oral, resp. rate 16, height 5\' 6"  (1.676 m), weight 70.1 kg (154 lb 8.7 oz), SpO2 97 %. Alert eldelry wm no distress, slow to interact, Ox 2 No jvd Chest no rales or wheezing  Cor  RRR  Abd soft ntnd no ascites  Ext improved edema, 1+ RLE , none LLE  NF, Ox 3, gen weak , gets up w/ assist only  Disposition: Discharge disposition: 01-Home or Self Care        Allergies as of 08/19/2017   No Known Allergies     Medication List    TAKE these medications   acetaminophen 325 MG tablet Commonly known as:  TYLENOL Take 650 mg by mouth every 6 (six) hours as needed for mild pain, moderate pain, fever or headache.   amLODipine 2.5 MG tablet Commonly known as:  NORVASC Take  2.5 mg by mouth every evening.   atorvastatin 10 MG tablet Commonly known as:  LIPITOR Take 5 mg by mouth every evening.   docusate sodium 100 MG capsule Commonly known as:  COLACE Take 100 mg by mouth 2 (two) times daily as needed for mild constipation.   ferrous sulfate 325 (65 FE) MG tablet Take 325 mg by mouth every Monday, Wednesday, and Friday.   furosemide 20 MG tablet Commonly known as:  LASIX Take 1 tablet (20 mg total) by mouth every Monday, Wednesday, and Friday. Start taking on:  08/20/2017   lidocaine 5 % Commonly known as:  LIDODERM Place 1 patch onto the skin daily. Remove & Discard patch within 12 hours or as directed by MD   metFORMIN 500 MG 24 hr tablet Commonly known as:  GLUCOPHAGE-XR Take 1 tablet (500 mg total) by mouth every morning. What changed:  when to take this   metoprolol tartrate 25 MG tablet Commonly known as:  LOPRESSOR Take 25 mg by mouth 2 (two) times daily.   multivitamin-lutein Caps capsule Take 1 capsule by mouth daily.   nitroGLYCERIN 0.4 MG SL tablet Commonly known as:  NITROSTAT Place 0.4 mg under the tongue every 5 (five) minutes as needed for chest pain.   omeprazole 20 MG capsule Commonly known as:  PRILOSEC Take 20 mg by mouth daily.   oxycodone 5 MG capsule Commonly known as:  OXY-IR Take 1 capsule (5 mg total) by mouth every 4 (four) hours as needed (Severe Pain).   polyethylene glycol packet Commonly known as:  MIRALAX Take 17 g by mouth daily as needed for moderate constipation or severe constipation.   senna 8.6 MG Tabs tablet Commonly known as:  SENOKOT Take 1 tablet (8.6 mg total) by mouth at bedtime as needed for mild constipation.        Signed: Barbette Hair Kordelia Severin 08/19/2017, 11:31 AM

## 2017-08-19 NOTE — Progress Notes (Signed)
Pt discharge education provided at bedside with pt, pt wife and family. Pt has all belongings. Nursing student removed IV and telemetry monitor. Pt discharged via wheelchair with nursing student

## 2017-08-23 LAB — CULTURE, BLOOD (ROUTINE X 2)
Culture: NO GROWTH
Culture: NO GROWTH
Special Requests: ADEQUATE
Special Requests: ADEQUATE

## 2017-09-27 ENCOUNTER — Emergency Department (HOSPITAL_COMMUNITY)
Admission: EM | Admit: 2017-09-27 | Discharge: 2017-09-28 | Disposition: A | Discharge status: Skilled Nursing Facility | Payer: Medicare Other | Attending: Emergency Medicine | Admitting: Emergency Medicine

## 2017-09-27 ENCOUNTER — Encounter (HOSPITAL_COMMUNITY): Payer: Self-pay | Admitting: Emergency Medicine

## 2017-09-27 DIAGNOSIS — Z96641 Presence of right artificial hip joint: Secondary | ICD-10-CM | POA: Diagnosis not present

## 2017-09-27 DIAGNOSIS — Z79899 Other long term (current) drug therapy: Secondary | ICD-10-CM | POA: Insufficient documentation

## 2017-09-27 DIAGNOSIS — I129 Hypertensive chronic kidney disease with stage 1 through stage 4 chronic kidney disease, or unspecified chronic kidney disease: Secondary | ICD-10-CM | POA: Insufficient documentation

## 2017-09-27 DIAGNOSIS — W010XXA Fall on same level from slipping, tripping and stumbling without subsequent striking against object, initial encounter: Secondary | ICD-10-CM | POA: Insufficient documentation

## 2017-09-27 DIAGNOSIS — E1122 Type 2 diabetes mellitus with diabetic chronic kidney disease: Secondary | ICD-10-CM | POA: Diagnosis not present

## 2017-09-27 DIAGNOSIS — S61401A Unspecified open wound of right hand, initial encounter: Secondary | ICD-10-CM | POA: Insufficient documentation

## 2017-09-27 DIAGNOSIS — Z955 Presence of coronary angioplasty implant and graft: Secondary | ICD-10-CM | POA: Insufficient documentation

## 2017-09-27 DIAGNOSIS — Z87891 Personal history of nicotine dependence: Secondary | ICD-10-CM | POA: Insufficient documentation

## 2017-09-27 DIAGNOSIS — N183 Chronic kidney disease, stage 3 (moderate): Secondary | ICD-10-CM | POA: Diagnosis not present

## 2017-09-27 DIAGNOSIS — S2231XA Fracture of one rib, right side, initial encounter for closed fracture: Secondary | ICD-10-CM | POA: Diagnosis not present

## 2017-09-27 DIAGNOSIS — Z7984 Long term (current) use of oral hypoglycemic drugs: Secondary | ICD-10-CM | POA: Insufficient documentation

## 2017-09-27 DIAGNOSIS — M25551 Pain in right hip: Secondary | ICD-10-CM | POA: Diagnosis not present

## 2017-09-27 DIAGNOSIS — Y939 Activity, unspecified: Secondary | ICD-10-CM | POA: Diagnosis not present

## 2017-09-27 DIAGNOSIS — Y999 Unspecified external cause status: Secondary | ICD-10-CM | POA: Insufficient documentation

## 2017-09-27 DIAGNOSIS — S299XXA Unspecified injury of thorax, initial encounter: Secondary | ICD-10-CM | POA: Diagnosis present

## 2017-09-27 DIAGNOSIS — Y92129 Unspecified place in nursing home as the place of occurrence of the external cause: Secondary | ICD-10-CM | POA: Diagnosis not present

## 2017-09-27 DIAGNOSIS — I2510 Atherosclerotic heart disease of native coronary artery without angina pectoris: Secondary | ICD-10-CM | POA: Diagnosis not present

## 2017-09-27 DIAGNOSIS — W19XXXA Unspecified fall, initial encounter: Secondary | ICD-10-CM

## 2017-09-27 NOTE — ED Triage Notes (Addendum)
Per PTAR, pt. From [facility]   with complaint of fall , witnessed by his caregiver at around 1115 this evening. Pt. Was in  the bathroom   and fell unto his  Caregiver. Denied hitting head ,denied LOC but reported of right hip pain after the fall and skin tear on right forearm. No bleeding reported. A/O x 2

## 2017-09-27 NOTE — ED Notes (Signed)
Bed: [id number] Expected date:  Expected time:  Means of arrival:  Comments: 84 yo M/Fall-Right side pain

## 2017-09-28 ENCOUNTER — Emergency Department (HOSPITAL_COMMUNITY): Payer: Medicare Other

## 2017-09-28 NOTE — ED Provider Notes (Signed)
[facility]-EMERGENCY DEPT Provider Note   CSN: [id number] Arrival date & time: 09/27/17  2327     History   Chief Complaint Chief Complaint  Patient presents with  . Fall  . right hip pain    HPI [name] is a 84 y.o. male.  HPI Patient is a 84 year old male presents to the emergency department with complaints of fall today.  He was in the restroom with his full-time caretaker when he tripped on his slipper and fell towards his right side.  He presented with a skin tear to the dorsum of his right hand as well as pain in his right lateral chest and mild pain in his right hip.  He has had a hip replacement on his right side before.  No shortening or external rotation noted of the legs.  Denies pain in his shoulders, elbows, wrist.  Denies pain in his knees and ankles bilaterally.  No head injury.  No neck pain.  No weakness of his arms or legs.  Denies abdominal pain.  No shortness of breath.  Symptoms are mild in severity.  He does not want any medication for pain at this time   Past Medical History:  Diagnosis Date  . Anemia   . Blood dyscrasia    ' free bleeder' per patient   . Coronary artery disease    3 coronary stents- followed by Dr Gwenlyn Saranroituri   . Diabetes mellitus without complication (HCC)    type II   . Essential hypertension 10/22/2014  . GERD (gastroesophageal reflux disease)   . Multiple abrasions    on arms due to stumbling over chair on 11/19/16 per patient     Patient Active Problem List   Diagnosis Date Noted  . Chest pain 08/18/2017  . Near syncope 08/18/2017  . Leukocytosis 08/18/2017  . Acute metabolic encephalopathy 08/18/2017  . Cellulitis of right leg 08/18/2017  . Blood dyscrasia   . Anemia 07/03/2017  . Fall 07/03/2017  . Falls 07/03/2017  . 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 Dyslipidemia 10/05/2015  . CAD -S/P LAD and CFX DES 10/26/14 10/25/2014  . Abnormal nuclear cardiac imaging test 10/23/2014  .  Carotid bruit-Rt-dopplers OK 10/23/2014  . GERD (gastroesophageal reflux disease) 10/23/2014  . Diabetes (HCC) 10/23/2014  . Essential hypertension 10/22/2014  . Chronic renal disease, stage III (HCC) 10/22/2014  . 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 Anginal pain (HCC) 10/21/2014  . [facility]    Past Surgical History:  Procedure Laterality Date  . CARDIAC CATHETERIZATION N/A 10/25/2014   Procedure: Left Heart Cath and Coronary Angiography;  Surgeon: Kathleene Hazelhristopher D McAlhany, MD;  [facility];  Service: Cardiovascular;  Laterality: N/A;  . CARDIAC CATHETERIZATION N/A 10/26/2014   Procedure: Coronary Stent Intervention;  Surgeon: Kathleene Hazelhristopher D McAlhany, MD;  Location: [facility] INVASIVE CV LAB;  Service: Cardiovascular;  Laterality: N/A;  . ESOPHAGOGASTRODUODENOSCOPY (EGD) WITH PROPOFOL N/A 11/25/2016   Procedure: ESOPHAGOGASTRODUODENOSCOPY (EGD) WITH PROPOFOL;  Surgeon: Carman ChingEdwards, James, MD;  Location: [facility] ENDOSCOPY;  Service: Endoscopy;  Laterality: N/A;  . HELLER MYOTOMY  12/17/2011   Procedure: LAPAROSCOPIC HELLER MYOTOMY;  Surgeon: Valarie MerinoMatthew B Martin, MD;  Location: [facility] ORS;  Service: General;  Laterality: N/A;  . HERNIA REPAIR  1952  . JOINT REPLACEMENT  2007   rt hip   . SAVORY DILATION N/A 11/25/2016   Procedure: SAVORY DILATION;  Surgeon: Carman ChingEdwards, James, MD;  Location: [facility] ENDOSCOPY;  Service: Endoscopy;  Laterality: N/A;  . UPPER GI ENDOSCOPY  12/17/2011   Procedure: UPPER GI ENDOSCOPY;  Surgeon: Valarie Merino, MD;  Location: [facility] ORS;  Service: General;  Laterality: N/A;        Home Medications    Prior to Admission medications   Medication Sig Start Date End Date Taking? Authorizing Provider  acetaminophen (TYLENOL) 325 MG tablet Take 650 mg by mouth every 6 (six) hours as needed for mild pain, moderate pain, fever or headache.     [provider]  amLODipine (NORVASC) 2.5 MG tablet Take 2.5 mg by mouth every  evening.    [provider]  atorvastatin (LIPITOR) 10 MG tablet Take 5 mg by mouth every evening.  02/21/17   [provider]  docusate sodium (COLACE) 100 MG capsule Take 100 mg by mouth 2 (two) times daily as needed for mild constipation.    [provider]  ferrous sulfate 325 (65 FE) MG tablet Take 325 mg by mouth every Monday, Wednesday, and Friday.     [provider]  furosemide (LASIX) 20 MG tablet Take 1 tablet (20 mg total) by mouth every Monday, Wednesday, and Friday. 08/20/17   Delano Metz, MD  lidocaine (LIDODERM) 5 % Place 1 patch onto the skin daily. Remove & Discard patch within 12 hours or as directed by MD Patient not taking: Reported on 08/18/2017 07/05/17   Dimple Nanas, MD  metFORMIN (GLUCOPHAGE-XR) 500 MG 24 hr tablet Take 1 tablet (500 mg total) by mouth every morning. Patient taking differently: Take 500 mg by mouth every evening.  10/29/14   Abelino Derrick, PA-C  metoprolol tartrate (LOPRESSOR) 25 MG tablet Take 25 mg by mouth 2 (two) times daily. 08/11/17   [provider]  multivitamin-lutein (OCUVITE-LUTEIN) CAPS capsule Take 1 capsule by mouth daily.    [provider]  nitroGLYCERIN (NITROSTAT) 0.4 MG SL tablet Place 0.4 mg under the tongue every 5 (five) minutes as needed for chest pain.    [provider]  omeprazole (PRILOSEC) 20 MG capsule Take 20 mg by mouth daily. 08/05/17   [provider]  oxycodone (OXY-IR) 5 MG capsule Take 1 capsule (5 mg total) by mouth every 4 (four) hours as needed (Severe Pain). Patient not taking: Reported on 08/18/2017 07/05/17   Dimple Nanas, MD  polyethylene glycol [facility]) packet Take 17 g by mouth daily as needed for moderate constipation or severe constipation. 07/05/17   Amin, Loura Halt, MD  senna (SENOKOT) 8.6 MG TABS tablet Take 1 tablet (8.6 mg total) by mouth at bedtime as needed for mild constipation. 07/05/17   Dimple Nanas, MD    Family  History Family History  Problem Relation Age of Onset  . Heart disease Mother   . Heart disease Father     Social History Social History   Tobacco Use  . Smoking status: Former Games developer  . Smokeless tobacco: Never Used  . Tobacco comment: stopped 27yrs ago  Substance Use Topics  . Alcohol use: No  . Drug use: No     Allergies   Patient has no known allergies.   Review of Systems Review of Systems  All other systems reviewed and are negative.    Physical Exam Updated Vital Signs BP (!) 158/76   Pulse 79   Temp 97.8 F (36.6 C) (Oral)   Resp 18   SpO2 98%   Physical Exam  Constitutional: He is oriented to person, place, and time. He appears  well-developed and well-nourished.  HENT:  Head: Normocephalic and atraumatic.  Eyes: EOM are normal.  Neck: Normal range of motion. Neck supple.  No C-spine tenderness  Cardiovascular: Normal rate, regular rhythm and normal heart sounds.  Pulmonary/Chest: Effort normal and breath sounds normal. No respiratory distress.  Abdominal: Soft. He exhibits no distension. There is no tenderness.  Musculoskeletal: Normal range of motion.  Full range of motion of bilateral shoulders, elbows, wrist.  Full range of motion bilateral hips, knees, ankles.  No external rotation or shortening of the right lower extremity.  Mild tenderness right lateral chest.  Small skin tear dorsum of right hand  Neurological: He is alert and oriented to person, place, and time.  Skin: Skin is warm and dry.  Psychiatric: He has a normal mood and affect. Judgment normal.  Nursing note and vitals reviewed.    ED Treatments / Results  Labs (all labs ordered are listed, but only abnormal results are displayed) Labs Reviewed - No data to display  EKG None  Radiology Dg Ribs Unilateral W/chest Right  Result Date: 09/28/2017 CLINICAL DATA:  Right lower anterior rib pain after fall. EXAM: RIGHT RIBS AND CHEST - 3+ VIEW COMPARISON:  None. FINDINGS: A  fracture of the anterolateral right eighth rib is noted with slight developing callus suggesting a subacute fracture. Heart is top-normal in size. There is aortic atherosclerosis with ectasia. Streaky parenchymal opacities likely representing atelectasis are noted about the hila without alveolar confluence, effusion or pneumothorax. IMPRESSION: Angulated fracture involving the anterolateral right eighth rib, on some images demonstrating subtle callus formation suggesting a subacute fracture. Electronically Signed   By: Tollie Eth M.D.   On: 09/28/2017 01:15   Dg Hip Unilat W Or Wo Pelvis 2-3 Views Right  Result Date: 09/28/2017 CLINICAL DATA:  Right hip pain after fall. EXAM: DG HIP (WITH OR WITHOUT PELVIS) 2-3V RIGHT COMPARISON:  CT abdomen and pelvis 09/06/2016 FINDINGS: Lower lumbar degenerative disc disease from L4 through S1 with facet arthropathy. Osteoarthritis of the SI joints with sclerosis and joint space narrowing bilaterally. No pelvic diastasis or pelvic fracture. Intact press-fit right hip arthroplasty with cemented femoral component. Heterotopic new bone formation is seen adjacent to the right hip superolaterally. The included native left hip demonstrates joint space narrowing. Heterotopic ossifications project over the superolateral aspect of the femoral head-neck junction and is noted to be off the greater trochanter on prior CT exam. Femoroacetabular impingement morphology of the left hip is identified with bony prominence along the superolateral aspect of the femoral head-neck junction. IMPRESSION: 1. Lower lumbar degenerative disc and facet arthropathy. 2. Bilateral SI joint osteoarthritis. 3. No acute pelvic fracture. 4. Intact right hip arthroplasty without hardware failure. 5. Heterotopic ossifications are identified adjacent to the greater trochanter of the left hip with osteoarthritic joint space narrowing of the left hip noted. 6. Femoroacetabular impingement morphology of the CAM  variety is identified of the native left hip with mild smooth bony protuberance along the superolateral aspect of the left femoral head-neck junction. Electronically Signed   By: Tollie Eth M.D.   On: 09/28/2017 01:21      Procedures .Ortho Injury Treatment Performed by: Azalia Bilis, MD Authorized by: Azalia Bilis, MD     Definitive Fracture Care  Definitive fracture care was performed for the patient's right 8th rib fracture Treatment includes management of pain Fracture related discharge instructions were provided Symptomatic control measures provided to the patient      Medications Ordered in ED Medications -  No data to display   Initial Impression / Assessment and Plan / ED Course  I have reviewed the triage vital signs and the nursing notes.  Pertinent labs & imaging results that were available during my care of the patient were reviewed by me and considered in my medical decision making (see chart for details).    Likely right rib fracture given tenderness and fall towards his right side and radiographic findings.  No abnormalities noted in his pelvis or right hip.  Patient is ambulatory in the emergency department and can bear full weight on his right hip without difficulty.  Primary care follow-up.  Patient understands return to the ER for new or worsening symptoms  Final Clinical Impressions(s) / ED Diagnoses   Final diagnoses:  Fall, initial encounter  Closed fracture of one rib of right side, initial encounter    ED Discharge Orders    None       Azalia Bilis, MD 09/28/17 [phone]

## 2017-09-28 NOTE — ED Notes (Signed)
Pt able to bear weight. He had steady gait using a walker and made about 10 steps out into the hallway.

## 2017-10-14 ENCOUNTER — Other Ambulatory Visit: Payer: Self-pay | Admitting: Geriatric Medicine

## 2017-10-14 ENCOUNTER — Ambulatory Visit
Admission: RE | Admit: 2017-10-14 | Discharge: 2017-10-14 | Disposition: A | Discharge status: Home / Self Care | Payer: Medicare Other | Source: Ambulatory Visit | Attending: Geriatric Medicine | Admitting: Geriatric Medicine

## 2017-10-14 DIAGNOSIS — M25571 Pain in right ankle and joints of right foot: Secondary | ICD-10-CM

## 2018-03-07 ENCOUNTER — Encounter (HOSPITAL_COMMUNITY): Payer: Self-pay | Admitting: Emergency Medicine

## 2018-03-07 ENCOUNTER — Emergency Department (HOSPITAL_COMMUNITY): Payer: Medicare Other

## 2018-03-07 ENCOUNTER — Emergency Department (HOSPITAL_COMMUNITY)
Admission: EM | Admit: 2018-03-07 | Discharge: 2018-03-07 | Disposition: A | Discharge status: Home / Self Care | Payer: Medicare Other | Attending: Emergency Medicine | Admitting: Emergency Medicine

## 2018-03-07 DIAGNOSIS — R1110 Vomiting, unspecified: Secondary | ICD-10-CM | POA: Diagnosis present

## 2018-03-07 DIAGNOSIS — Z7984 Long term (current) use of oral hypoglycemic drugs: Secondary | ICD-10-CM | POA: Diagnosis not present

## 2018-03-07 DIAGNOSIS — R531 Weakness: Secondary | ICD-10-CM | POA: Insufficient documentation

## 2018-03-07 DIAGNOSIS — I2510 Atherosclerotic heart disease of native coronary artery without angina pectoris: Secondary | ICD-10-CM | POA: Insufficient documentation

## 2018-03-07 DIAGNOSIS — E119 Type 2 diabetes mellitus without complications: Secondary | ICD-10-CM | POA: Insufficient documentation

## 2018-03-07 DIAGNOSIS — Z79899 Other long term (current) drug therapy: Secondary | ICD-10-CM | POA: Insufficient documentation

## 2018-03-07 DIAGNOSIS — I10 Essential (primary) hypertension: Secondary | ICD-10-CM | POA: Insufficient documentation

## 2018-03-07 LAB — CBC WITH DIFFERENTIAL/PLATELET
ABS IMMATURE GRANULOCYTES: 0.03 10*3/uL (ref 0.00–0.07)
Basophils Absolute: 0 10*3/uL (ref 0.0–0.1)
Basophils Relative: 0 %
Eosinophils Absolute: 0.3 10*3/uL (ref 0.0–0.5)
Eosinophils Relative: 3 %
HEMATOCRIT: 32.9 % — AB (ref 39.0–52.0)
HEMOGLOBIN: 10.2 g/dL — AB (ref 13.0–17.0)
IMMATURE GRANULOCYTES: 0 %
LYMPHS PCT: 16 %
Lymphs Abs: 1.7 10*3/uL (ref 0.7–4.0)
MCH: 29.3 pg (ref 26.0–34.0)
MCHC: 31 g/dL (ref 30.0–36.0)
MCV: 94.5 fL (ref 80.0–100.0)
MONO ABS: 0.7 10*3/uL (ref 0.1–1.0)
MONOS PCT: 7 %
NEUTROS ABS: 7.7 10*3/uL (ref 1.7–7.7)
NEUTROS PCT: 74 %
PLATELETS: 226 10*3/uL (ref 150–400)
RBC: 3.48 MIL/uL — ABNORMAL LOW (ref 4.22–5.81)
RDW: 13.9 % (ref 11.5–15.5)
WBC: 10.5 10*3/uL (ref 4.0–10.5)
nRBC: 0 % (ref 0.0–0.2)

## 2018-03-07 LAB — COMPREHENSIVE METABOLIC PANEL
ALBUMIN: 2.8 g/dL — AB (ref 3.5–5.0)
ALT: 12 U/L (ref 0–44)
AST: 15 U/L (ref 15–41)
Alkaline Phosphatase: 51 U/L (ref 38–126)
Anion gap: 8 (ref 5–15)
BUN: 31 mg/dL — AB (ref 8–23)
CHLORIDE: 111 mmol/L (ref 98–111)
CO2: 22 mmol/L (ref 22–32)
CREATININE: 2.2 mg/dL — AB (ref 0.61–1.24)
Calcium: 9.3 mg/dL (ref 8.9–10.3)
GFR calc Af Amer: 29 mL/min — ABNORMAL LOW (ref >60)
GFR calc non Af Amer: 25 mL/min — ABNORMAL LOW (ref >60)
GLUCOSE: 138 mg/dL — AB (ref 70–99)
POTASSIUM: 4.3 mmol/L (ref 3.5–5.1)
Sodium: 141 mmol/L (ref 135–145)
Total Bilirubin: 0.7 mg/dL (ref 0.3–1.2)
Total Protein: 5.3 g/dL — ABNORMAL LOW (ref 6.5–8.1)

## 2018-03-07 LAB — URINALYSIS, ROUTINE W REFLEX MICROSCOPIC
BILIRUBIN URINE: NEGATIVE
Glucose, UA: 50 mg/dL — AB
HGB URINE DIPSTICK: NEGATIVE
Ketones, ur: NEGATIVE mg/dL
Leukocytes, UA: NEGATIVE
Nitrite: NEGATIVE
PH: 5 (ref 5.0–8.0)
Protein, ur: NEGATIVE mg/dL
SPECIFIC GRAVITY, URINE: 1.016 (ref 1.005–1.030)

## 2018-03-07 LAB — TYPE AND SCREEN
ABO/RH(D): B POS
Antibody Screen: NEGATIVE

## 2018-03-07 LAB — ABO/RH: ABO/RH(D): B POS

## 2018-03-07 LAB — LIPASE, BLOOD: Lipase: 72 U/L — ABNORMAL HIGH (ref 11–51)

## 2018-03-07 MED ORDER — SODIUM CHLORIDE 0.9 % IV SOLN
INTRAVENOUS | Status: DC
  Administered 2018-03-07: 10:00:00 via INTRAVENOUS

## 2018-03-07 NOTE — ED Notes (Signed)
Patient transported to X-ray 

## 2018-03-07 NOTE — ED Notes (Signed)
While doing standing orthostatic pt had to be held by tech and nurse to stand.

## 2018-03-07 NOTE — Discharge Instructions (Addendum)
Follow-up with Dr. Pete Glatter next week , return here for any problems

## 2018-03-07 NOTE — ED Provider Notes (Signed)
[facility] EMERGENCY DEPARTMENT Provider Note   CSN: [id number] Arrival date & time: 03/07/18  4098     History   Chief Complaint Chief Complaint  Patient presents with  . Emesis    HPI [name] is a 85 y.o. male.  85 year old male who presents via EMS after having emesis x1 just prior to arrival.  Emesis was noted to be dark with flecks of blood.  Patient did have coffee just prior to this.  According to EMS who spoke with the patient's wife, patient has emesis daily and this is no different.  Wife felt that patient was at his baseline.  No reported dark stools.  He has had some weakness and EMS found the patient to be slightly orthostatic and gave the patient 50 cc of saline.  Last blood pressure has systolic over 120.  CBG was above 100 per EMS.  Patient has no complaints currently.     Past Medical History:  Diagnosis Date  . Anemia   . Blood dyscrasia    ' free bleeder' per patient   . Coronary artery disease    3 coronary stents- followed by Dr Gwenlyn Saran   . Diabetes mellitus without complication (HCC)    type II   . Essential hypertension 10/22/2014  . GERD (gastroesophageal reflux disease)   . Multiple abrasions    on arms due to stumbling over chair on 11/19/16 per patient     Patient Active Problem List   Diagnosis Date Noted  . Chest pain 08/18/2017  . Near syncope 08/18/2017  . Leukocytosis 08/18/2017  . Acute metabolic encephalopathy 08/18/2017  . Cellulitis of right leg 08/18/2017  . Blood dyscrasia   . Anemia 07/03/2017  . Fall 07/03/2017  . Falls 07/03/2017  . 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 Dyslipidemia 10/05/2015  . CAD -S/P LAD and CFX DES 10/26/14 10/25/2014  . Abnormal nuclear cardiac imaging test 10/23/2014  . Carotid bruit-Rt-dopplers OK 10/23/2014  . GERD (gastroesophageal reflux disease) 10/23/2014  . Diabetes (HCC) 10/23/2014  . Essential hypertension 10/22/2014  . Chronic renal disease,  stage III (HCC) 10/22/2014  . 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 Anginal pain (HCC) 10/21/2014  . Lap [facility] Myotomy Oct 2013 05/07/2012    Past Surgical History:  Procedure Laterality Date  . CARDIAC CATHETERIZATION N/A 10/25/2014   Procedure: Left Heart Cath and Coronary Angiography;  Surgeon: Kathleene Hazel, MD;  Location: [facility] INVASIVE CV LAB;  Service: Cardiovascular;  Laterality: N/A;  . CARDIAC CATHETERIZATION N/A 10/26/2014   Procedure: Coronary Stent Intervention;  Surgeon: Kathleene Hazel, MD;  Location: [facility] INVASIVE CV LAB;  Service: Cardiovascular;  Laterality: N/A;  . ESOPHAGOGASTRODUODENOSCOPY (EGD) WITH PROPOFOL N/A 11/25/2016   Procedure: ESOPHAGOGASTRODUODENOSCOPY (EGD) WITH PROPOFOL;  Surgeon: Carman Ching, MD;  Location: [facility] ENDOSCOPY;  Service: Endoscopy;  Laterality: N/A;  . HELLER MYOTOMY  12/17/2011   Procedure: LAPAROSCOPIC HELLER MYOTOMY;  Surgeon: Valarie Merino, MD;  Location: [facility] ORS;  Service: General;  Laterality: N/A;  . HERNIA REPAIR  1952  . JOINT REPLACEMENT  2007   rt hip   . SAVORY DILATION N/A 11/25/2016   Procedure: SAVORY DILATION;  Surgeon: Carman Ching, MD;  Location: [facility] ENDOSCOPY;  Service: Endoscopy;  Laterality: N/A;  . UPPER GI ENDOSCOPY  12/17/2011   Procedure: UPPER GI ENDOSCOPY;  Surgeon: Valarie Merino, MD;  Location: [facility] ORS;  Service: General;  Laterality: N/A;        Home Medications  Prior to Admission medications   Medication Sig Start Date End Date Taking? Authorizing Provider  acetaminophen (TYLENOL) 325 MG tablet Take 650 mg by mouth every 6 (six) hours as needed for mild pain, moderate pain, fever or headache.     [provider]  amLODipine (NORVASC) 2.5 MG tablet Take 2.5 mg by mouth every evening.    [provider]  atorvastatin (LIPITOR) 10 MG tablet Take 5 mg by mouth every evening.  02/21/17   [provider]  docusate sodium (COLACE) 100 MG capsule Take 100  mg by mouth 2 (two) times daily as needed for mild constipation.    [provider]  ferrous sulfate 325 (65 FE) MG tablet Take 325 mg by mouth every Monday, Wednesday, and Friday.     [provider]  furosemide (LASIX) 20 MG tablet Take 1 tablet (20 mg total) by mouth every Monday, Wednesday, and Friday. 08/20/17   Delano MetzSchertz, Robert, MD  lidocaine (LIDODERM) 5 % Place 1 patch onto the skin daily. Remove & Discard patch within 12 hours or as directed by MD Patient not taking: Reported on 08/18/2017 07/05/17   Dimple NanasAmin, Ankit Chirag, MD  metFORMIN (GLUCOPHAGE-XR) 500 MG 24 hr tablet Take 1 tablet (500 mg total) by mouth every morning. Patient taking differently: Take 500 mg by mouth every evening.  10/29/14   Abelino DerrickKilroy, Luke K, PA-C  metoprolol tartrate (LOPRESSOR) 25 MG tablet Take 25 mg by mouth 2 (two) times daily. 08/11/17   [provider]  multivitamin-lutein (OCUVITE-LUTEIN) CAPS capsule Take 1 capsule by mouth daily.    [provider]  nitroGLYCERIN (NITROSTAT) 0.4 MG SL tablet Place 0.4 mg under the tongue every 5 (five) minutes as needed for chest pain.    [provider]  omeprazole (PRILOSEC) 20 MG capsule Take 20 mg by mouth daily. 08/05/17   [provider]  oxycodone (OXY-IR) 5 MG capsule Take 1 capsule (5 mg total) by mouth every 4 (four) hours as needed (Severe Pain). Patient not taking: Reported on 08/18/2017 07/05/17   Dimple NanasAmin, Ankit Chirag, MD  polyethylene glycol [facility](MIRALAX) packet Take 17 g by mouth daily as needed for moderate constipation or severe constipation. 07/05/17   Amin, Loura HaltAnkit Chirag, MD  senna (SENOKOT) 8.6 MG TABS tablet Take 1 tablet (8.6 mg total) by mouth at bedtime as needed for mild constipation. 07/05/17   Dimple NanasAmin, Ankit Chirag, MD    Family History Family History  Problem Relation Age of Onset  . Heart disease Mother   . Heart disease Father     Social History Social History   Tobacco Use  . Smoking status: Former Games developermoker    . Smokeless tobacco: Never Used  . Tobacco comment: stopped 9284yrs ago  Substance Use Topics  . Alcohol use: No  . Drug use: No     Allergies   Patient has no known allergies.   Review of Systems Review of Systems  All other systems reviewed and are negative.    Physical Exam Updated Vital Signs Temp 98.4 F (36.9 C)   Physical Exam Vitals signs and nursing note reviewed.  Constitutional:      General: He is not in acute distress.    Appearance: Normal appearance. He is well-developed. He is not toxic-appearing.  HENT:     Head: Normocephalic and atraumatic.  Eyes:     General: Lids are normal.     Conjunctiva/sclera: Conjunctivae normal.     Pupils: Pupils are equal, round, and reactive to  light.  Neck:     Musculoskeletal: Normal range of motion and neck supple.     Thyroid: No thyroid mass.     Trachea: No tracheal deviation.  Cardiovascular:     Rate and Rhythm: Normal rate and regular rhythm.     Heart sounds: Normal heart sounds. No murmur. No gallop.   Pulmonary:     Effort: Pulmonary effort is normal. No respiratory distress.     Breath sounds: Normal breath sounds. No stridor. No decreased breath sounds, wheezing, rhonchi or rales.  Abdominal:     General: Bowel sounds are normal. There is no distension.     Palpations: Abdomen is soft.     Tenderness: There is no abdominal tenderness. There is no rebound.  Musculoskeletal: Normal range of motion.        General: No tenderness.  Skin:    General: Skin is warm and dry.     Findings: No abrasion or rash.  Neurological:     Mental Status: He is alert and oriented to person, place, and time. Mental status is at baseline.     GCS: GCS eye subscore is 4. GCS verbal subscore is 5. GCS motor subscore is 6.     Cranial Nerves: No cranial nerve deficit.     Sensory: No sensory deficit.     Motor: No tremor.     Comments: Strength is 4/5 in all extremities  Psychiatric:        Attention and Perception: He  is inattentive.        Speech: Speech is delayed.        Behavior: Behavior is withdrawn.      ED Treatments / Results  Labs (all labs ordered are listed, but only abnormal results are displayed) Labs Reviewed  CBC WITH DIFFERENTIAL/PLATELET  COMPREHENSIVE METABOLIC PANEL  LIPASE, BLOOD  TYPE AND SCREEN    EKG None  Radiology No results found.  Procedures Procedures (including critical care time)  Medications Ordered in ED Medications  0.9 %  sodium chloride infusion (has no administration in time range)     Initial Impression / Assessment and Plan / ED Course  I have reviewed the triage vital signs and the nursing notes.  Pertinent labs & imaging results that were available during my care of the patient were reviewed by me and considered in my medical decision making (see chart for details).     Patient  labs do show mildly elevated lipase.  Because patient had a history of emesis patient abdominal CT which did not show any acute findings.  Urinalysis negative.  Patient resting comfortably at this time.  He is at his baseline according to the family who was in the room with him.  Reports that he has had gradual whole body weakness over the past week or so and that he uses a walker with assistance.  Patient to follow-up with his doctor next week  Final Clinical Impressions(s) / ED Diagnoses   Final diagnoses:  None    ED Discharge Orders    None       Lorre Nick, MD 03/07/18 1426

## 2018-03-07 NOTE — ED Triage Notes (Signed)
Pt arrives via EMS from [facility] ind living with reports of dark brown emesis with some red spots. Pt reports weakness and some abd pain. Wife reports pt is at baseline.

## 2018-03-07 NOTE — ED Notes (Signed)
Patient transported to CT 

## 2018-07-19 ENCOUNTER — Emergency Department (HOSPITAL_COMMUNITY): Payer: Medicare Other

## 2018-07-19 ENCOUNTER — Encounter (HOSPITAL_COMMUNITY): Payer: Self-pay | Admitting: Emergency Medicine

## 2018-07-19 ENCOUNTER — Other Ambulatory Visit: Payer: Self-pay

## 2018-07-19 ENCOUNTER — Emergency Department (HOSPITAL_COMMUNITY)
Admission: EM | Admit: 2018-07-19 | Discharge: 2018-07-20 | Disposition: A | Discharge status: Home / Self Care | Payer: Medicare Other | Attending: Emergency Medicine | Admitting: Emergency Medicine

## 2018-07-19 DIAGNOSIS — N289 Disorder of kidney and ureter, unspecified: Secondary | ICD-10-CM | POA: Diagnosis not present

## 2018-07-19 DIAGNOSIS — Z79899 Other long term (current) drug therapy: Secondary | ICD-10-CM | POA: Insufficient documentation

## 2018-07-19 DIAGNOSIS — N183 Chronic kidney disease, stage 3 (moderate): Secondary | ICD-10-CM | POA: Insufficient documentation

## 2018-07-19 DIAGNOSIS — R4182 Altered mental status, unspecified: Secondary | ICD-10-CM | POA: Diagnosis present

## 2018-07-19 DIAGNOSIS — Z96641 Presence of right artificial hip joint: Secondary | ICD-10-CM | POA: Insufficient documentation

## 2018-07-19 DIAGNOSIS — Z20828 Contact with and (suspected) exposure to other viral communicable diseases: Secondary | ICD-10-CM | POA: Insufficient documentation

## 2018-07-19 DIAGNOSIS — E119 Type 2 diabetes mellitus without complications: Secondary | ICD-10-CM | POA: Diagnosis not present

## 2018-07-19 DIAGNOSIS — E1122 Type 2 diabetes mellitus with diabetic chronic kidney disease: Secondary | ICD-10-CM | POA: Insufficient documentation

## 2018-07-19 DIAGNOSIS — J181 Lobar pneumonia, unspecified organism: Secondary | ICD-10-CM | POA: Insufficient documentation

## 2018-07-19 DIAGNOSIS — I10 Essential (primary) hypertension: Secondary | ICD-10-CM | POA: Diagnosis not present

## 2018-07-19 DIAGNOSIS — I129 Hypertensive chronic kidney disease with stage 1 through stage 4 chronic kidney disease, or unspecified chronic kidney disease: Secondary | ICD-10-CM | POA: Insufficient documentation

## 2018-07-19 DIAGNOSIS — Z7984 Long term (current) use of oral hypoglycemic drugs: Secondary | ICD-10-CM | POA: Diagnosis not present

## 2018-07-19 DIAGNOSIS — F0390 Unspecified dementia without behavioral disturbance: Secondary | ICD-10-CM | POA: Insufficient documentation

## 2018-07-19 DIAGNOSIS — I2510 Atherosclerotic heart disease of native coronary artery without angina pectoris: Secondary | ICD-10-CM | POA: Diagnosis not present

## 2018-07-19 DIAGNOSIS — Z87891 Personal history of nicotine dependence: Secondary | ICD-10-CM | POA: Insufficient documentation

## 2018-07-19 DIAGNOSIS — J189 Pneumonia, unspecified organism: Secondary | ICD-10-CM

## 2018-07-19 DIAGNOSIS — Z955 Presence of coronary angioplasty implant and graft: Secondary | ICD-10-CM | POA: Insufficient documentation

## 2018-07-19 LAB — CBG MONITORING, ED: Glucose-Capillary: 108 mg/dL — ABNORMAL HIGH (ref 70–99)

## 2018-07-19 NOTE — ED Notes (Signed)
Patient transported to CT 

## 2018-07-19 NOTE — ED Provider Notes (Signed)
[facility] Provider Note   CSN: [id number] Arrival date & time: 07/19/18  2313    History   Chief Complaint Chief Complaint  Patient presents with  . Altered Mental Status  Level 5 caveat due to dementia  HPI [name] is a 85 y.o. male.     The history is provided by the patient and a relative. The history is limited by the condition of the patient.  Altered Mental Status  Presenting symptoms: lethargy   Severity:  Moderate Timing:  Constant Progression:  Worsening Chronicity:  New Context: dementia   Patient presents from home for altered mental status.  Patient with history of dementia.  Reportedly has been lethargic.  Recent diagnosis of C. difficile and is on antibiotics.  Patient was hypotensive initially per EMS was improved  Past Medical History:  Diagnosis Date  . Anemia   . Blood dyscrasia    ' free bleeder' per patient   . Coronary artery disease    3 coronary stents- followed by Dr Gwenlyn Saranroituri   . Diabetes mellitus without complication (HCC)    type II   . Essential hypertension 10/22/2014  . GERD (gastroesophageal reflux disease)   . Multiple abrasions    on arms due to stumbling over chair on 11/19/16 per patient     Patient Active Problem List   Diagnosis Date Noted  . Chest pain 08/18/2017  . Near syncope 08/18/2017  . Leukocytosis 08/18/2017  . Acute metabolic encephalopathy 08/18/2017  . Cellulitis of right leg 08/18/2017  . Blood dyscrasia   . Anemia 07/03/2017  . Fall 07/03/2017  . Falls 07/03/2017  . 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 Dyslipidemia 10/05/2015  . CAD -S/P LAD and CFX DES 10/26/14 10/25/2014  . Abnormal nuclear cardiac imaging test 10/23/2014  . Carotid bruit-Rt-dopplers OK 10/23/2014  . GERD (gastroesophageal reflux disease) 10/23/2014  . Diabetes (HCC) 10/23/2014  . Essential hypertension 10/22/2014  . Chronic renal disease, stage III (HCC) 10/22/2014  . 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 Anginal pain (HCC) 10/21/2014  . [facility]    Past Surgical History:  Procedure Laterality Date  . CARDIAC CATHETERIZATION N/A 10/25/2014   Procedure: Left Heart Cath and Coronary Angiography;  Surgeon: Kathleene Hazelhristopher D McAlhany, MD;  [facility];  Service: Cardiovascular;  Laterality: N/A;  . CARDIAC CATHETERIZATION N/A 10/26/2014   Procedure: Coronary Stent Intervention;  Surgeon: Kathleene Hazelhristopher D McAlhany, MD;  Location: [facility] INVASIVE CV LAB;  Service: Cardiovascular;  Laterality: N/A;  . ESOPHAGOGASTRODUODENOSCOPY (EGD) WITH PROPOFOL N/A 11/25/2016   Procedure: ESOPHAGOGASTRODUODENOSCOPY (EGD) WITH PROPOFOL;  Surgeon: Carman ChingEdwards, James, MD;  Location: [facility] ENDOSCOPY;  Service: Endoscopy;  Laterality: N/A;  . HELLER MYOTOMY  12/17/2011   Procedure: LAPAROSCOPIC HELLER MYOTOMY;  Surgeon: Valarie MerinoMatthew B Martin, MD;  Location: [facility] ORS;  Service: General;  Laterality: N/A;  . HERNIA REPAIR  1952  . JOINT REPLACEMENT  2007   rt hip   . SAVORY DILATION N/A 11/25/2016   Procedure: SAVORY DILATION;  Surgeon: Carman ChingEdwards, James, MD;  Location: [facility] ENDOSCOPY;  Service: Endoscopy;  Laterality: N/A;  . UPPER GI ENDOSCOPY  12/17/2011   Procedure: UPPER GI ENDOSCOPY;  Surgeon: Valarie MerinoMatthew B Martin, MD;  Location: [facility] ORS;  Service: General;  Laterality: N/A;        Home Medications    Prior to Admission medications   Medication Sig Start Date End Date Taking? Authorizing Provider  acetaminophen (TYLENOL)  325 MG tablet Take 650 mg by mouth every 6 (six) hours as needed for mild pain, moderate pain, fever or headache.     [provider]  amLODipine (NORVASC) 2.5 MG tablet Take 2.5 mg by mouth every evening.    [provider]  atorvastatin (LIPITOR) 10 MG tablet Take 5 mg by mouth every evening.  02/21/17   [provider]  docusate sodium (COLACE) 100 MG capsule Take 100 mg by mouth 2 (two) times daily as  needed for mild constipation.    [provider]  ferrous sulfate 325 (65 FE) MG tablet Take 325 mg by mouth every Monday, Wednesday, and Friday.     [provider]  furosemide (LASIX) 20 MG tablet Take 1 tablet (20 mg total) by mouth every Monday, Wednesday, and Friday. 08/20/17   Delano Metz, MD  lidocaine (LIDODERM) 5 % Place 1 patch onto the skin daily. Remove & Discard patch within 12 hours or as directed by MD Patient not taking: Reported on 08/18/2017 07/05/17   Dimple Nanas, MD  metFORMIN (GLUCOPHAGE-XR) 500 MG 24 hr tablet Take 1 tablet (500 mg total) by mouth every morning. Patient not taking: Reported on 03/07/2018 10/29/14   Abelino Derrick, PA-C  metoprolol tartrate (LOPRESSOR) 25 MG tablet Take 25 mg by mouth 2 (two) times daily. 08/11/17   [provider]  multivitamin-lutein (OCUVITE-LUTEIN) CAPS capsule Take 1 capsule by mouth daily.    [provider]  nitroGLYCERIN (NITROSTAT) 0.4 MG SL tablet Place 0.4 mg under the tongue every 5 (five) minutes as needed for chest pain.    [provider]  omeprazole (PRILOSEC) 20 MG capsule Take 20 mg by mouth daily. 08/05/17   [provider]  oxycodone (OXY-IR) 5 MG capsule Take 1 capsule (5 mg total) by mouth every 4 (four) hours as needed (Severe Pain). Patient not taking: Reported on 08/18/2017 07/05/17   Dimple Nanas, MD  polyethylene glycol [facility]) packet Take 17 g by mouth daily as needed for moderate constipation or severe constipation. 07/05/17   Amin, Loura Halt, MD  senna (SENOKOT) 8.6 MG TABS tablet Take 1 tablet (8.6 mg total) by mouth at bedtime as needed for mild constipation. 07/05/17   Dimple Nanas, MD    Family History Family History  Problem Relation Age of Onset  . Heart disease Mother   . Heart disease Father     Social History Social History   Tobacco Use  . Smoking status: Former Games developer  . Smokeless tobacco: Never Used  . Tobacco comment:  stopped 74yrs ago  Substance Use Topics  . Alcohol use: No  . Drug use: No     Allergies   Patient has no known allergies.   Review of Systems Review of Systems  Unable to perform ROS: Dementia     Physical Exam Updated Vital Signs BP 135/66 (BP Location: Right Arm)   Pulse 66   Temp 98.8 F (37.1 C) (Oral)   Resp 16   Ht 1.753 m (5\' 9" )   Wt 73 kg   SpO2 99%   BMI 23.78 kg/m   Physical Exam  CONSTITUTIONAL: elderly, no acute distress HEAD: Normocephalic/atraumatic EYES: EOMI/PERRL ENMT: Mucous membranes moist NECK: supple no meningeal signs SPINE/BACK:entire spine nontender CV: S1/S2 noted, no murmurs/rubs/gallops noted LUNGS: Lungs are clear to auscultation bilaterally, no apparent distress ABDOMEN: soft, nontender  NEURO: Pt is awake/alert, maex4.  No focal weakness.  Pt follows commands but is minimally verbal.  He appears confused EXTREMITIES: pulses normal/equal, full ROM SKIN: warm, color normal PSYCH: unable to assess   ED Treatments / Results  Labs (all labs ordered are listed, but only abnormal results are displayed) Labs Reviewed  COMPREHENSIVE METABOLIC PANEL - Abnormal; Notable for the following components:      Result Value   Glucose, Bld 131 (*)    BUN 36 (*)    Creatinine, Ser 2.50 (*)    Total Protein 5.1 (*)    Albumin 2.9 (*)    AST 13 (*)    GFR calc non Af Amer 21 (*)    GFR calc Af Amer 25 (*)    All other components within normal limits  CBC WITH DIFFERENTIAL/PLATELET - Abnormal; Notable for the following components:   WBC 19.0 (*)    RBC 3.63 (*)    Hemoglobin 10.9 (*)    HCT 33.6 (*)    Neutro Abs 15.7 (*)    Abs Immature Granulocytes 0.10 (*)    All other components within normal limits  CBG MONITORING, ED - Abnormal; Notable for the following components:   Glucose-Capillary 108 (*)    All other components within normal limits  SARS CORONAVIRUS 2 (HOSPITAL ORDER, PERFORMED IN [facility])  ETHANOL   AMMONIA    EKG EKG Interpretation  Date/Time:  Sunday Jul 19 2018 23:13:28 EDT Ventricular Rate:  67 PR Interval:    QRS Duration: 125 QT Interval:  427 QTC Calculation: 451 R Axis:   127 Text Interpretation:  Sinus rhythm Short PR interval Nonspecific intraventricular conduction delay Probable anterior infarct, old Nonspecific repol abnormality, lateral leads Borderline ST elevation, lateral leads Baseline wander in lead(s) I III aVL Confirmed by Zadie Rhine ([id number]) on 07/19/2018 11:29:53 PM   Radiology Ct Head Wo Contrast  Result Date: 07/20/2018 CLINICAL DATA:  Altered LOC EXAM: CT HEAD WITHOUT CONTRAST TECHNIQUE: Contiguous axial images were obtained from the base of the skull through the vertex without intravenous contrast. COMPARISON:  08/18/2017 FINDINGS: Brain: No acute territorial infarction, hemorrhage or intracranial mass. Atrophy and small vessel ischemic changes of the white matter. Chronic left frontal lobe infarct. Stable prominent ventricles. Vascular: No hyperdense vessels.  Carotid vascular calcification Skull: Normal. Negative for fracture or focal lesion. Sinuses/Orbits: No acute finding. Other: None IMPRESSION: 1. No CT evidence for acute intracranial abnormality. 2. Atrophy, small vessel ischemic changes of the white matter and chronic left frontal lobe infarct Electronically Signed   By: Jasmine Pang M.D.   On: 07/20/2018 00:17   Dg Chest Port 1 View  Result Date: 07/19/2018 CLINICAL DATA:  Altered EXAM: PORTABLE CHEST 1 VIEW COMPARISON:  03/07/2018 FINDINGS: Small left-sided pleural effusion with left basilar airspace disease. Stable cardiomediastinal silhouette with vascular congestion. Aortic atherosclerosis. No pneumothorax. IMPRESSION: 1. Suspected small left effusion with left basilar airspace disease, atelectasis versus pneumonia 2. Stable slightly enlarged cardiomediastinal silhouette with central vascular congestion Electronically Signed   By: Jasmine Pang  M.D.   On: 07/19/2018 23:49    Procedures Procedures   Medications Ordered in ED Medications  sodium chloride 0.9 % bolus 1,000 mL (0 mLs Intravenous Stopped 07/20/18 0305)  cefTRIAXone (ROCEPHIN) 1 g in sodium chloride 0.9 % 100 mL IVPB (0 g Intravenous Stopped 07/20/18 0154)  doxycycline (VIBRA-TABS) tablet 100 mg (100 mg Oral Given 07/20/18 0120)  sodium chloride 0.9 % bolus 500 mL (0 mLs Intravenous Stopped 07/20/18 0503)     Initial Impression / Assessment and Plan / ED Course  I have reviewed the triage vital signs and the nursing notes.  Pertinent labs & imaging results that were available during my care of the patient were reviewed by me and considered in my medical decision making (see chart for details).        11:50 PM Nursing reports pt has had increased lethargy at home No diarrhea, and not currently treated for C. difficile, but is on prophylaxis. 1:09 AM Patient found to have pneumonia.  Mildly worsening renal function since prior He is in no distress, no hypoxia.  Plan for fluids and antibiotics 1:20 AM Conversation with patient's wife.  She reports has had some increased lethargy but is otherwise at baseline.  As of right now he does have pneumonia.  But he is in no distress.  They have essentially 24-hour nursing care. We agreed that he is safe for outpatient management.  COVID-19 test negative We will give him IV fluids/antibiotics here, and potentially discharge later in the morning 6:54 AM Patient stable overnight.  No acute distress. Plan for discharge, he has good nursing care at home and can take p.o. antibiotics.   [name] was evaluated in Emergency Department on 07/20/2018 for the symptoms described in the history of present illness. He was evaluated in the context of the global COVID-19 pandemic, which necessitated consideration that the patient might be at risk for infection with the SARS-CoV-2 virus that causes COVID-19. Institutional protocols and  algorithms that pertain to the evaluation of patients at risk for COVID-19 are in a state of rapid change based on information released by regulatory bodies including the CDC and federal and state organizations. These policies and algorithms were followed during the patient's care in the ED.  Final Clinical Impressions(s) / ED Diagnoses   Final diagnoses:  Community acquired pneumonia of left lower lobe of lung (HCC)  Renal insufficiency    ED Discharge Orders         Ordered    doxycycline (VIBRAMYCIN) 100 MG capsule  2 times daily     07/20/18 0122           Zadie Rhine, MD 07/20/18 [phone]

## 2018-07-19 NOTE — ED Triage Notes (Signed)
Pt. BIB EMS from home with home health RN. Per EMS nurse was concerned with pt becoming lethargic, low BP, and increased weakness. Pt. HX C. Diff currently being treated with antibiotics   Per EMS initial BP 90/40 given 500 mL bolus. Last BP 138/64.   LKW: 1200  Pt. Oriented to self; disoriented to place, time, and situation.

## 2018-07-20 LAB — CBC WITH DIFFERENTIAL/PLATELET
Abs Immature Granulocytes: 0.1 10*3/uL — ABNORMAL HIGH (ref 0.00–0.07)
Basophils Absolute: 0 10*3/uL (ref 0.0–0.1)
Basophils Relative: 0 %
Eosinophils Absolute: 0.2 10*3/uL (ref 0.0–0.5)
Eosinophils Relative: 1 %
HCT: 33.6 % — ABNORMAL LOW (ref 39.0–52.0)
Hemoglobin: 10.9 g/dL — ABNORMAL LOW (ref 13.0–17.0)
Immature Granulocytes: 1 %
Lymphocytes Relative: 12 %
Lymphs Abs: 2.2 10*3/uL (ref 0.7–4.0)
MCH: 30 pg (ref 26.0–34.0)
MCHC: 32.4 g/dL (ref 30.0–36.0)
MCV: 92.6 fL (ref 80.0–100.0)
Monocytes Absolute: 0.7 10*3/uL (ref 0.1–1.0)
Monocytes Relative: 4 %
Neutro Abs: 15.7 10*3/uL — ABNORMAL HIGH (ref 1.7–7.7)
Neutrophils Relative %: 82 %
Platelets: 181 10*3/uL (ref 150–400)
RBC: 3.63 MIL/uL — ABNORMAL LOW (ref 4.22–5.81)
RDW: 13.5 % (ref 11.5–15.5)
WBC: 19 10*3/uL — ABNORMAL HIGH (ref 4.0–10.5)
nRBC: 0 % (ref 0.0–0.2)

## 2018-07-20 LAB — COMPREHENSIVE METABOLIC PANEL
ALT: 12 U/L (ref 0–44)
AST: 13 U/L — ABNORMAL LOW (ref 15–41)
Albumin: 2.9 g/dL — ABNORMAL LOW (ref 3.5–5.0)
Alkaline Phosphatase: 56 U/L (ref 38–126)
Anion gap: 6 (ref 5–15)
BUN: 36 mg/dL — ABNORMAL HIGH (ref 8–23)
CO2: 23 mmol/L (ref 22–32)
Calcium: 9 mg/dL (ref 8.9–10.3)
Chloride: 106 mmol/L (ref 98–111)
Creatinine, Ser: 2.5 mg/dL — ABNORMAL HIGH (ref 0.61–1.24)
GFR calc Af Amer: 25 mL/min — ABNORMAL LOW (ref >60)
GFR calc non Af Amer: 21 mL/min — ABNORMAL LOW (ref >60)
Glucose, Bld: 131 mg/dL — ABNORMAL HIGH (ref 70–99)
Potassium: 4.2 mmol/L (ref 3.5–5.1)
Sodium: 135 mmol/L (ref 135–145)
Total Bilirubin: 0.8 mg/dL (ref 0.3–1.2)
Total Protein: 5.1 g/dL — ABNORMAL LOW (ref 6.5–8.1)

## 2018-07-20 LAB — SARS CORONAVIRUS 2 BY RT PCR (HOSPITAL ORDER, PERFORMED IN ~~LOC~~ HOSPITAL LAB): SARS Coronavirus 2: NEGATIVE

## 2018-07-20 LAB — ETHANOL: Alcohol, Ethyl (B): <10 mg/dL (ref <10)

## 2018-07-20 LAB — AMMONIA: Ammonia: 16 umol/L (ref 9–35)

## 2018-07-20 MED ORDER — SODIUM CHLORIDE 0.9 % IV BOLUS (SEPSIS)
1000.0000 mL | Freq: Once | INTRAVENOUS | Status: AC
  Administered 2018-07-20: 1000 mL via INTRAVENOUS

## 2018-07-20 MED ORDER — SODIUM CHLORIDE 0.9 % IV SOLN
1.0000 g | Freq: Once | INTRAVENOUS | Status: AC
  Administered 2018-07-20: 1 g via INTRAVENOUS
  Filled 2018-07-20: qty 10

## 2018-07-20 MED ORDER — DOXYCYCLINE HYCLATE 100 MG PO TABS
100.0000 mg | ORAL_TABLET | Freq: Once | ORAL | Status: AC
  Administered 2018-07-20: 100 mg via ORAL
  Filled 2018-07-20: qty 1

## 2018-07-20 MED ORDER — SODIUM CHLORIDE 0.9 % IV BOLUS (SEPSIS)
500.0000 mL | Freq: Once | INTRAVENOUS | Status: AC
  Administered 2018-07-20: 03:00:00 500 mL via INTRAVENOUS

## 2018-07-20 MED ORDER — DOXYCYCLINE HYCLATE 100 MG PO CAPS: 100.0000 mg | ORAL_CAPSULE | Freq: Two times a day (BID) | ORAL | 0 refills | Status: AC

## 2018-07-20 NOTE — ED Notes (Signed)
Spoke to pts wife. Per wife will be here within 20 minutes to pick up husband. Will call back with discharge instructions

## 2018-07-20 NOTE — ED Notes (Addendum)
D/t pts. Dementia, discharge instructions discussed with pt.'s wife. No questions at this time.   Pt. In room waiting to be picked up.

## 2018-07-28 ENCOUNTER — Other Ambulatory Visit: Payer: Self-pay | Admitting: Geriatric Medicine

## 2018-07-28 ENCOUNTER — Ambulatory Visit
Admission: RE | Admit: 2018-07-28 | Discharge: 2018-07-28 | Disposition: A | Discharge status: Home / Self Care | Payer: Medicare Other | Source: Ambulatory Visit | Attending: Geriatric Medicine | Admitting: Geriatric Medicine

## 2018-07-28 DIAGNOSIS — J189 Pneumonia, unspecified organism: Secondary | ICD-10-CM

## 2018-10-24 ENCOUNTER — Other Ambulatory Visit: Payer: Self-pay

## 2018-10-24 ENCOUNTER — Emergency Department (HOSPITAL_COMMUNITY): Payer: Medicare Other

## 2018-10-24 ENCOUNTER — Emergency Department (HOSPITAL_COMMUNITY)
Admission: EM | Admit: 2018-10-24 | Discharge: 2018-10-25 | Disposition: A | Discharge status: Home / Self Care | Payer: Medicare Other | Attending: Emergency Medicine | Admitting: Emergency Medicine

## 2018-10-24 ENCOUNTER — Encounter (HOSPITAL_COMMUNITY): Payer: Self-pay

## 2018-10-24 DIAGNOSIS — Z79899 Other long term (current) drug therapy: Secondary | ICD-10-CM | POA: Diagnosis not present

## 2018-10-24 DIAGNOSIS — S0091XA Abrasion of unspecified part of head, initial encounter: Secondary | ICD-10-CM | POA: Diagnosis not present

## 2018-10-24 DIAGNOSIS — S0990XA Unspecified injury of head, initial encounter: Secondary | ICD-10-CM | POA: Diagnosis present

## 2018-10-24 DIAGNOSIS — Y939 Activity, unspecified: Secondary | ICD-10-CM | POA: Insufficient documentation

## 2018-10-24 DIAGNOSIS — W19XXXA Unspecified fall, initial encounter: Secondary | ICD-10-CM | POA: Diagnosis not present

## 2018-10-24 DIAGNOSIS — N183 Chronic kidney disease, stage 3 (moderate): Secondary | ICD-10-CM | POA: Insufficient documentation

## 2018-10-24 DIAGNOSIS — Y999 Unspecified external cause status: Secondary | ICD-10-CM | POA: Diagnosis not present

## 2018-10-24 DIAGNOSIS — E119 Type 2 diabetes mellitus without complications: Secondary | ICD-10-CM | POA: Diagnosis not present

## 2018-10-24 DIAGNOSIS — I129 Hypertensive chronic kidney disease with stage 1 through stage 4 chronic kidney disease, or unspecified chronic kidney disease: Secondary | ICD-10-CM | POA: Diagnosis not present

## 2018-10-24 DIAGNOSIS — Y929 Unspecified place or not applicable: Secondary | ICD-10-CM | POA: Insufficient documentation

## 2018-10-24 DIAGNOSIS — I2510 Atherosclerotic heart disease of native coronary artery without angina pectoris: Secondary | ICD-10-CM | POA: Diagnosis not present

## 2018-10-24 DIAGNOSIS — Z87891 Personal history of nicotine dependence: Secondary | ICD-10-CM | POA: Diagnosis not present

## 2018-10-24 NOTE — ED Triage Notes (Signed)
Pt reports that he was bending over to pick up the toothpaste lid and fell. Denies LOC, he presents with a large laceration to the posterior aspect of his head. Bleeding controlled. Denies headache. Per daughter patient is mentating normally. Denies blood thinner use.

## 2018-10-24 NOTE — ED Provider Notes (Signed)
[facility]-EMERGENCY DEPT Provider Note   CSN: [id number] Arrival date & time: 10/24/18  2130     History   Chief Complaint Chief Complaint  Patient presents with   Laceration   Head Injury    HPI [name] is a [age] y.o. male with past medical history significant for falls and traumatic intracerebral hemorrhage who presents to the ER accompanied by his daughter for a new fall and resultant head trauma.  Patient maintains at this was a mechanical fall, sustained after attempting to pick up the cap for his toothpaste off of the floor.  Patient denies any prodrome.  Patient denies fainting, losing consciousness, or failing to remember the event.  On examination, patient was answering questions inappropriately and was not mentating clearly.  When asked about his previous intracerebral hemorrhage, he stated the year he was born. His daughter claims that this is not particularly unusual for him, that it comes and goes.  Patient's daughter reports that it is been this way and he has had numerous falls since sustaining his first head injury 3 years ago.  Patient and his daughter deny any blood thinners.  He denies any chest pain, shortness of breath, dizziness, visual changes, vomiting, or loss of consciousness.     HPI  Past Medical History:  Diagnosis Date   Anemia    Blood dyscrasia    ' free bleeder' per patient    Coronary artery disease    3 coronary stents- followed by Dr Gwenlyn Saranroituri    Diabetes mellitus without complication (HCC)    type II    Essential hypertension 10/22/2014   GERD (gastroesophageal reflux disease)    Multiple abrasions    on arms due to stumbling over chair on 11/19/16 per patient     Patient Active Problem List   Diagnosis Date Noted   Chest pain 08/18/2017   Near syncope 08/18/2017   Leukocytosis 08/18/2017   Acute metabolic encephalopathy 08/18/2017   Cellulitis of right leg 08/18/2017   Blood dyscrasia    Anemia  07/03/2017   Fall 07/03/2017   Falls 07/03/2017   Traumatic intracerebral hemorrhage (HCC) 04/23/2017   Dyslipidemia 10/05/2015   CAD -S/P LAD and CFX DES 10/26/14 10/25/2014   Abnormal nuclear cardiac imaging test 10/23/2014   Carotid bruit-Rt-dopplers OK 10/23/2014   GERD (gastroesophageal reflux disease) 10/23/2014   Diabetes (HCC) 10/23/2014   Essential hypertension 10/22/2014   Chronic renal disease, stage III (HCC) 10/22/2014   DJD (degenerative joint disease), 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   Anginal pain (HCC) 10/21/2014   Lap Heller Myotomy Oct 2013 05/07/2012    Past Surgical History:  Procedure Laterality Date   CARDIAC CATHETERIZATION N/A 10/25/2014   Procedure: Left Heart Cath and Coronary Angiography;  Surgeon: Kathleene Hazelhristopher D McAlhany, MD;  [facility];  Service: Cardiovascular;  Laterality: N/A;   CARDIAC CATHETERIZATION N/A 10/26/2014   Procedure: Coronary Stent Intervention;  Surgeon: Kathleene Hazelhristopher D McAlhany, MD;  [facility];  Service: Cardiovascular;  Laterality: N/A;   ESOPHAGOGASTRODUODENOSCOPY (EGD) WITH PROPOFOL N/A 11/25/2016   Procedure: ESOPHAGOGASTRODUODENOSCOPY (EGD) WITH PROPOFOL;  Surgeon: Carman ChingEdwards, James, MD;  Location: [facility] ENDOS[facility];  Service: Endoscopy;  Laterality: N/A;   HELLER MYOTOMY  12/17/2011   Procedure: LAPAROSCOPIC HELLER MYOTOMY;  Surgeon: Valarie MerinoMatthew B Martin, MD;  Location: [facility] ORS;  Service: General;  Laterality: N/A;   HERNIA REPAIR  1952   JOINT REPLACEMENT  2007   rt hip    SAVORY DILATION N/A 11/25/2016  Procedure: SAVORY DILATION;  Surgeon: Laurence Spates, MD;  Location: [facility];  Service: Endoscopy;  Laterality: N/A;   UPPER GI ENDOSCOPY  12/17/2011   Procedure: UPPER GI ENDOSCOPY;  Surgeon: Pedro Earls, MD;  Location: [facility] OR[facility];  Service: General;  Laterality: N/A;        Home Medications    Prior to Admission medications   Medication Sig Start Date End Date Taking? Authorizing Provider   acetaminophen (TYLENOL) 325 MG tablet Take 650 mg by mouth every 6 (six) hours as needed for mild pain, moderate pain, fever or headache.     [provider]  amLODipine (NORVASC) 2.5 MG tablet Take 2.5 mg by mouth every evening.    [provider]  atorvastatin (LIPITOR) 10 MG tablet Take 5 mg by mouth every evening.  02/21/17   [provider]  docusate sodium (COLACE) 100 MG capsule Take 100 mg by mouth 2 (two) times daily as needed for mild constipation.    [provider]  doxycycline (VIBRAMYCIN) 100 MG capsule Take 1 capsule (100 mg total) by mouth 2 (two) times daily. One po bid x 7 days 07/20/18   Ripley Fraise, MD  ferrous sulfate 325 (65 FE) MG tablet Take 325 mg by mouth every Monday, Wednesday, and Friday.     [provider]  furosemide (LASIX) 20 MG tablet Take 1 tablet (20 mg total) by mouth every Monday, Wednesday, and Friday. 08/20/17   Roney Jaffe, MD  lidocaine (LIDODERM) 5 % Place 1 patch onto the skin daily. Remove & Discard patch within 12 hours or as directed by MD Patient not taking: Reported on 08/18/2017 07/05/17   Damita Lack, MD  metFORMIN (GLUCOPHAGE-XR) 500 MG 24 hr tablet Take 1 tablet (500 mg total) by mouth every morning. Patient not taking: Reported on 03/07/2018 10/29/14   Erlene Quan, PA-C  metoprolol tartrate (LOPRESSOR) 25 MG tablet Take 25 mg by mouth 2 (two) times daily. 08/11/17   [provider]  multivitamin-lutein (OCUVITE-LUTEIN) CAPS capsule Take 1 capsule by mouth daily.    [provider]  nitroGLYCERIN (NITROSTAT) 0.4 MG SL tablet Place 0.4 mg under the tongue every 5 (five) minutes as needed for chest pain.    [provider]  omeprazole (PRILOSEC) 20 MG capsule Take 20 mg by mouth daily. 08/05/17   [provider]  polyethylene glycol (MIRALAX) packet Take 17 g by mouth daily as needed for moderate constipation or severe constipation. 07/05/17   Amin, Jeanella Flattery,  MD  senna (SENOKOT) 8.6 MG TABS tablet Take 1 tablet (8.6 mg total) by mouth at bedtime as needed for mild constipation. 07/05/17   Damita Lack, MD    Family History Family History  Problem Relation Age of Onset   Heart disease Mother    Heart disease Father     Social History Social History   Tobacco Use   Smoking status: Former Smoker   Smokeless tobacco: Never Used   Tobacco comment: stopped 47yrs ago  Substance Use Topics   Alcohol use: No   Drug use: No     Allergies   Patient has no known allergies.   Review of Systems Review of Systems Ten systems are reviewed and are negative for acute change except as noted in the HPI   Physical Exam Updated Vital Signs BP (!) 167/75 (BP Location: Left Arm)    Pulse 71    Temp 98.2 F (36.8 C) (Oral)    Resp 18  SpO2 98%   Physical Exam HENT:     Head:     Comments: Quarter-sized abrasion at midline occipitoparietal junction. Hemostasis achieved. Mild swelling, but no obvious deformity or bruising.     Right Ear: Tympanic membrane normal.     Left Ear: Tympanic membrane normal.     Ears:     Comments: No hemotympanum visualized. Eyes:     Extraocular Movements: Extraocular movements intact.     Pupils: Pupils are equal, round, and reactive to light.  Neck:     Musculoskeletal: Normal range of motion.  Cardiovascular:     Rate and Rhythm: Normal rate and regular rhythm.     Pulses: Normal pulses.  Pulmonary:     Effort: Pulmonary effort is normal.     Breath sounds: Normal breath sounds.  Abdominal:     General: Abdomen is flat.     Palpations: Abdomen is soft.     Tenderness: There is no guarding.  Musculoskeletal:        General: Deformity: yao.     Comments: Mild C-spine tenderness to palpation. No deformity appreciated. No evidence of bruising or swelling.   Neurological:     Mental Status: He is alert.     Comments: Negative Romberg and Cerebellar testing. Answering some questions  inappropriately. Unsure if altered or if baseline. No sensory deficit.       ED Treatments / Results  Labs (all labs ordered are listed, but only abnormal results are displayed) Labs Reviewed - No data to display  EKG None  Radiology Ct Head Wo Contrast  Result Date: 10/24/2018 CLINICAL DATA:  Fall.  Laceration to posterior head. EXAM: CT HEAD WITHOUT CONTRAST CT CERVICAL SPINE WITHOUT CONTRAST TECHNIQUE: Multidetector CT imaging of the head and cervical spine was performed following the standard protocol without intravenous contrast. Multiplanar CT image reconstructions of the cervical spine were also generated. COMPARISON:  07/19/2018 FINDINGS: CT HEAD FINDINGS Brain: There is atrophy and chronic small vessel disease changes. No acute intracranial abnormality. Specifically, no hemorrhage, hydrocephalus, mass lesion, acute infarction, or significant intracranial injury. Vascular: No hyperdense vessel or unexpected calcification. Skull: No acute calvarial abnormality. Sinuses/Orbits: Visualized paranasal sinuses and mastoids clear. Orbital soft tissues unremarkable. Other: None CT CERVICAL SPINE FINDINGS Alignment: Slight anterolisthesis of C7 on T1 related to facet disease. Skull base and vertebrae: No acute fracture. No primary bone lesion or focal pathologic process. Soft tissues and spinal canal: No prevertebral fluid or swelling. No visible canal hematoma. Disc levels:  Diffuse degenerative disc disease and facet disease. Upper chest: No acute findings Other: None IMPRESSION: Atrophy, chronic microvascular disease. No acute intracranial abnormality. Diffuse cervical spondylosis.  No acute bony abnormality. Electronically Signed   By: Charlett NoseKevin  Dover M.D.   On: 10/24/2018 23:37   Ct Cervical Spine Wo Contrast  Result Date: 10/24/2018 CLINICAL DATA:  Fall.  Laceration to posterior head. EXAM: CT HEAD WITHOUT CONTRAST CT CERVICAL SPINE WITHOUT CONTRAST TECHNIQUE: Multidetector CT imaging of the head  and cervical spine was performed following the standard protocol without intravenous contrast. Multiplanar CT image reconstructions of the cervical spine were also generated. COMPARISON:  07/19/2018 FINDINGS: CT HEAD FINDINGS Brain: There is atrophy and chronic small vessel disease changes. No acute intracranial abnormality. Specifically, no hemorrhage, hydrocephalus, mass lesion, acute infarction, or significant intracranial injury. Vascular: No hyperdense vessel or unexpected calcification. Skull: No acute calvarial abnormality. Sinuses/Orbits: Visualized paranasal sinuses and mastoids clear. Orbital soft tissues unremarkable. Other: None CT CERVICAL SPINE FINDINGS Alignment: Slight  anterolisthesis of C7 on T1 related to facet disease. Skull base and vertebrae: No acute fracture. No primary bone lesion or focal pathologic process. Soft tissues and spinal canal: No prevertebral fluid or swelling. No visible canal hematoma. Disc levels:  Diffuse degenerative disc disease and facet disease. Upper chest: No acute findings Other: None IMPRESSION: Atrophy, chronic microvascular disease. No acute intracranial abnormality. Diffuse cervical spondylosis.  No acute bony abnormality. Electronically Signed   By: Charlett Nose M.D.   On: 10/24/2018 23:37    Procedures Procedures (including critical care time)  Medications Ordered in ED Medications - No data to display   Initial Impression / Assessment and Plan / ED Course  I have reviewed the triage vital signs and the nursing notes.  Pertinent labs & imaging results that were available during my care of the patient were reviewed by me and considered in my medical decision making (see chart for details).        Ordered CT head and neck given patient's evidence of head trauma, history of traumatic intracerebral hemorrhage, mild cervical tenderness to palpation, and answering questions inappropriately. Patient also reports mild nausea, but does not feel as  though he will vomit. Discussed with patient and daughter why I felt that this imaging was appropriate, they voiced understanding and agreed to plan.   12:40 AM Viewed and interpreted CT imaging which were reassuring and did not demonstrate any fracture or intracerebral bleeding. Vital signs stable and daughter and patient can tolerate PO well. Patient will be discharged with strict return precautions. Cleaned and bandaged abrasion. Ambulated with patient utilizing a walker. Patient had no new difficulty and has 24 hr support at home. Strict return precautions provided to the patient.     Final Clinical Impressions(s) / ED Diagnoses   Final diagnoses:  Fall, initial encounter  Abrasion of head, initial encounter    ED Discharge Orders    None       Lorelee New, PA-C 10/25/18 0041    Derwood Kaplan, MD 10/25/18 9021

## 2018-10-25 NOTE — Discharge Instructions (Signed)
Please review attachments. Head CT reassuring, but return to ED for any new or worsening neurologic deficits, altered mentation, or changes in behavior. Tylenol as needed for pain. Will have follow up with pcp as needed.

## 2018-10-25 NOTE — ED Notes (Signed)
Patient ambulated with walker and minimal assistance. Wound on posterior head cleaned and dressing applied.

## 2018-11-12 ENCOUNTER — Other Ambulatory Visit: Payer: Self-pay | Admitting: Geriatric Medicine

## 2018-11-12 DIAGNOSIS — R1110 Vomiting, unspecified: Secondary | ICD-10-CM

## 2018-11-13 ENCOUNTER — Other Ambulatory Visit: Payer: Self-pay | Admitting: Geriatric Medicine

## 2018-11-13 ENCOUNTER — Ambulatory Visit
Admission: RE | Admit: 2018-11-13 | Discharge: 2018-11-13 | Disposition: A | Discharge status: Home / Self Care | Payer: Medicare Other | Source: Ambulatory Visit | Attending: Geriatric Medicine | Admitting: Geriatric Medicine

## 2018-11-13 DIAGNOSIS — R1110 Vomiting, unspecified: Secondary | ICD-10-CM

## 2019-02-16 ENCOUNTER — Other Ambulatory Visit: Payer: Self-pay

## 2019-02-16 ENCOUNTER — Emergency Department (HOSPITAL_COMMUNITY): Payer: Medicare Other

## 2019-02-16 ENCOUNTER — Inpatient Hospital Stay (HOSPITAL_COMMUNITY): Payer: Medicare Other

## 2019-02-16 ENCOUNTER — Inpatient Hospital Stay (HOSPITAL_COMMUNITY)
Admission: EM | Admit: 2019-02-16 | Discharge: 2019-03-22 | DRG: 480 | Disposition: E | Payer: Medicare Other | Attending: Internal Medicine | Admitting: Internal Medicine

## 2019-02-16 DIAGNOSIS — E1122 Type 2 diabetes mellitus with diabetic chronic kidney disease: Secondary | ICD-10-CM | POA: Diagnosis present

## 2019-02-16 DIAGNOSIS — Z8249 Family history of ischemic heart disease and other diseases of the circulatory system: Secondary | ICD-10-CM | POA: Diagnosis not present

## 2019-02-16 DIAGNOSIS — K219 Gastro-esophageal reflux disease without esophagitis: Secondary | ICD-10-CM | POA: Diagnosis present

## 2019-02-16 DIAGNOSIS — J9811 Atelectasis: Secondary | ICD-10-CM | POA: Diagnosis present

## 2019-02-16 DIAGNOSIS — R778 Other specified abnormalities of plasma proteins: Secondary | ICD-10-CM

## 2019-02-16 DIAGNOSIS — Z515 Encounter for palliative care: Secondary | ICD-10-CM | POA: Diagnosis not present

## 2019-02-16 DIAGNOSIS — I248 Other forms of acute ischemic heart disease: Secondary | ICD-10-CM | POA: Diagnosis present

## 2019-02-16 DIAGNOSIS — I129 Hypertensive chronic kidney disease with stage 1 through stage 4 chronic kidney disease, or unspecified chronic kidney disease: Secondary | ICD-10-CM | POA: Diagnosis present

## 2019-02-16 DIAGNOSIS — I6523 Occlusion and stenosis of bilateral carotid arteries: Secondary | ICD-10-CM | POA: Diagnosis present

## 2019-02-16 DIAGNOSIS — Z01811 Encounter for preprocedural respiratory examination: Secondary | ICD-10-CM

## 2019-02-16 DIAGNOSIS — G9341 Metabolic encephalopathy: Secondary | ICD-10-CM | POA: Diagnosis present

## 2019-02-16 DIAGNOSIS — Z9861 Coronary angioplasty status: Secondary | ICD-10-CM | POA: Diagnosis not present

## 2019-02-16 DIAGNOSIS — S72002A Fracture of unspecified part of neck of left femur, initial encounter for closed fracture: Secondary | ICD-10-CM

## 2019-02-16 DIAGNOSIS — I2510 Atherosclerotic heart disease of native coronary artery without angina pectoris: Secondary | ICD-10-CM | POA: Diagnosis present

## 2019-02-16 DIAGNOSIS — E785 Hyperlipidemia, unspecified: Secondary | ICD-10-CM | POA: Diagnosis present

## 2019-02-16 DIAGNOSIS — R509 Fever, unspecified: Secondary | ICD-10-CM

## 2019-02-16 DIAGNOSIS — Z96641 Presence of right artificial hip joint: Secondary | ICD-10-CM | POA: Diagnosis present

## 2019-02-16 DIAGNOSIS — Z87891 Personal history of nicotine dependence: Secondary | ICD-10-CM | POA: Diagnosis not present

## 2019-02-16 DIAGNOSIS — N183 Chronic kidney disease, stage 3 unspecified: Secondary | ICD-10-CM | POA: Diagnosis present

## 2019-02-16 DIAGNOSIS — Z781 Physical restraint status: Secondary | ICD-10-CM

## 2019-02-16 DIAGNOSIS — I10 Essential (primary) hypertension: Secondary | ICD-10-CM | POA: Diagnosis not present

## 2019-02-16 DIAGNOSIS — D72829 Elevated white blood cell count, unspecified: Secondary | ICD-10-CM | POA: Diagnosis present

## 2019-02-16 DIAGNOSIS — W19XXXA Unspecified fall, initial encounter: Secondary | ICD-10-CM | POA: Diagnosis present

## 2019-02-16 DIAGNOSIS — Y92009 Unspecified place in unspecified non-institutional (private) residence as the place of occurrence of the external cause: Secondary | ICD-10-CM

## 2019-02-16 DIAGNOSIS — D509 Iron deficiency anemia, unspecified: Secondary | ICD-10-CM | POA: Diagnosis present

## 2019-02-16 DIAGNOSIS — Z6825 Body mass index (BMI) 25.0-25.9, adult: Secondary | ICD-10-CM | POA: Diagnosis not present

## 2019-02-16 DIAGNOSIS — N1832 Chronic kidney disease, stage 3b: Secondary | ICD-10-CM | POA: Diagnosis present

## 2019-02-16 DIAGNOSIS — R627 Adult failure to thrive: Secondary | ICD-10-CM | POA: Diagnosis present

## 2019-02-16 DIAGNOSIS — I340 Nonrheumatic mitral (valve) insufficiency: Secondary | ICD-10-CM | POA: Diagnosis not present

## 2019-02-16 DIAGNOSIS — Z955 Presence of coronary angioplasty implant and graft: Secondary | ICD-10-CM | POA: Diagnosis not present

## 2019-02-16 DIAGNOSIS — M80052A Age-related osteoporosis with current pathological fracture, left femur, initial encounter for fracture: Secondary | ICD-10-CM | POA: Diagnosis present

## 2019-02-16 DIAGNOSIS — M80059A Age-related osteoporosis with current pathological fracture, unspecified femur, initial encounter for fracture: Secondary | ICD-10-CM | POA: Diagnosis present

## 2019-02-16 DIAGNOSIS — R1310 Dysphagia, unspecified: Secondary | ICD-10-CM | POA: Diagnosis present

## 2019-02-16 DIAGNOSIS — Z20822 Contact with and (suspected) exposure to covid-19: Secondary | ICD-10-CM | POA: Diagnosis present

## 2019-02-16 DIAGNOSIS — Z66 Do not resuscitate: Secondary | ICD-10-CM | POA: Diagnosis present

## 2019-02-16 DIAGNOSIS — Z79899 Other long term (current) drug therapy: Secondary | ICD-10-CM

## 2019-02-16 DIAGNOSIS — Z7984 Long term (current) use of oral hypoglycemic drugs: Secondary | ICD-10-CM

## 2019-02-16 DIAGNOSIS — I350 Nonrheumatic aortic (valve) stenosis: Secondary | ICD-10-CM | POA: Diagnosis not present

## 2019-02-16 DIAGNOSIS — R531 Weakness: Secondary | ICD-10-CM

## 2019-02-16 LAB — CBC
HCT: 35.9 % — ABNORMAL LOW (ref 39.0–52.0)
Hemoglobin: 11.5 g/dL — ABNORMAL LOW (ref 13.0–17.0)
MCH: 30.2 pg (ref 26.0–34.0)
MCHC: 32 g/dL (ref 30.0–36.0)
MCV: 94.2 fL (ref 80.0–100.0)
Platelets: 177 10*3/uL (ref 150–400)
RBC: 3.81 MIL/uL — ABNORMAL LOW (ref 4.22–5.81)
RDW: 14 % (ref 11.5–15.5)
WBC: 18.6 10*3/uL — ABNORMAL HIGH (ref 4.0–10.5)
nRBC: 0 % (ref 0.0–0.2)

## 2019-02-16 LAB — BASIC METABOLIC PANEL
Anion gap: 9 (ref 5–15)
BUN: 33 mg/dL — ABNORMAL HIGH (ref 8–23)
CO2: 21 mmol/L — ABNORMAL LOW (ref 22–32)
Calcium: 9 mg/dL (ref 8.9–10.3)
Chloride: 108 mmol/L (ref 98–111)
Creatinine, Ser: 2 mg/dL — ABNORMAL HIGH (ref 0.61–1.24)
GFR calc Af Amer: 32 mL/min — ABNORMAL LOW (ref >60)
GFR calc non Af Amer: 28 mL/min — ABNORMAL LOW (ref >60)
Glucose, Bld: 147 mg/dL — ABNORMAL HIGH (ref 70–99)
Potassium: 4.1 mmol/L (ref 3.5–5.1)
Sodium: 138 mmol/L (ref 135–145)

## 2019-02-16 LAB — DIFFERENTIAL
Abs Immature Granulocytes: 0.11 10*3/uL — ABNORMAL HIGH (ref 0.00–0.07)
Basophils Absolute: 0.1 10*3/uL (ref 0.0–0.1)
Basophils Relative: 0 %
Eosinophils Absolute: 0.3 10*3/uL (ref 0.0–0.5)
Eosinophils Relative: 2 %
Immature Granulocytes: 1 %
Lymphocytes Relative: 8 %
Lymphs Abs: 1.4 10*3/uL (ref 0.7–4.0)
Monocytes Absolute: 1 10*3/uL (ref 0.1–1.0)
Monocytes Relative: 5 %
Neutro Abs: 15.7 10*3/uL — ABNORMAL HIGH (ref 1.7–7.7)
Neutrophils Relative %: 84 %

## 2019-02-16 LAB — URINALYSIS, ROUTINE W REFLEX MICROSCOPIC
Bacteria, UA: NONE SEEN
Bilirubin Urine: NEGATIVE
Glucose, UA: 50 mg/dL — AB
Hgb urine dipstick: NEGATIVE
Ketones, ur: NEGATIVE mg/dL
Leukocytes,Ua: NEGATIVE
Nitrite: NEGATIVE
Protein, ur: 30 mg/dL — AB
Specific Gravity, Urine: 1.018 (ref 1.005–1.030)
pH: 5 (ref 5.0–8.0)

## 2019-02-16 LAB — TROPONIN I (HIGH SENSITIVITY)
Troponin I (High Sensitivity): 186 ng/L (ref <18)
Troponin I (High Sensitivity): 213 ng/L (ref <18)

## 2019-02-16 LAB — PROTIME-INR
INR: 1.1 (ref 0.8–1.2)
Prothrombin Time: 14.5 seconds (ref 11.4–15.2)

## 2019-02-16 LAB — HEPATIC FUNCTION PANEL
ALT: 14 U/L (ref 0–44)
AST: 19 U/L (ref 15–41)
Albumin: 3.1 g/dL — ABNORMAL LOW (ref 3.5–5.0)
Alkaline Phosphatase: 48 U/L (ref 38–126)
Bilirubin, Direct: 0.2 mg/dL (ref 0.0–0.2)
Indirect Bilirubin: 1.5 mg/dL — ABNORMAL HIGH (ref 0.3–0.9)
Total Bilirubin: 1.7 mg/dL — ABNORMAL HIGH (ref 0.3–1.2)
Total Protein: 5.6 g/dL — ABNORMAL LOW (ref 6.5–8.1)

## 2019-02-16 LAB — POC SARS CORONAVIRUS 2 AG -  ED: SARS Coronavirus 2 Ag: NEGATIVE

## 2019-02-16 MED ORDER — HYDROCODONE-ACETAMINOPHEN 5-325 MG PO TABS: 1.0000 | ORAL_TABLET | Freq: Four times a day (QID) | ORAL | Status: DC | PRN

## 2019-02-16 MED ORDER — SODIUM CHLORIDE 0.9 % IV BOLUS
500.0000 mL | Freq: Once | INTRAVENOUS | Status: AC
  Administered 2019-02-16: 500 mL via INTRAVENOUS

## 2019-02-16 MED ORDER — SODIUM CHLORIDE 0.9% FLUSH: 3.0000 mL | Freq: Once | INTRAVENOUS | Status: DC

## 2019-02-16 MED ORDER — POLYETHYLENE GLYCOL 3350 17 G PO PACK: 17.0000 g | PACK | Freq: Every day | ORAL | Status: DC | PRN

## 2019-02-16 MED ORDER — AMLODIPINE BESYLATE 5 MG PO TABS
2.5000 mg | ORAL_TABLET | Freq: Every evening | ORAL | Status: DC
  Administered 2019-02-16 – 2019-02-18 (×2): 2.5 mg via ORAL
  Filled 2019-02-16 (×2): qty 1

## 2019-02-16 MED ORDER — SENNA 8.6 MG PO TABS
1.0000 | ORAL_TABLET | Freq: Two times a day (BID) | ORAL | Status: DC
  Administered 2019-02-16 – 2019-02-17 (×2): 8.6 mg via ORAL
  Filled 2019-02-16 (×5): qty 1

## 2019-02-16 MED ORDER — BISACODYL 10 MG RE SUPP: 10.0000 mg | Freq: Every day | RECTAL | Status: DC | PRN

## 2019-02-16 MED ORDER — METOPROLOL TARTRATE 25 MG PO TABS
25.0000 mg | ORAL_TABLET | Freq: Two times a day (BID) | ORAL | Status: DC
  Administered 2019-02-16 – 2019-02-17 (×2): 25 mg via ORAL
  Filled 2019-02-16 (×2): qty 1

## 2019-02-16 MED ORDER — PANTOPRAZOLE SODIUM 40 MG PO TBEC
40.0000 mg | DELAYED_RELEASE_TABLET | Freq: Every day | ORAL | Status: DC
  Administered 2019-02-17: 40 mg via ORAL
  Filled 2019-02-16: qty 1

## 2019-02-16 MED ORDER — ACETAMINOPHEN 325 MG PO TABS
650.0000 mg | ORAL_TABLET | Freq: Four times a day (QID) | ORAL | Status: DC | PRN
  Filled 2019-02-16: qty 2

## 2019-02-16 MED ORDER — FERROUS SULFATE 325 (65 FE) MG PO TABS
325.0000 mg | ORAL_TABLET | ORAL | Status: DC
  Administered 2019-02-17: 325 mg via ORAL
  Filled 2019-02-16: qty 1

## 2019-02-16 MED ORDER — DEXTROSE IN LACTATED RINGERS 5 % IV SOLN: INTRAVENOUS | Status: DC

## 2019-02-16 MED ORDER — DOCUSATE SODIUM 100 MG PO CAPS: 100.0000 mg | ORAL_CAPSULE | Freq: Two times a day (BID) | ORAL | Status: DC | PRN

## 2019-02-16 MED ORDER — MORPHINE SULFATE (PF) 2 MG/ML IV SOLN: 2.0000 mg | INTRAVENOUS | Status: DC | PRN

## 2019-02-16 NOTE — ED Notes (Signed)
Attempted to call report for pt transfer to the floor. Will try again in a few minutes

## 2019-02-16 NOTE — ED Notes (Signed)
Date and time results received: 02/06/2019 1605 (use smartphrase ".now" to insert current time)  Test: Trop Critical Value: 186  Name of Provider Notified: [id number]  Orders Received? Or Actions Taken?: Orders Received - See Orders for details

## 2019-02-16 NOTE — ED Triage Notes (Signed)
Transported by [facility] from [facility]-- has full time caregiver. Caregiver reports increased confusion, weakness, left leg pain, and generalized abdominal pain that started at 3 am. Patient has history of dementia but is AAO x 2.

## 2019-02-16 NOTE — ED Notes (Signed)
Patient transported to CT 

## 2019-02-16 NOTE — ED Notes (Signed)
ED TO INPATIENT HANDOFF REPORT  ED Nurse Name and Phone #: Jahvon Gosline   S Name/Age/Gender [name] 85 y.o. male Room/Bed: WA03/WA03  Code Status   Code Status: DNR  Home/SNF/Other Nursing Home Patient oriented to: self Is this baseline? No , pt is normally AOx2  Triage Complete: Triage complete  Chief Complaint Hip fracture due to osteoporosis [facility]) [M80.059A]  Triage Note Transported by [facility] from [place]-- has full time caregiver. Caregiver reports increased confusion, weakness, left leg pain, and generalized abdominal pain that started at 3 am. Patient has history of dementia but is AAO x 2.     Allergies No Known Allergies  Level of Care/Admitting Diagnosis ED Disposition    ED Disposition Condition Comment   Admit  Hospital Area: [place] [[id number]]  Level of Care: Telemetry [5]  Admit to tele based on following criteria: Eval of Syncope  Covid Evaluation: Symptomatic Person Under Investigation (PUI)  Diagnosis: Hip fracture due to osteoporosis [facility]) [[id number]]  Admitting Physician: Lavina Hamman [[id number]]  Attending Physician: Lavina Hamman [[id number]]  Estimated length of stay: past midnight tomorrow  Certification:: I certify this patient will need inpatient services for at least 2 midnights       B Medical/Surgery History Past Medical History:  Diagnosis Date  . Anemia   . Blood dyscrasia    ' free bleeder' per patient   . Coronary artery disease    3 coronary stents- followed by Dr Loleta Chance   . Diabetes mellitus without complication ([place])    type II   . Essential hypertension 10/22/2014  . GERD (gastroesophageal reflux disease)   . Multiple abrasions    on arms due to stumbling over chair on 11/19/16 per patient    Past Surgical History:  Procedure Laterality Date  . CARDIAC CATHETERIZATION N/A 10/25/2014   Procedure: Left Heart Cath and Coronary Angiography;  Surgeon: Burnell Blanks, MD;  Location:  [place] [facility];  Service: Cardiovascular;  Laterality: N/A;  . CARDIAC CATHETERIZATION N/A 10/26/2014   Procedure: Coronary Stent Intervention;  Surgeon: Burnell Blanks, MD;  Location: [place] [facility];  Service: Cardiovascular;  Laterality: N/A;  . ESOPHAGOGASTRODUODENOSCOPY (EGD) WITH PROPOFOL N/A 11/25/2016   Procedure: ESOPHAGOGASTRODUODENOSCOPY (EGD) WITH PROPOFOL;  Surgeon: Laurence Spates, MD;  Location: [facility];  Service: Endoscopy;  Laterality: N/A;  . HELLER MYOTOMY  12/17/2011   Procedure: LAPAROSCOPIC HELLER MYOTOMY;  Surgeon: Pedro Earls, MD;  Location: [facility];  Service: General;  Laterality: N/A;  . [place]  . JOINT REPLACEMENT  2007   rt hip   . SAVORY DILATION N/A 11/25/2016   Procedure: SAVORY DILATION;  Surgeon: Laurence Spates, MD;  Location: [facility];  Service: Endoscopy;  Laterality: N/A;  . UPPER GI ENDOSCOPY  12/17/2011   Procedure: UPPER GI ENDOSCOPY;  Surgeon: Pedro Earls, MD;  Location: [facility];  Service: General;  Laterality: N/A;     A IV Location/Drains/Wounds Patient Lines/Drains/Airways Status   Active Line/Drains/Airways    Name:   Placement date:   Placement time:   Site:   Days:   Peripheral IV 01/20/2019 Right Forearm   01/21/2019    1454    Forearm   less than 1          Intake/Output Last 24 hours No intake or output data in the 24 hours ending 02/18/2019 2022  Labs/Imaging Results for orders placed or performed during the hospital encounter of 02/02/2019 (from  the past 48 hour(s))  Basic metabolic panel     Status: Abnormal   Collection Time: 02/11/2019  1:41 PM  Result Value Ref Range   Sodium 138 135 - 145 mmol/L   Potassium 4.1 3.5 - 5.1 mmol/L   Chloride 108 98 - 111 mmol/L   CO2 21 (L) 22 - 32 mmol/L   Glucose, Bld 147 (H) 70 - 99 mg/dL   BUN 33 (H) 8 - 23 mg/dL   Creatinine, Ser 2.00 (H) 0.61 - 1.24 mg/dL   Calcium 9.0 8.9 - 10.3 mg/dL   GFR calc non Af Amer 28 (L) >60 mL/min   GFR calc Af Amer 32 (L)  >60 mL/min   Anion gap 9 5 - 15    Comment: Performed at [facility], [place] [address] [place], [place] [address]  CBC     Status: Abnormal   Collection Time: 02/13/2019  1:41 PM  Result Value Ref Range   WBC 18.6 (H) 4.0 - 10.5 K/uL   RBC 3.81 (L) 4.22 - 5.81 MIL/uL   Hemoglobin 11.5 (L) 13.0 - 17.0 g/dL   HCT 35.9 (L) 39.0 - 52.0 %   MCV 94.2 80.0 - 100.0 fL   MCH 30.2 26.0 - 34.0 pg   MCHC 32.0 30.0 - 36.0 g/dL   RDW 14.0 11.5 - 15.5 %   Platelets 177 150 - 400 K/uL   nRBC 0.0 0.0 - 0.2 %    Comment: Performed at [facility], [place] [address], [place], [place] [address]  Troponin I (High Sensitivity)     Status: Abnormal   Collection Time: 02/09/2019  1:41 PM  Result Value Ref Range   Troponin I (High Sensitivity) 186 (HH) <18 ng/L    Comment: CRITICAL RESULT CALLED TO, READ BACK BY AND VERIFIED WITH: K.WICKER AT 1603 ON 02/15/2019 BY N.THOMPSON (NOTE) Elevated high sensitivity troponin I (hsTnI) values and significant  changes across serial measurements may suggest ACS but many other  chronic and acute conditions are known to elevate hsTnI results.  Refer to the Links section for chest pain algorithms and additional  guidance. Performed at [facility] [place] [address], [place], [place] [address]   Hepatic function panel     Status: Abnormal   Collection Time: 01/30/2019  1:41 PM  Result Value Ref Range   Total Protein 5.6 (L) 6.5 - 8.1 g/dL   Albumin 3.1 (L) 3.5 - 5.0 g/dL   AST 19 15 - 41 U/L   ALT 14 0 - 44 U/L   Alkaline Phosphatase 48 38 - 126 U/L   Total Bilirubin 1.7 (H) 0.3 - 1.2 mg/dL   Bilirubin, Direct 0.2 0.0 - 0.2 mg/dL   Indirect Bilirubin 1.5 (H) 0.3 - 0.9 mg/dL    Comment: Performed at [facility], [place] [address], [place], [place] [address]  Differential     Status: Abnormal   Collection Time: 02/15/2019  1:41 PM  Result Value Ref Range   Neutrophils Relative % 84 %   Neutro Abs 15.7  (H) 1.7 - 7.7 K/uL   Lymphocytes Relative 8 %   Lymphs Abs 1.4 0.7 - 4.0 K/uL   Monocytes Relative 5 %   Monocytes Absolute 1.0 0.1 - 1.0 K/uL   Eosinophils Relative 2 %   Eosinophils Absolute 0.3 0.0 - 0.5 K/uL   Basophils Relative 0 %   Basophils Absolute 0.1 0.0 - 0.1 K/uL   Immature Granulocytes 1 %  Abs Immature Granulocytes 0.11 (H) 0.00 - 0.07 K/uL    Comment: Performed at [facility], [place] [address], [place], [place] [address]  POC SARS Coronavirus 2 Ag-ED - Nasal Swab (BD Veritor Kit)     Status: None   Collection Time: 02/10/2019  3:30 PM  Result Value Ref Range   SARS Coronavirus 2 Ag NEGATIVE NEGATIVE    Comment: (NOTE) SARS-CoV-2 antigen NOT DETECTED.  Negative results are presumptive.  Negative results do not preclude SARS-CoV-2 infection and should not be used as the sole basis for treatment or other patient management decisions, including infection  control decisions, particularly in the presence of clinical signs and  symptoms consistent with COVID-19, or in those who have been in contact with the virus.  Negative results must be combined with clinical observations, patient history, and epidemiological information. The expected result is Negative. Fact Sheet for Patients: PodPark.tn Fact Sheet for Healthcare Providers: GiftContent.is This test is not yet approved or cleared by the [place] FDA and  has been authorized for detection and/or diagnosis of SARS-CoV-2 by FDA under an Emergency Use Authorization (EUA).  This EUA will remain in effect (meaning this test can be used) for the duration of  the COVID-19 de claration under Section 564(b)(1) of the Act, 21 U.S.C. section 360bbb-3(b)(1), unless the authorization is terminated or revoked sooner.   Urinalysis, Routine w reflex microscopic     Status: Abnormal   Collection Time: 01/25/2019  5:10 PM  Result Value Ref Range   Color,  Urine YELLOW YELLOW   APPearance CLEAR CLEAR   Specific Gravity, Urine 1.018 1.005 - 1.030   pH 5.0 5.0 - 8.0   Glucose, UA 50 (A) NEGATIVE mg/dL   Hgb urine dipstick NEGATIVE NEGATIVE   Bilirubin Urine NEGATIVE NEGATIVE   Ketones, ur NEGATIVE NEGATIVE mg/dL   Protein, ur 30 (A) NEGATIVE mg/dL   Nitrite NEGATIVE NEGATIVE   Leukocytes,Ua NEGATIVE NEGATIVE   RBC / HPF 0-5 0 - 5 RBC/hpf   WBC, UA 0-5 0 - 5 WBC/hpf   Bacteria, UA NONE SEEN NONE SEEN    Comment: Performed at [facility], [place] [address], [place], [place] [address]  Protime-INR     Status: None   Collection Time: 02/04/2019  7:36 PM  Result Value Ref Range   Prothrombin Time 14.5 11.4 - 15.2 seconds   INR 1.1 0.8 - 1.2    Comment: (NOTE) INR goal varies based on device and disease states. Performed at [facility], [place] [address] [place], [place] [address]    CT ABDOMEN PELVIS WO CONTRAST  Result Date: 02/13/2019 CLINICAL DATA:  Left-sided abdominal pain EXAM: CT ABDOMEN AND PELVIS WITHOUT CONTRAST TECHNIQUE: Multidetector CT imaging of the abdomen and pelvis was performed following the standard protocol without IV contrast. COMPARISON:  03/07/2018 FINDINGS: Lower chest: Areas of chronic scarring are again noted in the lung bases bilaterally. Hepatobiliary: Single gallstone is noted within a decompressed gallbladder. The liver is within normal limits. Pancreas: Unremarkable. No pancreatic ductal dilatation or surrounding inflammatory changes. Spleen: Normal in size without focal abnormality. Adrenals/Urinary Tract: Mild adrenal hypertrophy is noted stable from the prior exam. Atrophic left kidney is noted with scattered calcifications. These are stable in appearance from the prior exam. The right kidney appears within normal limits. No obstructive changes are seen. The bladder is well distended. There are multiple calcifications identified in the distal right ureter which appears stable  from the prior exam. These  do not cause obstructive change. Stomach/Bowel: Mild diverticular change of the colon is noted. Some prominent fecal material is noted within the rectum although no definitive impaction is seen. The appendix is not well visualized. No inflammatory changes are noted. The small bowel is within normal limits. Small sliding-type hiatal hernia is again noted. Vascular/Lymphatic: Aortic atherosclerosis. No enlarged abdominal or pelvic lymph nodes. Reproductive: Prostate is unremarkable. Other: No abdominal wall hernia or abnormality. No abdominopelvic ascites. Musculoskeletal: Right hip replacement is noted. There is a left intratrochanteric femoral fracture with mild impaction identified at the fracture site. This extends into the femoral neck. This is new from the prior exam. Multilevel compression deformities are again noted in the lumbar spine stable from the prior study. IMPRESSION: Comminuted fracture of the proximal left femur with impaction at the fracture site. The fracture involves the intratrochanteric region and extends into the level of the femoral neck. This is likely the etiology of the left-sided pain. Diverticulosis without diverticulitis. Distal right ureteral stones without obstructive change. These are stable in appearance from the prior exam. Cholelithiasis without complicating factors. Electronically Signed   By: Inez Catalina M.D.   On: 02/01/2019 16:27   DG Chest 1 View  Result Date: 01/24/2019 CLINICAL DATA:  Increased confusion EXAM: CHEST  1 VIEW COMPARISON:  July 28, 2018. FINDINGS: No new consolidation or edema. No pleural effusion or pneumothorax. Cardiomediastinal contours are stable. IMPRESSION: No acute process in the chest. Electronically Signed   By: Macy Mis M.D.   On: 01/23/2019 15:34   CT Head Wo Contrast  Result Date: 01/23/2019 CLINICAL DATA:  85 year old male with altered mental status. EXAM: CT HEAD WITHOUT CONTRAST TECHNIQUE: Contiguous  axial images were obtained from the base of the skull through the vertex without intravenous contrast. COMPARISON:  Head CT dated 10/24/2018. FINDINGS: Brain: Moderate to advanced age-related atrophy and chronic microvascular ischemic changes. 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 There is no acute intracranial hemorrhage. No mass effect or midline shift. No extra-axial fluid collection. Vascular: No hyperdense vessel or unexpected calcification. Skull: Normal. Negative for fracture or focal lesion. Sinuses/Orbits: No acute finding. Other: None IMPRESSION: 1. No acute intracranial hemorrhage. 2. Age-related atrophy and chronic microvascular ischemic changes. 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 Electronically Signed   By: Anner Crete M.D.   On: 01/25/2019 16:22   Chest Portable 1 View  Result Date: 01/25/2019 CLINICAL DATA:  Preop chest x-ray EXAM: PORTABLE CHEST 1 VIEW COMPARISON:  07/28/2018 FINDINGS: There is mild bilateral interstitial thickening likely chronic. There is elevation of the left diaphragm. There is left basilar airspace disease likely reflecting atelectasis versus less likely pneumonia. There is no pleural effusion or pneumothorax. The heart mediastinum are stable. There is no acute osseous abnormality. IMPRESSION: 1. Left basilar airspace disease likely reflecting atelectasis versus less likely pneumonia. Otherwise, no acute cardiopulmonary disease. Electronically Signed   By: Kathreen Devoid   On: 02/01/2019 18:10    Pending Labs Unresulted Labs (From admission, onward)    Start     Ordered   01/27/2019 0500  CBC  Tomorrow morning,   R     01/28/2019 1715   01/29/2019 5625  Basic metabolic panel  Tomorrow morning,   R     02/03/2019 1715   01/19/2019 1715  Type and screen [place]  Once,   STAT    Comments: [place]    01/23/2019 1715   02/10/2019 1714  VITAMIN D 25 Hydroxy (Vit-D  Deficiency, Fractures)  Once,   STAT      02/15/2019 1715   01/31/2019 1652  SARS CORONAVIRUS 2 (TAT 6-24 HRS) Nasopharyngeal Nasopharyngeal Swab  (Tier 3 (TAT 6-24 hrs))  ONCE - STAT,   STAT    Question Answer Comment  Is this test for diagnosis or screening Screening   Symptomatic for COVID-19 as defined by CDC No   Hospitalized for COVID-19 No   Admitted to ICU for COVID-19 No   Previously tested for COVID-19 Yes   Resident in a congregate (group) care setting No   Employed in healthcare setting No      02/18/2019 1651          Vitals/Pain Today's Vitals   02/13/2019 1400 02/13/2019 1430 02/07/2019 1915 02/06/2019 2008  BP: 127/67 (!) 150/75  (!) 142/75  Pulse: 85 93 82 87  Resp: 16 20 16 17   Temp:      TempSrc:      SpO2: 92% 94% 93% 96%    Isolation Precautions No active isolations  Medications Medications  sodium chloride flush (NS) 0.9 % injection 3 mL (3 mLs Intravenous Not Given 01/30/2019 1340)  amLODipine (NORVASC) tablet 2.5 mg (has no administration in time range)  docusate sodium (COLACE) capsule 100 mg (has no administration in time range)  ferrous sulfate tablet 325 mg (has no administration in time range)  metoprolol tartrate (LOPRESSOR) tablet 25 mg (has no administration in time range)  pantoprazole (PROTONIX) EC tablet 40 mg (has no administration in time range)  HYDROcodone-acetaminophen (NORCO/VICODIN) 5-325 MG per tablet 1-2 tablet (has no administration in time range)  morphine 2 MG/ML injection 2 mg (has no administration in time range)  polyethylene glycol (MIRALAX / GLYCOLAX) packet 17 g (has no administration in time range)  senna (SENOKOT) tablet 8.6 mg (has no administration in time range)  bisacodyl (DULCOLAX) suppository 10 mg (has no administration in time range)  acetaminophen (TYLENOL) tablet 650 mg (has no administration in time range)  dextrose 5 % in lactated ringers infusion (has no administration in time range)  sodium chloride 0.9 % bolus 500 mL (500 mLs Intravenous New Bag/Given  01/23/2019 1454)    Mobility walks with device High fall risk   Focused Assessments Cardiac Assessment Handoff:    Lab Results  Component Value Date   TROPONINI <0.03 08/18/2017   No results found for: DDIMER Does the Patient currently have chest pain? No  , Pulmonary Assessment Handoff:  Lung sounds:   O2 Device: Room Air        R Recommendations: See Admitting Provider Note  Report given to:   Additional Notes:

## 2019-02-16 NOTE — ED Notes (Signed)
Awaiting D5 in lactated ringers from pharmacy

## 2019-02-16 NOTE — ED Provider Notes (Signed)
[facility]-EMERGENCY DEPT Provider Note   CSN: [id number] Arrival date & time: 03/05/2019  1316     History Chief Complaint  Patient presents with  . Altered Mental Status  . Weakness    [name] is a 85 y.o. male with a history of dementia, presenting from [facility] with concerns for confusion and behavioral changes for the past few days, as well as reported weakness and generalized abdominal pain.  Patient apparently is AAO x 2 at baseline, but his caregiver staff expressed concern that he was more confused recently.  Patient self is pleasantly demented on exam and has absolutely no complaints.  He denies any abdominal pain or headache or chest pain.  Update 5:15 pm-  I spoke to the patient's daughter [name] with his wife [name] available alongside, and explained to me that the patient had been doing fairly well independently for the past year or 2, but is subsequently been in cognitive decline for the past 2 weeks.  Report the patient typically ambulates on his own without any difficulties but does have 24-hour assistance at his home.  Over the noted for the past 2 days or so the patient has been refusing to bear weight on his left leg and seems to be having difficulty with his left leg.  They also reported the patient seen much more fatigued than usual and tired.  They denied witnessing any falls.  They confirm that the patient is DNR/DNI.  HPI     Past Medical History:  Diagnosis Date  . Anemia   . Blood dyscrasia    ' free bleeder' per patient   . Coronary artery disease    3 coronary stents- followed by Dr Gwenlyn Saran   . Diabetes mellitus without complication (HCC)    type II   . Essential hypertension 10/22/2014  . GERD (gastroesophageal reflux disease)   . Multiple abrasions    on arms due to stumbling over chair on 11/19/16 per patient     Patient Active Problem List   Diagnosis Date Noted  . Hip fracture due to osteoporosis  (HCC) 03/05/19  . Chest pain 08/18/2017  . Near syncope 08/18/2017  . Leukocytosis 08/18/2017  . Acute metabolic encephalopathy 08/18/2017  . Cellulitis of right leg 08/18/2017  . Blood dyscrasia   . Anemia 07/03/2017  . Fall 07/03/2017  . Falls 07/03/2017  . 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 Dyslipidemia 10/05/2015  . CAD -S/P LAD and CFX DES 10/26/14 10/25/2014  . Abnormal nuclear cardiac imaging test 10/23/2014  . Carotid bruit-Rt-dopplers OK 10/23/2014  . GERD (gastroesophageal reflux disease) 10/23/2014  . Diabetes (HCC) 10/23/2014  . Essential hypertension 10/22/2014  . Chronic renal disease, stage III 10/22/2014  . 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 Anginal pain (HCC) 10/21/2014  . [facility] My[facility] Oct 2013 05/07/2012    Past Surgical History:  Procedure Laterality Date  . CARDIAC CATHETERIZATION N/A 10/25/2014   Procedure: Left Heart Cath and Coronary Angiography;  Surgeon: Kathleene Hazel, MD;  Location: [facility] INVASIVE CV LAB;  Service: Cardiovascular;  Laterality: N/A;  . CARDIAC CATHETERIZATION N/A 10/26/2014   Procedure: Coronary Stent Intervention;  Surgeon: Kathleene Hazel, MD;  Location: [facility] INVASIVE CV LAB;  Service: Cardiovascular;  Laterality: N/A;  . ESOPHAGOGASTRODUODENOSCOPY (EGD) WITH PROPOFOL N/A 11/25/2016   Procedure: ESOPHAGOGASTRODUODENOSCOPY (EGD) WITH PROPOFOL;  Surgeon: Carman Ching, MD;  Location: [facility] ENDOSCOPY;  Service: Endoscopy;  Laterality: N/A;  . HELLER MYOTOMY  12/17/2011  Procedure: LAPAROSCOPIC HELLER MYOTOMY;  Surgeon: Valarie MerinoMatthew B Martin, MD;  Location: [facility] ORS;  Service: General;  Laterality: N/A;  . HERNIA REPAIR  1952  . JOINT REPLACEMENT  2007   rt hip   . SAVORY DILATION N/A 11/25/2016   Procedure: SAVORY DILATION;  Surgeon: Carman ChingEdwards, James, MD;  Location: [facility] ENDOSCOPY;  Service: Endoscopy;  Laterality: N/A;  . UPPER GI ENDOSCOPY  12/17/2011   Procedure: UPPER GI ENDOSCOPY;  Surgeon:  Valarie MerinoMatthew B Martin, MD;  Location: [facility] ORS;  Service: General;  Laterality: N/A;       Family History  Problem Relation Age of Onset  . Heart disease Mother   . Heart disease Father     Social History   Tobacco Use  . Smoking status: Former Games developermoker  . Smokeless tobacco: Never Used  . Tobacco comment: stopped 6251yrs ago  Substance Use Topics  . Alcohol use: No  . Drug use: No    Home Medications Prior to Admission medications   Medication Sig Start Date End Date Taking? Authorizing Provider  acetaminophen (TYLENOL) 325 MG tablet Take 650 mg by mouth every 6 (six) hours as needed for mild pain, moderate pain, fever or headache.    Yes [provider]  amLODipine (NORVASC) 2.5 MG tablet Take 2.5 mg by mouth every evening.   Yes [provider]  docusate sodium (COLACE) 100 MG capsule Take 100 mg by mouth 2 (two) times daily as needed for mild constipation.   Yes [provider]  ferrous sulfate 325 (65 FE) MG tablet Take 325 mg by mouth every Monday, Wednesday, and Friday.    Yes [provider]  metoprolol tartrate (LOPRESSOR) 25 MG tablet Take 25 mg by mouth 2 (two) times daily. 08/11/17  Yes [provider]  multivitamin-lutein (OCUVITE-LUTEIN) CAPS capsule Take 1 capsule by mouth daily.   Yes [provider]  nitroGLYCERIN (NITROSTAT) 0.4 MG SL tablet Place 0.4 mg under the tongue every 5 (five) minutes as needed for chest pain.   Yes [provider]  omeprazole (PRILOSEC) 20 MG capsule Take 20 mg by mouth daily. 08/05/17  Yes [provider]  doxycycline (VIBRAMYCIN) 100 MG capsule Take 1 capsule (100 mg total) by mouth 2 (two) times daily. One po bid x 7 days Patient not taking: Reported on 01/23/2019 07/20/18   Zadie RhineWickline, Donald, MD  furosemide (LASIX) 20 MG tablet Take 1 tablet (20 mg total) by mouth every Monday, Wednesday, and Friday. Patient not taking: Reported on 02/01/2019 08/20/17   Delano MetzSchertz, Robert, MD   lidocaine (LIDODERM) 5 % Place 1 patch onto the skin daily. Remove & Discard patch within 12 hours or as directed by MD Patient not taking: Reported on 08/18/2017 07/05/17   Dimple NanasAmin, Ankit Chirag, MD  metFORMIN (GLUCOPHAGE-XR) 500 MG 24 hr tablet Take 1 tablet (500 mg total) by mouth every morning. Patient not taking: Reported on 03/07/2018 10/29/14   Abelino DerrickKilroy, Luke K, PA-C  polyethylene glycol [facility](MIRALAX) packet Take 17 g by mouth daily as needed for moderate constipation or severe constipation. Patient not taking: Reported on 02/12/2019 07/05/17   Dimple NanasAmin, Ankit Chirag, MD  senna (SENOKOT) 8.6 MG TABS tablet Take 1 tablet (8.6 mg total) by mouth at bedtime as needed for mild constipation. Patient not taking: Reported on 01/28/2019 07/05/17   Dimple NanasAmin, Ankit Chirag, MD    Allergies    Patient has no known allergies.  Review of Systems   Review of Systems  Unable to perform ROS: Dementia (level  5 caveat)    Physical Exam Updated Vital Signs BP (!) 150/75   Pulse 93   Temp 97.6 F (36.4 C) (Oral)   Resp 20   SpO2 94%   Physical Exam Vitals and nursing note reviewed.  Constitutional:      Appearance: He is well-developed.  HENT:     Head: Normocephalic and atraumatic.  Eyes:     Pupils: Pupils are equal, round, and reactive to light.  Cardiovascular:     Rate and Rhythm: Normal rate and regular rhythm.     Pulses: Normal pulses.  Pulmonary:     Effort: Pulmonary effort is normal. No respiratory distress.     Breath sounds: Normal breath sounds.  Abdominal:     General: There is no distension.     Palpations: Abdomen is soft.     Tenderness: There is abdominal tenderness.  Musculoskeletal:     Cervical back: Neck supple.  Skin:    General: Skin is warm and dry.  Neurological:     Mental Status: He is alert.     Comments: Oriented only to person, not to place or time, does not know date of birth or year Can move all extremities No obvious facial asymmetry     ED Results / Procedures  / Treatments   Labs (all labs ordered are listed, but only abnormal results are displayed) Labs Reviewed  BASIC METABOLIC PANEL - Abnormal; Notable for the following components:      Result Value   CO2 21 (*)    Glucose, Bld 147 (*)    BUN 33 (*)    Creatinine, Ser 2.00 (*)    GFR calc non Af Amer 28 (*)    GFR calc Af Amer 32 (*)    All other components within normal limits  CBC - Abnormal; Notable for the following components:   WBC 18.6 (*)    RBC 3.81 (*)    Hemoglobin 11.5 (*)    HCT 35.9 (*)    All other components within normal limits  HEPATIC FUNCTION PANEL - Abnormal; Notable for the following components:   Total Protein 5.6 (*)    Albumin 3.1 (*)    Total Bilirubin 1.7 (*)    Indirect Bilirubin 1.5 (*)    All other components within normal limits  DIFFERENTIAL - Abnormal; Notable for the following components:   Neutro Abs 15.7 (*)    Abs Immature Granulocytes 0.11 (*)    All other components within normal limits  TROPONIN I (HIGH SENSITIVITY) - Abnormal; Notable for the following components:   Troponin I (High Sensitivity) 186 (*)    All other components within normal limits  SARS CORONAVIRUS 2 (TAT 6-24 HRS)  URINALYSIS, ROUTINE W REFLEX MICROSCOPIC  CBC  BASIC METABOLIC PANEL  PROTIME-INR  VITAMIN D 25 HYDROXY (VIT D DEFICIENCY, FRACTURES)  POC SARS CORONAVIRUS 2 AG -  ED  TYPE AND SCREEN  TROPONIN I (HIGH SENSITIVITY)    EKG EKG Interpretation  Date/Time:  Tuesday February 16 2019 16:45:24 EST Ventricular Rate:  86 PR Interval:    QRS Duration: 123 QT Interval:  392 QTC Calculation: 469 R Axis:   59 Text Interpretation: Sinus rhythm Short PR interval Left bundle branch block No STEMI Confirmed by Alvester Chou [phone]) on 02/14/2019 4:50:13 PM   Radiology CT ABDOMEN PELVIS WO CONTRAST  Result Date: 02/09/2019 CLINICAL DATA:  Left-sided abdominal pain EXAM: CT ABDOMEN AND PELVIS WITHOUT CONTRAST TECHNIQUE: Multidetector CT imaging of the  abdomen  and pelvis was performed following the standard protocol without IV contrast. COMPARISON:  03/07/2018 FINDINGS: Lower chest: Areas of chronic scarring are again noted in the lung bases bilaterally. Hepatobiliary: Single gallstone is noted within a decompressed gallbladder. The liver is within normal limits. Pancreas: Unremarkable. No pancreatic ductal dilatation or surrounding inflammatory changes. Spleen: Normal in size without focal abnormality. Adrenals/Urinary Tract: Mild adrenal hypertrophy is noted stable from the prior exam. Atrophic left kidney is noted with scattered calcifications. These are stable in appearance from the prior exam. The right kidney appears within normal limits. No obstructive changes are seen. The bladder is well distended. There are multiple calcifications identified in the distal right ureter which appears stable from the prior exam. These do not cause obstructive change. Stomach/Bowel: Mild diverticular change of the colon is noted. Some prominent fecal material is noted within the rectum although no definitive impaction is seen. The appendix is not well visualized. No inflammatory changes are noted. The small bowel is within normal limits. Small sliding-type hiatal hernia is again noted. Vascular/Lymphatic: Aortic atherosclerosis. No enlarged abdominal or pelvic lymph nodes. Reproductive: Prostate is unremarkable. Other: No abdominal wall hernia or abnormality. No abdominopelvic ascites. Musculoskeletal: Right hip replacement is noted. There is a left intratrochanteric femoral fracture with mild impaction identified at the fracture site. This extends into the femoral neck. This is new from the prior exam. Multilevel compression deformities are again noted in the lumbar spine stable from the prior study. IMPRESSION: Comminuted fracture of the proximal left femur with impaction at the fracture site. The fracture involves the intratrochanteric region and extends into the level of  the femoral neck. This is likely the etiology of the left-sided pain. Diverticulosis without diverticulitis. Distal right ureteral stones without obstructive change. These are stable in appearance from the prior exam. Cholelithiasis without complicating factors. Electronically Signed   By: Inez Catalina M.D.   On: 01/19/2019 16:27   DG Chest 1 View  Result Date: 01/24/2019 CLINICAL DATA:  Increased confusion EXAM: CHEST  1 VIEW COMPARISON:  July 28, 2018. FINDINGS: No new consolidation or edema. No pleural effusion or pneumothorax. Cardiomediastinal contours are stable. IMPRESSION: No acute process in the chest. Electronically Signed   By: Macy Mis M.D.   On: 02/03/2019 15:34   CT Head Wo Contrast  Result Date: 02/05/2019 CLINICAL DATA:  85 year old male with altered mental status. EXAM: CT HEAD WITHOUT CONTRAST TECHNIQUE: Contiguous axial images were obtained from the base of the skull through the vertex without intravenous contrast. COMPARISON:  Head CT dated 10/24/2018. FINDINGS: Brain: Moderate to advanced age-related atrophy and chronic microvascular ischemic changes. 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 There is no acute intracranial hemorrhage. No mass effect or midline shift. No extra-axial fluid collection. Vascular: No hyperdense vessel or unexpected calcification. Skull: Normal. Negative for fracture or focal lesion. Sinuses/Orbits: No acute finding. Other: None IMPRESSION: 1. No acute intracranial hemorrhage. 2. Age-related atrophy and chronic microvascular ischemic changes. 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 Electronically Signed   By: Anner Crete M.D.   On: 02/07/2019 16:22    Procedures Procedures (including critical care time)  Medications Ordered in ED Medications  sodium chloride flush (NS) 0.9 % injection 3 mL (3 mLs Intravenous Not Given 02/10/2019 1340)  amLODipine (NORVASC) tablet 2.5 mg (has no administration in time range)  docusate sodium  (COLACE) capsule 100 mg (has no administration in time range)  ferrous sulfate tablet 325 mg (has no administration in time range)  metoprolol tartrate (LOPRESSOR)  tablet 25 mg (has no administration in time range)  pantoprazole (PROTONIX) EC tablet 40 mg (has no administration in time range)  HYDROcodone-acetaminophen (NORCO/VICODIN) 5-325 MG per tablet 1-2 tablet (has no administration in time range)  morphine 2 MG/ML injection 2 mg (has no administration in time range)  polyethylene glycol (MIRALAX / GLYCOLAX) packet 17 g (has no administration in time range)  senna (SENOKOT) tablet 8.6 mg (has no administration in time range)  bisacodyl (DULCOLAX) suppository 10 mg (has no administration in time range)  sodium chloride 0.9 % bolus 500 mL (500 mLs Intravenous New Bag/Given 02/04/2019 1454)    ED Course  I have reviewed the triage vital signs and the nursing notes.  Pertinent labs & imaging results that were available during my care of the patient were reviewed by me and considered in my medical decision making (see chart for details).   85 year old male with a history of dementia presenting from independent assisted living with concern for increasing weakness and confusion.  Patient appears to have advanced dementia per my exam.  He has no focal complaints but does moan when I palpate his abdomen.  He has no guarding or abdominal distention suggestive of a perforation.  It is reasonable obtain a CT scan of the abdomen pelvis to look for evidence of inflammation such as colitis or diverticulitis, given he does have a leukocytosis.  Also attempt check UA for signs of infection.  We will check a rapid Covid.  Will check ECG and troponin  Vitals stable on arrival.  I do not suspect sepsis based on this initial presentation.  DNR/DNI   Clinical Course as of Feb 16 1719  Tue Feb 16, 2019  1553 Cr 2.0 at baseline   [MT]  1650 Chronic LBBB on ecg, no STEMI, paged cardiology and will admit.   Family updated that we are keeping the patient for admission.   [MT]  1651 Comminuted fracture of the proximal left femur with impaction at the fracture site. The fracture involves the intratrochanteric region and extends into the level of the femoral neck. This is likely the etiology of the left-sided pain.   [MT]  1710 Admitted to hospitalist   [MT]  1717 With no chest pain or SOB or discomfort, I suspect this initial trop is related to some demand ischemia which may be pain related from his hip fracture.  I have not initiated heparin, but am waiting to hear from cardiology.  I've updated the patient's wife [name] and daughter [name] about the hip fracture as well as the cardiac concerns.   [MT]    Clinical Course User Index [MT] Osei Anger, Kermit Balo, MD    Final Clinical Impression(s) / ED Diagnoses Final diagnoses:  Elevated troponin  Closed fracture of left hip, initial encounter [facility])  Weakness    Rx / DC Orders ED Discharge Orders    None       Cedar Roseman, Kermit Balo, MD 02/05/2019 [phone]

## 2019-02-16 NOTE — H&P (Signed)
[facility] History and Physical   Patient: [name]:[id number]   PCP: Lajean Manes, MD DOB: Jan 29, 1923   DOA: Feb 28, 2019   DOS: Feb 28, 2019   DOS: the patient was seen and examined on Feb 28, 2019  Patient coming from: The patient is coming from ALF/ILF  Chief Complaint: confusion  HPI: [name] is a 85 y.o. male with Past medical history of dementia, CAD, type II DM, HTN, GERD. Patient presents with complaints of confusion. Patient was brought into the hospital by caregiver as the caregiver reported that the patient is significantly confused for last few days and not being himself.  At his baseline the patient is alert awake and oriented x2 and pleasantly confused not agitated. Reportedly per EDP's discussion with Family patient has progressive cognitive decline for last 2 weeks.  Over last 2 days patient has been refusing to bear any weight on his left leg he is also more fatigue and tired. No nausea no vomiting.  No fever no chills.  No chest pain abdominal pain. At the time of my evaluation patient was in restraints with bilateral mittens and was trying to pull the mittens off.  Patient was not communicating verbally anything.  Not able to meaningfully follow any commands.  Consistently coughing.  ED Course: Presents with confusion and abdominal pain.  CT abdomen is positive for left-sided femur fracture.  Patient was referred for admission for further work-up and treatment.  At his baseline ambulates without assistance dependent for most of his ADL;  Does not manages his medication on his own.  Review of Systems: as mentioned in the history of present illness.  All other systems reviewed and are negative.  Past Medical History:  Diagnosis Date  . Anemia   . Blood dyscrasia    ' free bleeder' per patient   . Coronary artery disease    3 coronary stents- followed by Dr Loleta Chance   . Diabetes mellitus without complication ([place])    type II   . Essential  hypertension 10/22/2014  . GERD (gastroesophageal reflux disease)   . Multiple abrasions    on arms due to stumbling over chair on 11/19/16 per patient    Past Surgical History:  Procedure Laterality Date  . CARDIAC CATHETERIZATION N/A 10/25/2014   Procedure: Left Heart Cath and Coronary Angiography;  Surgeon: Burnell Blanks, MD;  Location: [facility];  Service: Cardiovascular;  Laterality: N/A;  . CARDIAC CATHETERIZATION N/A 10/26/2014   Procedure: Coronary Stent Intervention;  Surgeon: Burnell Blanks, MD;  Location: [place] [facility];  Service: Cardiovascular;  Laterality: N/A;  . ESOPHAGOGASTRODUODENOSCOPY (EGD) WITH PROPOFOL N/A 11/25/2016   Procedure: ESOPHAGOGASTRODUODENOSCOPY (EGD) WITH PROPOFOL;  Surgeon: Laurence Spates, MD;  Location: [facility];  Service: Endoscopy;  Laterality: N/A;  . HELLER MYOTOMY  12/17/2011   Procedure: LAPAROSCOPIC HELLER MYOTOMY;  Surgeon: Pedro Earls, MD;  Location: [facility];  Service: General;  Laterality: N/A;  . Violet  . JOINT REPLACEMENT  2007   rt hip   . SAVORY DILATION N/A 11/25/2016   Procedure: SAVORY DILATION;  Surgeon: Laurence Spates, MD;  Location: [facility];  Service: Endoscopy;  Laterality: N/A;  . UPPER GI ENDOSCOPY  12/17/2011   Procedure: UPPER GI ENDOSCOPY;  Surgeon: Pedro Earls, MD;  Location: [facility];  Service: General;  Laterality: N/A;   Social History:  reports that he has quit smoking. He has never used smokeless tobacco. He reports that he does not drink alcohol  or use drugs.  No Known Allergies  Family history reviewed and not pertinent Family History  Problem Relation Age of Onset  . Heart disease Mother   . Heart disease Father      Prior to Admission medications   Medication Sig Start Date End Date Taking? Authorizing Provider  acetaminophen (TYLENOL) 325 MG tablet Take 650 mg by mouth every 6 (six) hours as needed for mild pain, moderate pain, fever or headache.    Yes  [provider]  amLODipine (NORVASC) 2.5 MG tablet Take 2.5 mg by mouth every evening.   Yes [provider]  docusate sodium (COLACE) 100 MG capsule Take 100 mg by mouth 2 (two) times daily as needed for mild constipation.   Yes [provider]  ferrous sulfate 325 (65 FE) MG tablet Take 325 mg by mouth every Monday, Wednesday, and Friday.    Yes [provider]  metoprolol tartrate (LOPRESSOR) 25 MG tablet Take 25 mg by mouth 2 (two) times daily. 08/11/17  Yes [provider]  multivitamin-lutein (OCUVITE-LUTEIN) CAPS capsule Take 1 capsule by mouth daily.   Yes [provider]  nitroGLYCERIN (NITROSTAT) 0.4 MG SL tablet Place 0.4 mg under the tongue every 5 (five) minutes as needed for chest pain.   Yes [provider]  omeprazole (PRILOSEC) 20 MG capsule Take 20 mg by mouth daily. 08/05/17  Yes [provider]  doxycycline (VIBRAMYCIN) 100 MG capsule Take 1 capsule (100 mg total) by mouth 2 (two) times daily. One po bid x 7 days Patient not taking: Reported on 01/21/2019 07/20/18   Zadie Rhine, MD  furosemide (LASIX) 20 MG tablet Take 1 tablet (20 mg total) by mouth every Monday, Wednesday, and Friday. Patient not taking: Reported on 01/23/2019 08/20/17   Delano Metz, MD  lidocaine (LIDODERM) 5 % Place 1 patch onto the skin daily. Remove & Discard patch within 12 hours or as directed by MD Patient not taking: Reported on 08/18/2017 07/05/17   Dimple Nanas, MD  metFORMIN (GLUCOPHAGE-XR) 500 MG 24 hr tablet Take 1 tablet (500 mg total) by mouth every morning. Patient not taking: Reported on 03/07/2018 10/29/14   Abelino Derrick, PA-C  polyethylene glycol [facility]) packet Take 17 g by mouth daily as needed for moderate constipation or severe constipation. Patient not taking: Reported on 02/05/2019 07/05/17   Dimple Nanas, MD  senna (SENOKOT) 8.6 MG TABS tablet Take 1 tablet (8.6 mg total) by mouth at bedtime as needed  for mild constipation. Patient not taking: Reported on 02/11/2019 07/05/17   Dimple Nanas, MD    Physical Exam: Vitals:   02/08/2019 1339 01/27/2019 1400 02/06/2019 1430  BP: 128/69 127/67 (!) 150/75  Pulse: 83 85 93  Resp: 19 16 20   Temp: 97.6 F (36.4 C)    TempSrc: Oral    SpO2: 94% 92% 94%    General: alert and not oriented to time, place, and person. Appear in moderate distress, affect anxious Eyes: PERRL, Conjunctiva normal ENT: Oral Mucosa Clear, dry  Neck: no JVD, no Abnormal Mass Or lumps Cardiovascular: S1 and S2 Present, no Murmur, peripheral pulses symmetrical Respiratory: good respiratory effort, Bilateral Air entry equal and Decreased, no signs of accessory muscle use, faint Crackles, no wheezes Abdomen: Bowel Sound present, Soft and no tenderness, no hernia Skin: no rashes  Extremities: no Pedal edema, no calf tenderness Neurologic: without any new focal findings Gait not checked due to patient safety concerns  Data Reviewed: I have  personally reviewed and interpreted labs, imaging as discussed below.  CBC: Recent Labs  Lab 01/28/2019 1341  WBC 18.6*  NEUTROABS 15.7*  HGB 11.5*  HCT 35.9*  MCV 94.2  PLT 177   Basic Metabolic Panel: Recent Labs  Lab 02/11/2019 1341  NA 138  K 4.1  CL 108  CO2 21*  GLUCOSE 147*  BUN 33*  CREATININE 2.00*  CALCIUM 9.0   GFR: CrCl cannot be calculated (Unknown ideal weight.). Liver Function Tests: Recent Labs  Lab 02/18/2019 1341  AST 19  ALT 14  ALKPHOS 48  BILITOT 1.7*  PROT 5.6*  ALBUMIN 3.1*   Urine analysis:    Component Value Date/Time   COLORURINE YELLOW 02/18/2019 1710   APPEARANCEUR CLEAR 01/30/2019 1710   LABSPEC 1.018 01/24/2019 1710   PHURINE 5.0 02/14/2019 1710   GLUCOSEU 50 (A) 01/19/2019 1710   HGBUR NEGATIVE 02/05/2019 1710   BILIRUBINUR NEGATIVE 02/10/2019 1710   KETONESUR NEGATIVE 02/09/2019 1710   PROTEINUR 30 (A) 02/09/2019 1710   NITRITE NEGATIVE 02/09/2019 1710   LEUKOCYTESUR  NEGATIVE 02/15/2019 1710    Radiological Exams on Admission: CT ABDOMEN PELVIS WO CONTRAST  Result Date: 02/15/2019 CLINICAL DATA:  Left-sided abdominal pain EXAM: CT ABDOMEN AND PELVIS WITHOUT CONTRAST TECHNIQUE: Multidetector CT imaging of the abdomen and pelvis was performed following the standard protocol without IV contrast. COMPARISON:  03/07/2018 FINDINGS: Lower chest: Areas of chronic scarring are again noted in the lung bases bilaterally. Hepatobiliary: Single gallstone is noted within a decompressed gallbladder. The liver is within normal limits. Pancreas: Unremarkable. No pancreatic ductal dilatation or surrounding inflammatory changes. Spleen: Normal in size without focal abnormality. Adrenals/Urinary Tract: Mild adrenal hypertrophy is noted stable from the prior exam. Atrophic left kidney is noted with scattered calcifications. These are stable in appearance from the prior exam. The right kidney appears within normal limits. No obstructive changes are seen. The bladder is well distended. There are multiple calcifications identified in the distal right ureter which appears stable from the prior exam. These do not cause obstructive change. Stomach/Bowel: Mild diverticular change of the colon is noted. Some prominent fecal material is noted within the rectum although no definitive impaction is seen. The appendix is not well visualized. No inflammatory changes are noted. The small bowel is within normal limits. Small sliding-type hiatal hernia is again noted. Vascular/Lymphatic: Aortic atherosclerosis. No enlarged abdominal or pelvic lymph nodes. Reproductive: Prostate is unremarkable. Other: No abdominal wall hernia or abnormality. No abdominopelvic ascites. Musculoskeletal: Right hip replacement is noted. There is a left intratrochanteric femoral fracture with mild impaction identified at the fracture site. This extends into the femoral neck. This is new from the prior exam. Multilevel compression  deformities are again noted in the lumbar spine stable from the prior study. IMPRESSION: Comminuted fracture of the proximal left femur with impaction at the fracture site. The fracture involves the intratrochanteric region and extends into the level of the femoral neck. This is likely the etiology of the left-sided pain. Diverticulosis without diverticulitis. Distal right ureteral stones without obstructive change. These are stable in appearance from the prior exam. Cholelithiasis without complicating factors. Electronically Signed   By: Alcide CleverMark  Lukens M.D.   On: 02/08/2019 16:27   DG Chest 1 View  Result Date: 02/14/2019 CLINICAL DATA:  Increased confusion EXAM: CHEST  1 VIEW COMPARISON:  July 28, 2018. FINDINGS: No new consolidation or edema. No pleural effusion or pneumothorax. Cardiomediastinal contours are stable. IMPRESSION: No acute process in the chest. Electronically Signed  By: Guadlupe Spanish M.D.   On: 02/11/2019 15:34   CT Head Wo Contrast  Result Date: 01/24/2019 CLINICAL DATA:  85 year old male with altered mental status. EXAM: CT HEAD WITHOUT CONTRAST TECHNIQUE: Contiguous axial images were obtained from the base of the skull through the vertex without intravenous contrast. COMPARISON:  Head CT dated 10/24/2018. FINDINGS: Brain: Moderate to advanced age-related atrophy and chronic microvascular ischemic changes. 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 There is no acute intracranial hemorrhage. No mass effect or midline shift. No extra-axial fluid collection. Vascular: No hyperdense vessel or unexpected calcification. Skull: Normal. Negative for fracture or focal lesion. Sinuses/Orbits: No acute finding. Other: None IMPRESSION: 1. No acute intracranial hemorrhage. 2. Age-related atrophy and chronic microvascular ischemic changes. 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 Electronically Signed   By: Elgie Collard M.D.   On: 02/03/2019 16:22   Chest Portable 1 View  Result  Date: 01/23/2019 CLINICAL DATA:  Preop chest x-ray EXAM: PORTABLE CHEST 1 VIEW COMPARISON:  07/28/2018 FINDINGS: There is mild bilateral interstitial thickening likely chronic. There is elevation of the left diaphragm. There is left basilar airspace disease likely reflecting atelectasis versus less likely pneumonia. There is no pleural effusion or pneumothorax. The heart mediastinum are stable. There is no acute osseous abnormality. IMPRESSION: 1. Left basilar airspace disease likely reflecting atelectasis versus less likely pneumonia. Otherwise, no acute cardiopulmonary disease. Electronically Signed   By: Elige Ko   On: 02/08/2019 18:10   EKG: Independently reviewed. normal sinus rhythm, nonspecific ST and T waves changes, LBBB.  I reviewed all nursing notes, pharmacy notes, vitals, pertinent old records.  Assessment/Plan 1. Hip fracture due to osteoporosis (HCC) Presents with confusion and abdominal pain. CT abdomen is positive for left femur comminuted fracture with impaction fracture site involving the intertrochanteric region. CT head is unremarkable. Orthopedic is consulted. Perform further syncope work-up.  2.  Unwitnessed fall Suspecting the patient may have suffered from unwitnessed fall. The time of my evaluation patient does not have any other injury or trauma. CT head unremarkable. Spontaneously moving all extremities. Monitor on telemetry and echocardiogram as well.  To rule out cardiac syncope  3.  Dementia with behavioral disturbances. Acute metabolic encephalopathy. Check urinalysis. Continue pain control but cautious approach given patient's ongoing delirium. Monitor for now.  4.  Cough. Confusion. With patient's presentation with cough and confusion and plan to maintain the patient as a PUI until his Covid test is negative.  Subsequent chest x-ray ordered. Also speech therapy consultation to ensure the patient is able to swallow safely.  5.  Essential  hypertension. Monitor continue home blood pressure medication.  6.  Chronic kidney disease stage IIIb Gentle IV hydration for now. Monitor.  Nutrition: NPO  DVT Prophylaxis: SCD, pharmacological prophylaxis contraindicated due to Preop  Advance goals of care discussion: DNR   Consults: EDP discussed with orthopedic as well as cardiology.  Family Communication: no family was present at bedside, at the time of interview.  Disposition: Admitted as inpatient, telemetry unit. Likely to be discharged be determined.  I have discussed plan of care as described above with RN and patient/family.  Author: Lynden Oxford, MD [facility] 01/24/2019 6:12 PM   To reach On-call, see care teams to locate the attending and reach out to them via [email or web address]. If 7PM-7AM, please contact night-coverage If you still have difficulty reaching the attending provider, please page the [facility] (Director on Call) for [facility] on amion for assistance.

## 2019-02-17 ENCOUNTER — Inpatient Hospital Stay (HOSPITAL_COMMUNITY): Payer: Medicare Other

## 2019-02-17 ENCOUNTER — Other Ambulatory Visit: Payer: Self-pay

## 2019-02-17 ENCOUNTER — Inpatient Hospital Stay (HOSPITAL_COMMUNITY): Payer: Medicare Other | Admitting: Certified Registered Nurse Anesthetist

## 2019-02-17 ENCOUNTER — Encounter (HOSPITAL_COMMUNITY): Payer: Self-pay | Admitting: Internal Medicine

## 2019-02-17 ENCOUNTER — Encounter (HOSPITAL_COMMUNITY): Admission: EM | Disposition: E | Payer: Self-pay | Source: Home / Self Care | Attending: Internal Medicine

## 2019-02-17 DIAGNOSIS — I10 Essential (primary) hypertension: Secondary | ICD-10-CM

## 2019-02-17 DIAGNOSIS — N1832 Chronic kidney disease, stage 3b: Secondary | ICD-10-CM

## 2019-02-17 DIAGNOSIS — Z9861 Coronary angioplasty status: Secondary | ICD-10-CM

## 2019-02-17 DIAGNOSIS — M80052A Age-related osteoporosis with current pathological fracture, left femur, initial encounter for fracture: Principal | ICD-10-CM

## 2019-02-17 DIAGNOSIS — E785 Hyperlipidemia, unspecified: Secondary | ICD-10-CM

## 2019-02-17 DIAGNOSIS — I350 Nonrheumatic aortic (valve) stenosis: Secondary | ICD-10-CM

## 2019-02-17 DIAGNOSIS — I340 Nonrheumatic mitral (valve) insufficiency: Secondary | ICD-10-CM

## 2019-02-17 DIAGNOSIS — G9341 Metabolic encephalopathy: Secondary | ICD-10-CM

## 2019-02-17 DIAGNOSIS — I2510 Atherosclerotic heart disease of native coronary artery without angina pectoris: Secondary | ICD-10-CM

## 2019-02-17 DIAGNOSIS — S72002A Fracture of unspecified part of neck of left femur, initial encounter for closed fracture: Secondary | ICD-10-CM

## 2019-02-17 DIAGNOSIS — K219 Gastro-esophageal reflux disease without esophagitis: Secondary | ICD-10-CM

## 2019-02-17 HISTORY — PX: FEMUR IM NAIL: SHX1597

## 2019-02-17 LAB — TYPE AND SCREEN
ABO/RH(D): B POS
ABO/RH(D): B POS
Antibody Screen: NEGATIVE
Antibody Screen: NEGATIVE

## 2019-02-17 LAB — CBC
HCT: 34.7 % — ABNORMAL LOW (ref 39.0–52.0)
Hemoglobin: 10.9 g/dL — ABNORMAL LOW (ref 13.0–17.0)
MCH: 30.4 pg (ref 26.0–34.0)
MCHC: 31.4 g/dL (ref 30.0–36.0)
MCV: 96.9 fL (ref 80.0–100.0)
Platelets: 157 10*3/uL (ref 150–400)
RBC: 3.58 MIL/uL — ABNORMAL LOW (ref 4.22–5.81)
RDW: 14.1 % (ref 11.5–15.5)
WBC: 15.6 10*3/uL — ABNORMAL HIGH (ref 4.0–10.5)
nRBC: 0 % (ref 0.0–0.2)

## 2019-02-17 LAB — BASIC METABOLIC PANEL
Anion gap: 11 (ref 5–15)
BUN: 31 mg/dL — ABNORMAL HIGH (ref 8–23)
CO2: 22 mmol/L (ref 22–32)
Calcium: 9.2 mg/dL (ref 8.9–10.3)
Chloride: 107 mmol/L (ref 98–111)
Creatinine, Ser: 1.89 mg/dL — ABNORMAL HIGH (ref 0.61–1.24)
GFR calc Af Amer: 34 mL/min — ABNORMAL LOW (ref >60)
GFR calc non Af Amer: 30 mL/min — ABNORMAL LOW (ref >60)
Glucose, Bld: 151 mg/dL — ABNORMAL HIGH (ref 70–99)
Potassium: 3.9 mmol/L (ref 3.5–5.1)
Sodium: 140 mmol/L (ref 135–145)

## 2019-02-17 LAB — VITAMIN D 25 HYDROXY (VIT D DEFICIENCY, FRACTURES): Vit D, 25-Hydroxy: 32.45 ng/mL (ref 30–100)

## 2019-02-17 LAB — SARS CORONAVIRUS 2 (TAT 6-24 HRS): SARS Coronavirus 2: NEGATIVE

## 2019-02-17 LAB — SURGICAL PCR SCREEN
MRSA, PCR: NEGATIVE
Staphylococcus aureus: NEGATIVE

## 2019-02-17 LAB — ABO/RH: ABO/RH(D): B POS

## 2019-02-17 LAB — ECHOCARDIOGRAM COMPLETE

## 2019-02-17 LAB — TROPONIN I (HIGH SENSITIVITY): Troponin I (High Sensitivity): 160 ng/L (ref <18)

## 2019-02-17 SURGERY — INSERTION, INTRAMEDULLARY ROD, FEMUR
Anesthesia: General | Laterality: Left

## 2019-02-17 MED ORDER — DOCUSATE SODIUM 100 MG PO CAPS
100.0000 mg | ORAL_CAPSULE | Freq: Two times a day (BID) | ORAL | Status: DC
  Filled 2019-02-17 (×3): qty 1

## 2019-02-17 MED ORDER — DEXAMETHASONE SODIUM PHOSPHATE 10 MG/ML IJ SOLN
INTRAMUSCULAR | Status: DC | PRN
  Administered 2019-02-17: 5 mg via INTRAVENOUS

## 2019-02-17 MED ORDER — ONDANSETRON HCL 4 MG/2ML IJ SOLN
INTRAMUSCULAR | Status: AC
  Filled 2019-02-17: qty 2

## 2019-02-17 MED ORDER — PHENOL 1.4 % MT LIQD
1.0000 | OROMUCOSAL | Status: DC | PRN
  Filled 2019-02-17: qty 177

## 2019-02-17 MED ORDER — LIDOCAINE 2% (20 MG/ML) 5 ML SYRINGE
INTRAMUSCULAR | Status: AC
  Filled 2019-02-17: qty 5

## 2019-02-17 MED ORDER — ALBUMIN HUMAN 5 % IV SOLN
INTRAVENOUS | Status: AC
  Filled 2019-02-17: qty 250

## 2019-02-17 MED ORDER — PHENYLEPHRINE 40 MCG/ML (10ML) SYRINGE FOR IV PUSH (FOR BLOOD PRESSURE SUPPORT)
PREFILLED_SYRINGE | INTRAVENOUS | Status: AC
  Filled 2019-02-17: qty 20

## 2019-02-17 MED ORDER — DEXAMETHASONE SODIUM PHOSPHATE 10 MG/ML IJ SOLN
INTRAMUSCULAR | Status: AC
  Filled 2019-02-17: qty 1

## 2019-02-17 MED ORDER — BUPIVACAINE HCL 0.25 % IJ SOLN
INTRAMUSCULAR | Status: DC | PRN
  Administered 2019-02-17: 20 mL

## 2019-02-17 MED ORDER — EPHEDRINE 5 MG/ML INJ
INTRAVENOUS | Status: AC
  Filled 2019-02-17: qty 10

## 2019-02-17 MED ORDER — LACTATED RINGERS IV SOLN: INTRAVENOUS | Status: DC

## 2019-02-17 MED ORDER — METOCLOPRAMIDE HCL 5 MG PO TABS: 5.0000 mg | ORAL_TABLET | Freq: Three times a day (TID) | ORAL | Status: DC | PRN

## 2019-02-17 MED ORDER — ORAL CARE MOUTH RINSE
15.0000 mL | Freq: Two times a day (BID) | OROMUCOSAL | Status: DC
  Administered 2019-02-17 – 2019-02-20 (×8): 15 mL via OROMUCOSAL

## 2019-02-17 MED ORDER — SUGAMMADEX SODIUM 500 MG/5ML IV SOLN
INTRAVENOUS | Status: DC | PRN
  Administered 2019-02-17: 200 mg via INTRAVENOUS

## 2019-02-17 MED ORDER — FENTANYL CITRATE (PF) 100 MCG/2ML IJ SOLN
INTRAMUSCULAR | Status: DC | PRN
  Administered 2019-02-17 (×2): 50 ug via INTRAVENOUS

## 2019-02-17 MED ORDER — PANTOPRAZOLE SODIUM 40 MG IV SOLR
40.0000 mg | INTRAVENOUS | Status: DC
  Administered 2019-02-18: 10:00:00 40 mg via INTRAVENOUS
  Filled 2019-02-17 (×2): qty 40

## 2019-02-17 MED ORDER — METOPROLOL TARTRATE 5 MG/5ML IV SOLN
5.0000 mg | Freq: Two times a day (BID) | INTRAVENOUS | Status: AC
  Administered 2019-02-17: 5 mg via INTRAVENOUS
  Filled 2019-02-17: qty 5

## 2019-02-17 MED ORDER — FENTANYL CITRATE (PF) 100 MCG/2ML IJ SOLN
INTRAMUSCULAR | Status: AC
  Filled 2019-02-17: qty 2

## 2019-02-17 MED ORDER — CEFAZOLIN SODIUM-DEXTROSE 2-4 GM/100ML-% IV SOLN
2.0000 g | INTRAVENOUS | Status: AC
  Administered 2019-02-17: 2 g via INTRAVENOUS
  Filled 2019-02-17: qty 100

## 2019-02-17 MED ORDER — MORPHINE SULFATE (PF) 2 MG/ML IV SOLN: 1.0000 mg | INTRAVENOUS | Status: DC | PRN

## 2019-02-17 MED ORDER — PHENYLEPHRINE HCL-NACL 10-0.9 MG/250ML-% IV SOLN
INTRAVENOUS | Status: DC | PRN
  Administered 2019-02-17: 40 ug/min via INTRAVENOUS

## 2019-02-17 MED ORDER — ONDANSETRON HCL 4 MG/2ML IJ SOLN: 4.0000 mg | Freq: Four times a day (QID) | INTRAMUSCULAR | Status: DC | PRN

## 2019-02-17 MED ORDER — MENTHOL 3 MG MT LOZG: 1.0000 | LOZENGE | OROMUCOSAL | Status: DC | PRN

## 2019-02-17 MED ORDER — ROCURONIUM BROMIDE 50 MG/5ML IV SOSY
PREFILLED_SYRINGE | INTRAVENOUS | Status: DC | PRN
  Administered 2019-02-17: 20 mg via INTRAVENOUS

## 2019-02-17 MED ORDER — ONDANSETRON HCL 4 MG PO TABS: 4.0000 mg | ORAL_TABLET | Freq: Four times a day (QID) | ORAL | Status: DC | PRN

## 2019-02-17 MED ORDER — ONDANSETRON HCL 4 MG/2ML IJ SOLN
INTRAMUSCULAR | Status: DC | PRN
  Administered 2019-02-17: 4 mg via INTRAVENOUS

## 2019-02-17 MED ORDER — POVIDONE-IODINE 10 % EX SWAB
2.0000 "application " | Freq: Once | CUTANEOUS | Status: AC
  Administered 2019-02-17: 2 via TOPICAL

## 2019-02-17 MED ORDER — SUCCINYLCHOLINE CHLORIDE 200 MG/10ML IV SOSY
PREFILLED_SYRINGE | INTRAVENOUS | Status: DC | PRN
  Administered 2019-02-17: 120 mg via INTRAVENOUS

## 2019-02-17 MED ORDER — METOCLOPRAMIDE HCL 5 MG/ML IJ SOLN: 5.0000 mg | Freq: Three times a day (TID) | INTRAMUSCULAR | Status: DC | PRN

## 2019-02-17 MED ORDER — ROCURONIUM BROMIDE 10 MG/ML (PF) SYRINGE
PREFILLED_SYRINGE | INTRAVENOUS | Status: AC
  Filled 2019-02-17: qty 10

## 2019-02-17 MED ORDER — ALBUMIN HUMAN 5 % IV SOLN: INTRAVENOUS | Status: DC | PRN

## 2019-02-17 MED ORDER — ASPIRIN EC 325 MG PO TBEC
325.0000 mg | DELAYED_RELEASE_TABLET | Freq: Every day | ORAL | Status: DC
  Administered 2019-02-18: 325 mg via ORAL
  Filled 2019-02-17: qty 1

## 2019-02-17 MED ORDER — LIDOCAINE 2% (20 MG/ML) 5 ML SYRINGE
INTRAMUSCULAR | Status: DC | PRN
  Administered 2019-02-17: 60 mg via INTRAVENOUS

## 2019-02-17 MED ORDER — 0.9 % SODIUM CHLORIDE (POUR BTL) OPTIME
TOPICAL | Status: DC | PRN
  Administered 2019-02-17: 1000 mL

## 2019-02-17 MED ORDER — ENSURE PRE-SURGERY PO LIQD
296.0000 mL | Freq: Once | ORAL | Status: DC
  Filled 2019-02-17: qty 296

## 2019-02-17 MED ORDER — PHENYLEPHRINE 40 MCG/ML (10ML) SYRINGE FOR IV PUSH (FOR BLOOD PRESSURE SUPPORT)
PREFILLED_SYRINGE | INTRAVENOUS | Status: DC | PRN
  Administered 2019-02-17 (×2): 120 ug via INTRAVENOUS

## 2019-02-17 MED ORDER — EPHEDRINE SULFATE-NACL 50-0.9 MG/10ML-% IV SOSY
PREFILLED_SYRINGE | INTRAVENOUS | Status: DC | PRN
  Administered 2019-02-17: 10 mg via INTRAVENOUS

## 2019-02-17 MED ORDER — FENTANYL CITRATE (PF) 100 MCG/2ML IJ SOLN
25.0000 ug | INTRAMUSCULAR | Status: DC | PRN
  Administered 2019-02-17 (×2): 25 ug via INTRAVENOUS

## 2019-02-17 MED ORDER — PHENYLEPHRINE 40 MCG/ML (10ML) SYRINGE FOR IV PUSH (FOR BLOOD PRESSURE SUPPORT)
PREFILLED_SYRINGE | INTRAVENOUS | Status: AC
  Filled 2019-02-17: qty 10

## 2019-02-17 MED ORDER — PROPOFOL 10 MG/ML IV BOLUS
INTRAVENOUS | Status: DC | PRN
  Administered 2019-02-17: 150 mg via INTRAVENOUS

## 2019-02-17 MED ORDER — PROPOFOL 10 MG/ML IV BOLUS
INTRAVENOUS | Status: AC
  Filled 2019-02-17: qty 20

## 2019-02-17 MED ORDER — SUGAMMADEX SODIUM 500 MG/5ML IV SOLN
INTRAVENOUS | Status: AC
  Filled 2019-02-17: qty 5

## 2019-02-17 MED ORDER — CHLORHEXIDINE GLUCONATE 4 % EX LIQD
60.0000 mL | Freq: Once | CUTANEOUS | Status: AC
  Administered 2019-02-17: 4 via TOPICAL
  Filled 2019-02-17 (×2): qty 60

## 2019-02-17 MED ORDER — BUPIVACAINE HCL (PF) 0.25 % IJ SOLN
INTRAMUSCULAR | Status: AC
  Filled 2019-02-17: qty 30

## 2019-02-17 SURGICAL SUPPLY — 36 items
APL PRP STRL LF DISP 70% ISPRP (MISCELLANEOUS) ×1
BAG SPEC THK2 15X12 ZIP CLS (MISCELLANEOUS) ×1
BAG ZIPLOCK 12X15 (MISCELLANEOUS) ×3 IMPLANT
BIT DRILL CANN LG 4.3MM (BIT) IMPLANT
BNDG COHESIVE 4X5 TAN STRL (GAUZE/BANDAGES/DRESSINGS) ×3 IMPLANT
CHLORAPREP W/TINT 26 (MISCELLANEOUS) ×3 IMPLANT
COVER BACK TABLE 60X90IN (DRAPES) ×3 IMPLANT
COVER SURGICAL LIGHT HANDLE (MISCELLANEOUS) ×3 IMPLANT
COVER WAND RF STERILE (DRAPES) IMPLANT
DRAPE STERI IOBAN 125X83 (DRAPES) ×3 IMPLANT
DRILL BIT CANN LG 4.3MM (BIT) ×3
DRSG PAD ABDOMINAL 8X10 ST (GAUZE/BANDAGES/DRESSINGS) ×3 IMPLANT
ELECT REM PT RETURN 15FT ADLT (MISCELLANEOUS) ×3 IMPLANT
EVACUATOR 1/8 PVC DRAIN (DRAIN) ×3 IMPLANT
GAUZE SPONGE 4X4 12PLY STRL (GAUZE/BANDAGES/DRESSINGS) ×3 IMPLANT
GAUZE XEROFORM 5X9 LF (GAUZE/BANDAGES/DRESSINGS) ×3 IMPLANT
GLOVE ORTHO TXT STRL SZ7.5 (GLOVE) ×3 IMPLANT
GOWN STRL REUS W/TWL LRG LVL3 (GOWN DISPOSABLE) ×3 IMPLANT
GUIDEPIN 3.2X17.5 THRD DISP (PIN) ×2 IMPLANT
HIP FR NAIL LAG SCREW 10.5X110 (Orthopedic Implant) ×3 IMPLANT
KIT BASIN OR (CUSTOM PROCEDURE TRAY) ×3 IMPLANT
KIT TURNOVER KIT A (KITS) IMPLANT
NAIL HIP FRACT 130D 9X180 (Orthopedic Implant) ×2 IMPLANT
PACK GENERAL/GYN (CUSTOM PROCEDURE TRAY) ×3 IMPLANT
PENCIL SMOKE EVACUATOR (MISCELLANEOUS) IMPLANT
PROTECTOR NERVE ULNAR (MISCELLANEOUS) ×3 IMPLANT
SCREW BONE CORTICAL 5.0X40 (Screw) ×2 IMPLANT
SCREW LAG HIP FR NAIL 10.5X110 (Orthopedic Implant) IMPLANT
STAPLER VISISTAT 35W (STAPLE) ×5 IMPLANT
SUT ETHILON 2 0 PS N (SUTURE) ×6 IMPLANT
SUT VIC AB 1 CT1 27 (SUTURE) ×6
SUT VIC AB 1 CT1 27XBRD ANTBC (SUTURE) ×2 IMPLANT
SUT VIC AB 2-0 CT1 27 (SUTURE) ×6
SUT VIC AB 2-0 CT1 TAPERPNT 27 (SUTURE) ×2 IMPLANT
TAPE CLOTH SURG 4X10 WHT LF (GAUZE/BANDAGES/DRESSINGS) ×2 IMPLANT
TOWEL OR 17X26 10 PK STRL BLUE (TOWEL DISPOSABLE) ×6 IMPLANT

## 2019-02-17 NOTE — Op Note (Signed)
Preop diagnosis: Fall with left closed intertrochanteric hip fracture, displaced.  Postop diagnosis: Same  Procedure: Left femoral trochanteric nail, (affixus short nail 130 degree 9 mm x 180 mm length.  110 mm lag screw 40 mm distal interlock screw.)  Surgeon: Rodell Perna, MD  Anesthesia: General +20 cc Marcaine local without epinephrine  EBL 100 cc.  Procedure: After induction general anesthesia patient was placed on the Hana table well leg holder for the opposite right leg Hana boot was used leg was distracted internally rotated checked in C arm with good reduction prepping with DuraPrep.  The usual area was scrubbed with towels large shower curtain Betadine Steri-Drape application timeout procedure 2 g Ancef.  Incision was made proximal to the trochanter gluteus medius fascia was split a pin was placed directly at the tip of the trochanter where the fracture site was located passed into the femur just by hand overreamed and then the short trunk nail was placed rotated advanced until it was in good position and then pin was drilled up center center in the head overreamed screw placed and then distal interlock done which was a 40 mm.  Final spot pictures were taken showing good position and alignment.  Areas was irrigated.  Fascia proximally closed with #1 Vicryl 2-0 Vicryl subtendinous tissue skin staple closure Marcaine infiltration.  Distal 2 small 1 cm openings were closed with the skin staples.  Patient tolerated the procedure well transferred to care room in stable condition.  Plan will be weightbearing as tolerated.  Generally it will take some time with patient's confusion and dementia before he begins to ambulate once soreness gets out of his hip.  Once patient was intubated through his ET tube some thick white stuff was suctioned out by the CRNA.

## 2019-02-17 NOTE — Progress Notes (Signed)
  Echocardiogram 2D Echocardiogram has been performed.  [name] Mar 13, 2019, 9:18 AM

## 2019-02-17 NOTE — Anesthesia Procedure Notes (Signed)
Procedure Name: Intubation Date/Time: 01/30/2019 5:40 PM Performed by: West Pugh, CRNA Pre-anesthesia Checklist: Patient identified, Emergency Drugs available, Suction available, Patient being monitored and Timeout performed Patient Re-evaluated:Patient Re-evaluated prior to induction Oxygen Delivery Method: Circle system utilized Preoxygenation: Pre-oxygenation with 100% oxygen Induction Type: IV induction Ventilation: Mask ventilation without difficulty Laryngoscope Size: Mac and 3 Tube type: Oral Tube size: 7.5 mm Airway Equipment and Method: Stylet Placement Confirmation: ETT inserted through vocal cords under direct vision,  positive ETCO2,  CO2 detector and breath sounds checked- equal and bilateral Secured at: 21 cm Tube secured with: Tape Dental Injury: Teeth and Oropharynx as per pre-operative assessment

## 2019-02-17 NOTE — Evaluation (Signed)
Clinical/Bedside Swallow Evaluation Patient Details  Name: [name] MRN: [id number] Date of Birth: 04/16/1922  Today's Date: 01/25/2019 Time: SLP Start Time (ACUTE ONLY): 0950 SLP Stop Time (ACUTE ONLY): 1011 SLP Time Calculation (min) (ACUTE ONLY): 21 min  Past Medical History:  Past Medical History:  Diagnosis Date  . Anemia   . Blood dyscrasia    ' free bleeder' per patient   . Coronary artery disease    3 coronary stents- followed by Dr Gwenlyn Saran   . Diabetes mellitus without complication (HCC)    type II   . Essential hypertension 10/22/2014  . GERD (gastroesophageal reflux disease)   . Multiple abrasions    on arms due to stumbling over chair on 11/19/16 per patient    Past Surgical History:  Past Surgical History:  Procedure Laterality Date  . CARDIAC CATHETERIZATION N/A 10/25/2014   Procedure: Left Heart Cath and Coronary Angiography;  Surgeon: Kathleene Hazel, MD;  Location: [facility] INVASIVE [facility] LAB;  Service: Cardiovascular;  Laterality: N/A;  . CARDIAC CATHETERIZATION N/A 10/26/2014   Procedure: Coronary Stent Intervention;  Surgeon: Kathleene Hazel, MD;  Location: [facility] INVASIVE [facility] LAB;  Service: Cardiovascular;  Laterality: N/A;  . ESOPHAGOGASTRODUODENOSCOPY (EGD) WITH PROPOFOL N/A 11/25/2016   Procedure: ESOPHAGOGASTRODUODENOSCOPY (EGD) WITH PROPOFOL;  Surgeon: Carman Ching, MD;  Location: [facility];  Service: Endoscopy;  Laterality: N/A;  . HELLER MYOTOMY  12/17/2011   Procedure: LAPAROSCOPIC HELLER MYOTOMY;  Surgeon: Valarie Merino, MD;  Location: [facility];  Service: General;  Laterality: N/A;  . HERNIA REPAIR  1952  . JOINT REPLACEMENT  2007   rt hip   . SAVORY DILATION N/A 11/25/2016   Procedure: SAVORY DILATION;  Surgeon: Carman Ching, MD;  Location: [facility];  Service: Endoscopy;  Laterality: N/A;  . UPPER GI ENDOSCOPY  12/17/2011   Procedure: UPPER GI ENDOSCOPY;  Surgeon: Valarie Merino, MD;  Location: [facility];  Service: General;  Laterality:  N/A;   HPI:  [name] is a 85 y.o. male with Past medical history of dementia, CAD, type II DM, HTN, GERD. Patient presents with complaints of confusion. Patient was brought into the hospital by caregiver as the caregiver reported that the patient is significantly confused for last few days and not being himself.  No prior documented dysphagia hx.    Assessment / Plan / Recommendation Clinical Impression  Pt presents with oral dysphagia and suspected pharyngeal dysphagia. He was encountered asleep in bed and he remained lethargic throughout this evaluation.  He was observed to have congested sounding respirations upon SLP arrival.  Pt consumed trials of ice chips, thin liquid, nectar-thick liquid, and puree.  He required moderate verbal and tactile cues for labial opening when trials were presented.  He was able to independently draw liquid from the straw and he consistently initiated a swallow with trials, although suspect that it was delayed.  Pt exhibited an immediate cough following all ice chip and liquid trials and he exhibited an immediate cough following 1/3 puree trials.  Pt presented with prolonged AP transport with all trials, but no oral residue was observed following swallow initiation.  Recommend continuation of NPO with essential medications administered crushed in puree if necessary.  Pt is not appropriate for a MBS today secondary to lethargy; however, if LOA improves, pt will likely benefit from a MBS in the near future to further evaluate swallow function and to help determine the safest/least restrictive diet.  SLP will follow up to determine readiness  for clinical diet initiation vs instrumental swallow study per POC.   SLP Visit Diagnosis: Dysphagia, unspecified (R13.10)    Aspiration Risk  Moderate aspiration risk    Diet Recommendation NPO   Medication Administration: Via alternative means(Crushed in puree if necessary )    Other  Recommendations Oral Care  Recommendations: Oral care QID;Staff/trained caregiver to provide oral care   Follow up Recommendations Skilled Nursing facility      Frequency and Duration min 2x/week  2 weeks       Prognosis Prognosis for Safe Diet Advancement: Fair Barriers to Reach Goals: Cognitive deficits      Swallow Study   General Date of Onset: 02/10/2019 HPI: [name] is a 85 y.o. male with Past medical history of dementia, CAD, type II DM, HTN, GERD. Patient presents with complaints of confusion. Patient was brought into the hospital by caregiver as the caregiver reported that the patient is significantly confused for last few days and not being himself.  No prior documented dysphagia hx.  Type of Study: Bedside Swallow Evaluation Previous Swallow Assessment: none documented  Diet Prior to this Study: NPO Temperature Spikes Noted: Yes Respiratory Status: Room air History of Recent Intubation: No Behavior/Cognition: Lethargic/Drowsy;Requires cueing;Confused Oral Cavity Assessment: Within Functional Limits Oral Care Completed by SLP: No Oral Cavity - Dentition: Other (Comment)(Adequate dentition; unable to determine if dentures or natural) Self-Feeding Abilities: Total assist Patient Positioning: Upright in bed Baseline Vocal Quality: Not observed Volitional Cough: Congested;Weak Volitional Swallow: Unable to elicit    Oral/Motor/Sensory Function Overall Oral Motor/Sensory Function: Other (comment)(Unable to evaluate)   Ice Chips Ice chips: Impaired Presentation: Spoon Oral Phase Impairments: Reduced lingual movement/coordination Oral Phase Functional Implications: Prolonged oral transit Pharyngeal Phase Impairments: Cough - Immediate;Suspected delayed Swallow   Thin Liquid Thin Liquid: Impaired Presentation: Spoon;Straw Oral Phase Impairments: Reduced lingual movement/coordination Oral Phase Functional Implications: Prolonged oral transit Pharyngeal  Phase Impairments: Suspected delayed  Swallow;Cough - Immediate    Nectar Thick Nectar Thick Liquid: Impaired Presentation: Straw Oral phase functional implications: Prolonged oral transit Pharyngeal Phase Impairments: Suspected delayed Swallow;Cough - Immediate   Honey Thick Honey Thick Liquid: Not tested   Puree Puree: Impaired Presentation: Spoon Oral Phase Impairments: Reduced lingual movement/coordination Oral Phase Functional Implications: Prolonged oral transit Pharyngeal Phase Impairments: Suspected delayed Swallow;Cough - Immediate   Solid     Solid: Not tested     Colin Mulders M.S., CCC-SLP Acute Rehabilitation Services Office: [phone]  Elvia Collum Adriel Kessen 01/28/2019,10:25 AM

## 2019-02-17 NOTE — [place] FL2 (Signed)
[place] MEDICAID FL2 LEVEL OF CARE SCREENING TOOL     IDENTIFICATION  Patient Name: [name] Birthdate: 1923/02/02 Sex: male Admission Date (Current Location): 2019-02-22  [facility] and [place] Number:  Producer, television/film/video and Address:         Provider Number: [id number]  Attending Physician Name and Address:  [place], Alvira Philips, DO  Relative Name and Phone Number:       Current Level of Care: Hospital Recommended Level of Care: Skilled Nursing Facility Prior Approval Number:    Date Approved/Denied:   PASRR Number:    Discharge Plan: SNF    Current Diagnoses: Patient Active Problem List   Diagnosis Date Noted  . Hip fracture due to osteoporosis (HCC) 02/22/19  . Chest pain 08/18/2017  . Near syncope 08/18/2017  . Leukocytosis 08/18/2017  . Acute metabolic encephalopathy 08/18/2017  . Cellulitis of right leg 08/18/2017  . Blood dyscrasia   . Anemia 07/03/2017  . Fall 07/03/2017  . Falls 07/03/2017  . 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 Dyslipidemia 10/05/2015  . CAD -S/P LAD and CFX DES 10/26/14 10/25/2014  . Abnormal nuclear cardiac imaging test 10/23/2014  . Carotid bruit-Rt-dopplers OK 10/23/2014  . GERD (gastroesophageal reflux disease) 10/23/2014  . Diabetes (HCC) 10/23/2014  . Essential hypertension 10/22/2014  . Chronic renal disease, stage III 10/22/2014  . 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 Anginal pain (HCC) 10/21/2014  . Lap Heller Myotomy Oct 2013 05/07/2012    Orientation RESPIRATION BLADDER Height & Weight     Self, Time, Situation, Place  Normal Continent Weight:   Height:     BEHAVIORAL SYMPTOMS/MOOD NEUROLOGICAL BOWEL NUTRITION STATUS      Continent Diet  AMBULATORY STATUS COMMUNICATION OF NEEDS Skin   Extensive Assist(left sided Fractured hip) Verbally Surgical wounds(left hip)                       Personal Care Assistance Level of Assistance  Bathing, Feeding, Dressing  Bathing Assistance: Limited assistance Feeding assistance: Independent Dressing Assistance: Limited assistance     Functional Limitations Info  Sight, Hearing, Speech Sight Info: Adequate Hearing Info: Adequate Speech Info: Adequate    SPECIAL CARE FACTORS FREQUENCY  PT (By licensed PT), OT (By licensed OT)     PT Frequency: Eval and treat OT Frequency: Eval and treat            Contractures Contractures Info: Not present    Additional Factors Info  Code Status, Allergies Code Status Info: DNR Allergies Info: No Known Allergies           Current Medications (02/15/2019):  This is the current hospital active medication list Current Facility-Administered Medications  Medication Dose Route Frequency Provider Last Rate Last Admin  . acetaminophen (TYLENOL) tablet 650 mg  650 mg Oral Q6H PRN Rolly Salter, MD      . amLODipine (NORVASC) tablet 2.5 mg  2.5 mg Oral QPM Rolly Salter, MD   2.5 mg at 02/22/2019 2345  . bisacodyl (DULCOLAX) suppository 10 mg  10 mg Rectal Daily PRN Rolly Salter, MD      . dextrose 5 % in lactated ringers infusion   Intravenous Continuous Rolly Salter, MD 75 mL/hr at 01/23/2019 1433 New Bag at 01/28/2019 1433  . docusate sodium (COLACE) capsule 100 mg  100 mg Oral BID PRN Rolly Salter, MD      . feeding supplement (ENSURE PRE-SURGERY) liquid 296 mL  296 mL Oral Once Marybelle Killings, MD      . ferrous sulfate tablet 325 mg  325 mg Oral Q M,W,F Lavina Hamman, MD   325 mg at 2019/03/01 1056  . HYDROcodone-acetaminophen (NORCO/VICODIN) 5-325 MG per tablet 1-2 tablet  1-2 tablet Oral Q6H PRN Lavina Hamman, MD      . MEDLINE mouth rinse  15 mL Mouth Rinse BID [place] Eric J, DO   15 mL at 03-01-19 1057  . metoprolol tartrate (LOPRESSOR) tablet 25 mg  25 mg Oral BID Lavina Hamman, MD   25 mg at Mar 01, 2019 1056  . morphine 2 MG/ML injection 2 mg  2 mg Intravenous Q2H PRN Lavina Hamman, MD      . pantoprazole (PROTONIX) EC tablet 40 mg  40 mg  Oral Daily Lavina Hamman, MD   40 mg at 03/01/19 1056  . polyethylene glycol (MIRALAX / GLYCOLAX) packet 17 g  17 g Oral Daily PRN Lavina Hamman, MD      . senna [facility]) tablet 8.6 mg  1 tablet Oral BID Lavina Hamman, MD   8.6 mg at 03/01/2019 1056  . sodium chloride flush (NS) 0.9 % injection 3 mL  3 mL Intravenous Once Trifan, Carola Rhine, MD         Discharge Medications: Please see discharge summary for a list of discharge medications.  Relevant Imaging Results:  Relevant Lab Results:   Additional Information CW#[id number]  Purcell Mouton, RN

## 2019-02-17 NOTE — Consult Note (Addendum)
Reason for Consult: Left hip fracture Referring Physician: Medicine service  [name] is an 85 y.o. male.  HPI: Patient is a poor historian.  Family member present in patient's room.  Patient usually ambulates with assistance.  Family members not certain as to whether or not he had a recent fall.  CT abdomen performed yesterday to evaluate left-sided abdominal pain and this showed comminuted fracture of the proximal left femur with impaction at the fracture site.  Fracture involves the intratrochanteric region and extends into the level of the femoral neck.  Dr. Lorin Mercy was contacted and he reviewed the studies.  Past Medical History:  Diagnosis Date  . Anemia   . Blood dyscrasia    ' free bleeder' per patient   . Coronary artery disease    3 coronary stents- followed by Dr Loleta Chance   . Diabetes mellitus without complication ([place])    type II   . Essential hypertension 10/22/2014  . GERD (gastroesophageal reflux disease)   . Multiple abrasions    on arms due to stumbling over chair on 11/19/16 per patient     Past Surgical History:  Procedure Laterality Date  . CARDIAC CATHETERIZATION N/A 10/25/2014   Procedure: Left Heart Cath and Coronary Angiography;  Surgeon: Burnell Blanks, MD;  Location: [place] [facility] [facility];  Service: Cardiovascular;  Laterality: N/A;  . CARDIAC CATHETERIZATION N/A 10/26/2014   Procedure: Coronary Stent Intervention;  Surgeon: Burnell Blanks, MD;  Location: [place] [facility];  Service: Cardiovascular;  Laterality: N/A;  . ESOPHAGOGASTRODUODENOSCOPY (EGD) WITH PROPOFOL N/A 11/25/2016   Procedure: ESOPHAGOGASTRODUODENOSCOPY (EGD) WITH PROPOFOL;  Surgeon: Laurence Spates, MD;  Location: [facility];  Service: Endoscopy;  Laterality: N/A;  . HELLER MYOTOMY  12/17/2011   Procedure: LAPAROSCOPIC HELLER MYOTOMY;  Surgeon: Pedro Earls, MD;  Location: [facility];  Service: General;  Laterality: N/A;  . [place]  . JOINT REPLACEMENT  2007   rt hip    . SAVORY DILATION N/A 11/25/2016   Procedure: SAVORY DILATION;  Surgeon: Laurence Spates, MD;  Location[facility];  Service: Endoscopy;  Laterality: N/A;  . UPPER GI ENDOSCOPY  12/17/2011   Procedure: UPPER GI ENDOSCOPY;  Surgeon: Pedro Earls, MD;  Location: [facility];  Service: General;  Laterality: N/A;    Family History  Problem Relation Age of Onset  . Heart disease Mother   . Heart disease Father     Social History:  reports that he has quit smoking. He has never used smokeless tobacco. He reports that he does not drink alcohol or use drugs.  Allergies: No Known Allergies  Medications: I have reviewed the patient's current medications.  Results for orders placed or performed during the hospital encounter of 02/09/2019 (from the past 48 hour(s))  Basic metabolic panel     Status: Abnormal   Collection Time: 02/15/2019  1:41 PM  Result Value Ref Range   Sodium 138 135 - 145 mmol/L   Potassium 4.1 3.5 - 5.1 mmol/L   Chloride 108 98 - 111 mmol/L   CO2 21 (L) 22 - 32 mmol/L   Glucose, Bld 147 (H) 70 - 99 mg/dL   BUN 33 (H) 8 - 23 mg/dL   Creatinine, Ser 2.00 (H) 0.61 - 1.24 mg/dL   Calcium 9.0 8.9 - 10.3 mg/dL   GFR calc non Af Amer 28 (L) >60 mL/min   GFR calc Af Amer 32 (L) >60 mL/min   Anion gap 9 5 - 15  Comment: Performed at [facility], [place] [address], [place], [place] [address]  CBC     Status: Abnormal   Collection Time: 01/24/2019  1:41 PM  Result Value Ref Range   WBC 18.6 (H) 4.0 - 10.5 K/uL   RBC 3.81 (L) 4.22 - 5.81 MIL/uL   Hemoglobin 11.5 (L) 13.0 - 17.0 g/dL   HCT 35.9 (L) 39.0 - 52.0 %   MCV 94.2 80.0 - 100.0 fL   MCH 30.2 26.0 - 34.0 pg   MCHC 32.0 30.0 - 36.0 g/dL   RDW 14.0 11.5 - 15.5 %   Platelets 177 150 - 400 K/uL   nRBC 0.0 0.0 - 0.2 %    Comment: Performed at [facility], [place] [address], [place], [place] [address]  Troponin I (High Sensitivity)     Status: Abnormal   Collection Time: 01/25/2019  1:41 PM   Result Value Ref Range   Troponin I (High Sensitivity) 186 (HH) <18 ng/L    Comment: CRITICAL RESULT CALLED TO, READ BACK BY AND VERIFIED WITH: K.WICKER AT 1603 ON 01/24/2019 BY N.THOMPSON (NOTE) Elevated high sensitivity troponin I (hsTnI) values and significant  changes across serial measurements may suggest ACS but many other  chronic and acute conditions are known to elevate hsTnI results.  Refer to the Links section for chest pain algorithms and additional  guidance. Performed at [facility], [place] [address], [place], [place] [address]   Hepatic function panel     Status: Abnormal   Collection Time: 02/06/2019  1:41 PM  Result Value Ref Range   Total Protein 5.6 (L) 6.5 - 8.1 g/dL   Albumin 3.1 (L) 3.5 - 5.0 g/dL   AST 19 15 - 41 U/L   ALT 14 0 - 44 U/L   Alkaline Phosphatase 48 38 - 126 U/L   Total Bilirubin 1.7 (H) 0.3 - 1.2 mg/dL   Bilirubin, Direct 0.2 0.0 - 0.2 mg/dL   Indirect Bilirubin 1.5 (H) 0.3 - 0.9 mg/dL    Comment: Performed at [facility], [place] [address], [place], [place] [address]  Differential     Status: Abnormal   Collection Time: 02/06/2019  1:41 PM  Result Value Ref Range   Neutrophils Relative % 84 %   Neutro Abs 15.7 (H) 1.7 - 7.7 K/uL   Lymphocytes Relative 8 %   Lymphs Abs 1.4 0.7 - 4.0 K/uL   Monocytes Relative 5 %   Monocytes Absolute 1.0 0.1 - 1.0 K/uL   Eosinophils Relative 2 %   Eosinophils Absolute 0.3 0.0 - 0.5 K/uL   Basophils Relative 0 %   Basophils Absolute 0.1 0.0 - 0.1 K/uL   Immature Granulocytes 1 %   Abs Immature Granulocytes 0.11 (H) 0.00 - 0.07 K/uL    Comment: Performed at [facility], [place] [address] [place],  [address]  POC SARS Coronavirus 2 Ag-ED - Nasal Swab (BD Veritor Kit)     Status: None   Collection Time: 02/04/2019  3:30 PM  Result Value Ref Range   SARS Coronavirus 2 Ag NEGATIVE NEGATIVE    Comment: (NOTE) SARS-CoV-2 antigen NOT DETECTED.  Negative  results are presumptive.  Negative results do not preclude SARS-CoV-2 infection and should not be used as the sole basis for treatment or other patient management decisions, including infection  control decisions, particularly in the presence of clinical signs and  symptoms consistent with COVID-19, or in those who have been in  contact with the virus.  Negative results must be combined with clinical observations, patient history, and epidemiological information. The expected result is Negative. Fact Sheet for Patients: PodPark.tn Fact Sheet for Healthcare Providers: GiftContent.is This test is not yet approved or cleared by the [place] FDA and  has been authorized for detection and/or diagnosis of SARS-CoV-2 by FDA under an Emergency Use Authorization (EUA).  This EUA will remain in effect (meaning this test can be used) for the duration of  the COVID-19 de claration under Section 564(b)(1) of the Act, 21 U.S.C. section 360bbb-3(b)(1), unless the authorization is terminated or revoked sooner.   SARS CORONAVIRUS 2 (TAT 6-24 HRS) Nasopharyngeal Nasopharyngeal Swab     Status: None   Collection Time: 02/02/2019  4:52 PM   Specimen: Nasopharyngeal Swab  Result Value Ref Range   SARS Coronavirus 2 NEGATIVE NEGATIVE    Comment: (NOTE) SARS-CoV-2 target nucleic acids are NOT DETECTED. The SARS-CoV-2 RNA is generally detectable in upper and lower respiratory specimens during the acute phase of infection. Negative results do not preclude SARS-CoV-2 infection, do not rule out co-infections with other pathogens, and should not be used as the sole basis for treatment or other patient management decisions. Negative results must be combined with clinical observations, patient history, and epidemiological information. The expected result is Negative. Fact Sheet for Patients: SugarRoll.be Fact Sheet for  Healthcare Providers: https://www.woods-mathews.com/ This test is not yet approved or cleared by the [place] FDA and  has been authorized for detection and/or diagnosis of SARS-CoV-2 by FDA under an Emergency Use Authorization (EUA). This EUA will remain  in effect (meaning this test can be used) for the duration of the COVID-19 declaration under Section 56 4(b)(1) of the Act, 21 U.S.C. section 360bbb-3(b)(1), unless the authorization is terminated or revoked sooner. Performed at [facility], [place] [address], [place], [place] [address]   Urinalysis, Routine w reflex microscopic     Status: Abnormal   Collection Time: 02/09/2019  5:10 PM  Result Value Ref Range   Color, Urine YELLOW YELLOW   APPearance CLEAR CLEAR   Specific Gravity, Urine 1.018 1.005 - 1.030   pH 5.0 5.0 - 8.0   Glucose, UA 50 (A) NEGATIVE mg/dL   Hgb urine dipstick NEGATIVE NEGATIVE   Bilirubin Urine NEGATIVE NEGATIVE   Ketones, ur NEGATIVE NEGATIVE mg/dL   Protein, ur 30 (A) NEGATIVE mg/dL   Nitrite NEGATIVE NEGATIVE   Leukocytes,Ua NEGATIVE NEGATIVE   RBC / HPF 0-5 0 - 5 RBC/hpf   WBC, UA 0-5 0 - 5 WBC/hpf   Bacteria, UA NONE SEEN NONE SEEN    Comment: Performed at [facility], [place] [address], [place], [place] [address]  Troponin I (High Sensitivity)     Status: Abnormal   Collection Time: 01/24/2019  7:36 PM  Result Value Ref Range   Troponin I (High Sensitivity) 213 (HH) <18 ng/L    Comment: DELTA CHECK NOTED CRITICAL RESULT CALLED TO, READ BACK BY AND VERIFIED WITH: FRAZIER,A @ 2236 ON [id number] BY POTEAT,S (NOTE) Elevated high sensitivity troponin I (hsTnI) values and significant  changes across serial measurements may suggest ACS but many other  chronic and acute conditions are known to elevate hsTnI results.  Refer to the Links section for chest pain algorithms and additional  guidance. Performed at [facility], [place] [address] [place], [place] [address]   Protime-INR     Status: None   Collection Time: 01/25/2019  7:36  PM  Result Value Ref Range   Prothrombin Time 14.5 11.4 - 15.2 seconds   INR 1.1 0.8 - 1.2    Comment: (NOTE) INR goal varies based on device and disease states. Performed at [facility], [place] [address], [place], [place] [address]   VITAMIN D 25 Hydroxy (Vit-D Deficiency, Fractures)     Status: None   Collection Time: 02/08/2019  7:36 PM  Result Value Ref Range   Vit D, 25-Hydroxy 32.45 30 - 100 ng/mL    Comment: (NOTE) Vitamin D deficiency has been defined by the [place] practice guideline as a level of serum 25-OH  vitamin D less than 20 ng/mL (1,2). The Endocrine Society went on to  further define vitamin D insufficiency as a level between 21 and 29  ng/mL (2). 1. IOM (Institute of Medicine). 2010. Dietary reference intakes for  calcium and D. [place]: The [employer]. 2. Holick MF, Binkley Steen, Bischoff-Ferrari HA, et al. Evaluation,  treatment, and prevention of vitamin D deficiency: an Endocrine  Society clinical practice guideline, JCEM. 2011 Jul; 96(7): 1911-30. Performed at [facility], [place] [address] [place], [place] [address]   Type and screen [place]     Status: None   Collection Time: 02/15/2019  7:36 PM  Result Value Ref Range   ABO/RH(D) B POS    Antibody Screen NEG    Sample Expiration      02/19/2019,2359 Performed at [facility], [place] [address] [place], [place] [address]   ABO/Rh     Status: None   Collection Time: 01/26/2019  7:36 PM  Result Value Ref Range   ABO/RH(D)      B POS Performed at [facility], [place] [address], [place], [place] [address]   CBC     Status: Abnormal   Collection Time: 02/08/2019  3:27 AM  Result Value Ref Range   WBC 15.6 (H) 4.0 - 10.5 K/uL   RBC 3.58 (L) 4.22 - 5.81 MIL/uL   Hemoglobin 10.9  (L) 13.0 - 17.0 g/dL   HCT 34.7 (L) 39.0 - 52.0 %   MCV 96.9 80.0 - 100.0 fL   MCH 30.4 26.0 - 34.0 pg   MCHC 31.4 30.0 - 36.0 g/dL   RDW 14.1 11.5 - 15.5 %   Platelets 157 150 - 400 K/uL   nRBC 0.0 0.0 - 0.2 %    Comment: Performed at [facility], [place] [address], [place],  [address]  Basic metabolic panel     Status: Abnormal   Collection Time: 02/02/2019  3:27 AM  Result Value Ref Range   Sodium 140 135 - 145 mmol/L   Potassium 3.9 3.5 - 5.1 mmol/L   Chloride 107 98 - 111 mmol/L   CO2 22 22 - 32 mmol/L   Glucose, Bld 151 (H) 70 - 99 mg/dL   BUN 31 (H) 8 - 23 mg/dL   Creatinine, Ser 1.89 (H) 0.61 - 1.24 mg/dL   Calcium 9.2 8.9 - 10.3 mg/dL   GFR calc non Af Amer 30 (L) >60 mL/min   GFR calc Af Amer 34 (L) >60 mL/min   Anion gap 11 5 - 15    Comment: Performed at [facility], [place] [address] [place], [place] [address]  Troponin I (High Sensitivity)     Status: Abnormal   Collection Time: 01/28/2019 12:37 PM  Result Value  Ref Range   Troponin I (High Sensitivity) 160 (HH) <18 ng/L    Comment: CRITICAL RESULT CALLED TO, READ BACK BY AND VERIFIED WITH: BULLINS, M. @ 1329 02/13/2019 PERRY, J. (NOTE) Elevated high sensitivity troponin I (hsTnI) values and significant  changes across serial measurements may suggest ACS but many other  chronic and acute conditions are known to elevate hsTnI results.  Refer to the Links section for chest pain algorithms and additional  guidance. Performed at [facility], [place] [address], [place], [place] [address]   Surgical pcr screen     Status: None   Collection Time: 02/10/2019  1:05 PM   Specimen: Nasal Mucosa; Nasal Swab  Result Value Ref Range   MRSA, PCR NEGATIVE NEGATIVE   Staphylococcus aureus NEGATIVE NEGATIVE    Comment: (NOTE) The Xpert SA Assay (FDA approved for NASAL specimens in patients 21 years of age and older), is one component of a comprehensive surveillance  program. It is not intended to diagnose infection nor to guide or monitor treatment. Performed at [facility], [place] [address] [place], [place] [address]     CT ABDOMEN PELVIS WO CONTRAST  Result Date: 02/18/2019 CLINICAL DATA:  Left-sided abdominal pain EXAM: CT ABDOMEN AND PELVIS WITHOUT CONTRAST TECHNIQUE: Multidetector CT imaging of the abdomen and pelvis was performed following the standard protocol without IV contrast. COMPARISON:  03/07/2018 FINDINGS: Lower chest: Areas of chronic scarring are again noted in the lung bases bilaterally. Hepatobiliary: Single gallstone is noted within a decompressed gallbladder. The liver is within normal limits. Pancreas: Unremarkable. No pancreatic ductal dilatation or surrounding inflammatory changes. Spleen: Normal in size without focal abnormality. Adrenals/Urinary Tract: Mild adrenal hypertrophy is noted stable from the prior exam. Atrophic left kidney is noted with scattered calcifications. These are stable in appearance from the prior exam. The right kidney appears within normal limits. No obstructive changes are seen. The bladder is well distended. There are multiple calcifications identified in the distal right ureter which appears stable from the prior exam. These do not cause obstructive change. Stomach/Bowel: Mild diverticular change of the colon is noted. Some prominent fecal material is noted within the rectum although no definitive impaction is seen. The appendix is not well visualized. No inflammatory changes are noted. The small bowel is within normal limits. Small sliding-type hiatal hernia is again noted. Vascular/Lymphatic: Aortic atherosclerosis. No enlarged abdominal or pelvic lymph nodes. Reproductive: Prostate is unremarkable. Other: No abdominal wall hernia or abnormality. No abdominopelvic ascites. Musculoskeletal: Right hip replacement is noted. There is a left intratrochanteric femoral fracture with mild impaction  identified at the fracture site. This extends into the femoral neck. This is new from the prior exam. Multilevel compression deformities are again noted in the lumbar spine stable from the prior study. IMPRESSION: Comminuted fracture of the proximal left femur with impaction at the fracture site. The fracture involves the intratrochanteric region and extends into the level of the femoral neck. This is likely the etiology of the left-sided pain. Diverticulosis without diverticulitis. Distal right ureteral stones without obstructive change. These are stable in appearance from the prior exam. Cholelithiasis without complicating factors. Electronically Signed   By: Inez Catalina M.D.   On: 02/03/2019 16:27   DG Chest 1 View  Result Date: 01/25/2019 CLINICAL DATA:  Increased confusion EXAM: CHEST  1 VIEW COMPARISON:  July 28, 2018. FINDINGS: No new consolidation or edema. No pleural effusion or pneumothorax. Cardiomediastinal contours are stable. IMPRESSION: No acute process in the chest.  Electronically Signed   By: Macy Mis M.D.   On: 02/05/2019 15:34   CT Head Wo Contrast  Result Date: 02/01/2019 CLINICAL DATA:  85 year old male with altered mental status. EXAM: CT HEAD WITHOUT CONTRAST TECHNIQUE: Contiguous axial images were obtained from the base of the skull through the vertex without intravenous contrast. COMPARISON:  Head CT dated 10/24/2018. FINDINGS: Brain: Moderate to advanced age-related atrophy and chronic microvascular ischemic changes. 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 There is no acute intracranial hemorrhage. No mass effect or midline shift. No extra-axial fluid collection. Vascular: No hyperdense vessel or unexpected calcification. Skull: Normal. Negative for fracture or focal lesion. Sinuses/Orbits: No acute finding. Other: None IMPRESSION: 1. No acute intracranial hemorrhage. 2. Age-related atrophy and chronic microvascular ischemic changes. 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 Electronically Signed   By: Anner Crete M.D.   On: 01/24/2019 16:22   Chest Portable 1 View  Result Date: 02/07/2019 CLINICAL DATA:  Preop chest x-ray EXAM: PORTABLE CHEST 1 VIEW COMPARISON:  07/28/2018 FINDINGS: There is mild bilateral interstitial thickening likely chronic. There is elevation of the left diaphragm. There is left basilar airspace disease likely reflecting atelectasis versus less likely pneumonia. There is no pleural effusion or pneumothorax. The heart mediastinum are stable. There is no acute osseous abnormality. IMPRESSION: 1. Left basilar airspace disease likely reflecting atelectasis versus less likely pneumonia. Otherwise, no acute cardiopulmonary disease. Electronically Signed   By: Kathreen Devoid   On: 02/09/2019 18:10   ECHOCARDIOGRAM COMPLETE  Result Date: 01/22/2019   ECHOCARDIOGRAM REPORT   Patient Name:   [name] Date of Exam: 01/19/2019 Medical Rec #:  [id number]      Height: Accession #:    [id number]     Weight: Date of Birth:  06/17/1922      BSA: Patient Age:    19 years       BP:           130/75 mmHg Patient Gender: M              HR:           79 bpm. Exam Location:  Inpatient Procedure: 2D Echo, Cardiac Doppler and Color Doppler Indications:    Z01.818 Encounter for other preprocedural examination; R55                 Syncope  History:        Patient has prior history of Echocardiogram examinations, most                 recent 08/18/2017. CAD, Signs/Symptoms:Altered Mental Status and                 Alzheimer's; Risk Factors:Diabetes, Hypertension and                 Dyslipidemia.  Sonographer:    Roseanna Rainbow RDCS Referring Phys: [id number] [place]  Sonographer Comments: Technically difficult study due to poor echo windows. Image acquisition challenging due to respiratory motion. Patient could not follow directions or turn. Patient was moaning, coughing, and audible lung craclkles made the study difficult. IMPRESSIONS  1. Left ventricular  ejection fraction, by visual estimation, is 50 to 55%. The left ventricle has low normal function. There is mildly increased left ventricular hypertrophy.  2. Abnormal septal motion consistent with left bundle branch block.  3. Left ventricular diastolic parameters are consistent with Grade I diastolic dysfunction (impaired relaxation).  4. The left ventricle has no regional wall  motion abnormalities.  5. Global right ventricle has normal systolic function.The right ventricular size is normal. No increase in right ventricular wall thickness.  6. Left atrial size was normal.  7. Right atrial size was normal.  8. Presence of pericardial fat pad.  9. Trivial pericardial effusion is present. 10. Mild mitral annular calcification. 11. The mitral valve is degenerative. Mild mitral valve regurgitation. 12. The tricuspid valve is grossly normal. 13. The aortic valve is tricuspid. Aortic valve regurgitation is not visualized. Mild aortic valve stenosis. 14. There is severe calcifcation of the aortic valve. 15. There is severe thickening of the aortic valve. 16. The pulmonic valve was grossly normal. Pulmonic valve regurgitation is not visualized. 17. Mildly elevated pulmonary artery systolic pressure. 18. The tricuspid regurgitant velocity is 3.02 m/s, and with an assumed right atrial pressure of 8 mmHg, the estimated right ventricular systolic pressure is mildly elevated at 44.5 mmHg. 19. The inferior vena cava is dilated in size with >50% respiratory variability, suggesting right atrial pressure of 8 mmHg. 20. A prior study was performed on 08/18/2017. 21. No significant change from prior study. FINDINGS  Left Ventricle: Left ventricular ejection fraction, by visual estimation, is 50 to 55%. The left ventricle has low normal function. The left ventricle has no regional wall motion abnormalities. The left ventricular internal cavity size was the left ventricle is normal in size. There is mildly increased left ventricular  hypertrophy. Abnormal (paradoxical) septal motion, consistent with left bundle branch block. Left ventricular diastolic parameters are consistent with Grade I diastolic dysfunction (impaired relaxation). Normal left atrial pressure. Right Ventricle: The right ventricular size is normal. No increase in right ventricular wall thickness. Global RV systolic function is has normal systolic function. The tricuspid regurgitant velocity is 3.02 m/s, and with an assumed right atrial pressure  of 8 mmHg, the estimated right ventricular systolic pressure is mildly elevated at 44.5 mmHg. Left Atrium: Left atrial size was normal in size. Right Atrium: Right atrial size was normal in size Pericardium: Trivial pericardial effusion is present. Presence of pericardial fat pad. Mitral Valve: The mitral valve is degenerative in appearance. Mild mitral annular calcification. Mild mitral valve regurgitation. Tricuspid Valve: The tricuspid valve is grossly normal. Tricuspid valve regurgitation is trivial. Aortic Valve: The aortic valve is tricuspid. . There is severe thickening and severe calcifcation of the aortic valve. Aortic valve regurgitation is not visualized. Mild aortic stenosis is present. There is severe thickening of the aortic valve. There is  severe calcifcation of the aortic valve. Aortic valve mean gradient measures 5.4 mmHg. Aortic valve peak gradient measures 10.5 mmHg. Aortic valve area, by VTI measures 1.38 cm. Pulmonic Valve: The pulmonic valve was grossly normal. Pulmonic valve regurgitation is not visualized. Pulmonic regurgitation is not visualized. Aorta: The aortic root and ascending aorta are structurally normal, with no evidence of dilitation. Venous: The inferior vena cava is dilated in size with greater than 50% respiratory variability, suggesting right atrial pressure of 8 mmHg. IAS/Shunts: No atrial level shunt detected by color flow Doppler. Additional Comments: A prior study was performed on 08/18/2017.   LEFT VENTRICLE PLAX 2D LVIDd:         3.50 cm LVIDs:         2.70 cm LV PW:         1.10 cm LV IVS:        1.30 cm LVOT diam:     1.80 cm LV SV:         24 ml  LVOT Area:     2.54 cm  RIGHT VENTRICLE RV S prime:     10.00 cm/s LEFT ATRIUM LA Vol (A2C): 26.7 ml LA Vol (A4C): 21.2 ml  AORTIC VALVE AV Area (Vmax):    1.73 cm AV Area (Vmean):   1.69 cm AV Area (VTI):     1.38 cm AV Vmax:           162.34 cm/s AV Vmean:          107.278 cm/s AV VTI:            0.291 m AV Peak Grad:      10.5 mmHg AV Mean Grad:      5.4 mmHg LVOT Vmax:         110.18 cm/s LVOT Vmean:        71.284 cm/s LVOT VTI:          0.158 m LVOT/AV VTI ratio: 0.54  AORTA Ao Root diam: 3.50 cm Ao Asc diam:  3.00 cm MV E velocity: 74.00 cm/s  103 cm/s  TRICUSPID VALVE MV A velocity: 130.00 cm/s 70.3 cm/s TR Peak grad:   36.5 mmHg MV E/A ratio:  0.57        1.5       TR Vmax:        301.88 cm/s                                       SHUNTS                                      Systemic VTI:  0.16 m                                      Systemic Diam: 1.80 cm  Eleonore Chiquito MD Electronically signed by Eleonore Chiquito MD Signature Date/Time: 01/23/2019/10:45:42 AM    Final     Review of Systems Blood pressure 138/76, pulse 69, temperature 99.2 F (37.3 C), temperature source Oral, resp. rate 20, SpO2 95 %. Physical Exam  Constitutional: No distress.  Patient resting comfortably.  Family member present in room.  Respiratory: No respiratory distress.  Musculoskeletal:     Comments: Patient has some shortening of the left lower extremity.  Pedal pulses intact.    Assessment/Plan: Left hip fracture.  Dr. Lorin Mercy will take patient to the OR this evening for ORIF.  Surgical procedure discussed with family member present.  All questions answered.  Benjiman Core 02/08/2019, 3:46 PM   Patient has multiple medical problems including some kidney disease cardiac disease high risk primarily since patient is unable to give any history   cardiology  obtained preoperative echo which is reviewed satisfactory cardiac input.  EKG and troponin show no evidence of myocardial infarction.  Plan procedure discussed with the patient's wife risk surgery discussed.  Concern if patient does not have operative procedure he has such severe pain with motion and staying in bed he is at risk for developing pneumonia.  Plan is for short trochanteric nail for short operative time.

## 2019-02-17 NOTE — Progress Notes (Signed)
CRITICAL VALUE ALERT  Critical Value:  Troponin 160  Date & Time Notied:  02/03/2019 1340  Provider Notified: Dr [place] via amion  Orders Received/Actions taken: none needed - result improving

## 2019-02-17 NOTE — H&P (View-Only) (Signed)
Reason for Consult: Left hip fracture Referring Physician: Medicine service  [name] is an 85 y.o. male.  HPI: Patient is a poor historian.  Family member present in patient's room.  Patient usually ambulates with assistance.  Family members not certain as to whether or not he had a recent fall.  CT abdomen performed yesterday to evaluate left-sided abdominal pain and this showed comminuted fracture of the proximal left femur with impaction at the fracture site.  Fracture involves the intratrochanteric region and extends into the level of the femoral neck.  Dr. Lorin Mercy was contacted and he reviewed the studies.  Past Medical History:  Diagnosis Date  . Anemia   . Blood dyscrasia    ' free bleeder' per patient   . Coronary artery disease    3 coronary stents- followed by Dr Loleta Chance   . Diabetes mellitus without complication ([place])    type II   . Essential hypertension 10/22/2014  . GERD (gastroesophageal reflux disease)   . Multiple abrasions    on arms due to stumbling over chair on 11/19/16 per patient     Past Surgical History:  Procedure Laterality Date  . CARDIAC CATHETERIZATION N/A 10/25/2014   Procedure: Left Heart Cath and Coronary Angiography;  Surgeon: Burnell Blanks, MD;  Location: [place] [facility] [facility];  Service: Cardiovascular;  Laterality: N/A;  . CARDIAC CATHETERIZATION N/A 10/26/2014   Procedure: Coronary Stent Intervention;  Surgeon: Burnell Blanks, MD;  Location: [place] [facility];  Service: Cardiovascular;  Laterality: N/A;  . ESOPHAGOGASTRODUODENOSCOPY (EGD) WITH PROPOFOL N/A 11/25/2016   Procedure: ESOPHAGOGASTRODUODENOSCOPY (EGD) WITH PROPOFOL;  Surgeon: Laurence Spates, MD;  Location: [facility];  Service: Endoscopy;  Laterality: N/A;  . HELLER MYOTOMY  12/17/2011   Procedure: LAPAROSCOPIC HELLER MYOTOMY;  Surgeon: Pedro Earls, MD;  Location: [facility];  Service: General;  Laterality: N/A;  . [place]  . JOINT REPLACEMENT  2007   rt hip    . SAVORY DILATION N/A 11/25/2016   Procedure: SAVORY DILATION;  Surgeon: Laurence Spates, MD;  [facility];  Service: Endoscopy;  Laterality: N/A;  . UPPER GI ENDOSCOPY  12/17/2011   Procedure: UPPER GI ENDOSCOPY;  Surgeon: Pedro Earls, MD;  Location: [facility];  Service: General;  Laterality: N/A;    Family History  Problem Relation Age of Onset  . Heart disease Mother   . Heart disease Father     Social History:  reports that he has quit smoking. He has never used smokeless tobacco. He reports that he does not drink alcohol or use drugs.  Allergies: No Known Allergies  Medications: I have reviewed the patient's current medications.  Results for orders placed or performed during the hospital encounter of 01/21/2019 (from the past 48 hour(s))  Basic metabolic panel     Status: Abnormal   Collection Time: 01/22/2019  1:41 PM  Result Value Ref Range   Sodium 138 135 - 145 mmol/L   Potassium 4.1 3.5 - 5.1 mmol/L   Chloride 108 98 - 111 mmol/L   CO2 21 (L) 22 - 32 mmol/L   Glucose, Bld 147 (H) 70 - 99 mg/dL   BUN 33 (H) 8 - 23 mg/dL   Creatinine, Ser 2.00 (H) 0.61 - 1.24 mg/dL   Calcium 9.0 8.9 - 10.3 mg/dL   GFR calc non Af Amer 28 (L) >60 mL/min   GFR calc Af Amer 32 (L) >60 mL/min   Anion gap 9 5 - 15  Comment: Performed at [facility], [place] [address], [place], [place] [address]  CBC     Status: Abnormal   Collection Time: 01/24/2019  1:41 PM  Result Value Ref Range   WBC 18.6 (H) 4.0 - 10.5 K/uL   RBC 3.81 (L) 4.22 - 5.81 MIL/uL   Hemoglobin 11.5 (L) 13.0 - 17.0 g/dL   HCT 35.9 (L) 39.0 - 52.0 %   MCV 94.2 80.0 - 100.0 fL   MCH 30.2 26.0 - 34.0 pg   MCHC 32.0 30.0 - 36.0 g/dL   RDW 14.0 11.5 - 15.5 %   Platelets 177 150 - 400 K/uL   nRBC 0.0 0.0 - 0.2 %    Comment: Performed at [facility], [place] [address], [place], [place] [address]  Troponin I (High Sensitivity)     Status: Abnormal   Collection Time: 01/25/2019  1:41 PM   Result Value Ref Range   Troponin I (High Sensitivity) 186 (HH) <18 ng/L    Comment: CRITICAL RESULT CALLED TO, READ BACK BY AND VERIFIED WITH: K.WICKER AT 1603 ON 01/24/2019 BY N.THOMPSON (NOTE) Elevated high sensitivity troponin I (hsTnI) values and significant  changes across serial measurements may suggest ACS but many other  chronic and acute conditions are known to elevate hsTnI results.  Refer to the Links section for chest pain algorithms and additional  guidance. Performed at [facility], [place] [address], [place], [place] [address]   Hepatic function panel     Status: Abnormal   Collection Time: 02/06/2019  1:41 PM  Result Value Ref Range   Total Protein 5.6 (L) 6.5 - 8.1 g/dL   Albumin 3.1 (L) 3.5 - 5.0 g/dL   AST 19 15 - 41 U/L   ALT 14 0 - 44 U/L   Alkaline Phosphatase 48 38 - 126 U/L   Total Bilirubin 1.7 (H) 0.3 - 1.2 mg/dL   Bilirubin, Direct 0.2 0.0 - 0.2 mg/dL   Indirect Bilirubin 1.5 (H) 0.3 - 0.9 mg/dL    Comment: Performed at [facility], [place] [address], [place], [place] [address]  Differential     Status: Abnormal   Collection Time: 02/06/2019  1:41 PM  Result Value Ref Range   Neutrophils Relative % 84 %   Neutro Abs 15.7 (H) 1.7 - 7.7 K/uL   Lymphocytes Relative 8 %   Lymphs Abs 1.4 0.7 - 4.0 K/uL   Monocytes Relative 5 %   Monocytes Absolute 1.0 0.1 - 1.0 K/uL   Eosinophils Relative 2 %   Eosinophils Absolute 0.3 0.0 - 0.5 K/uL   Basophils Relative 0 %   Basophils Absolute 0.1 0.0 - 0.1 K/uL   Immature Granulocytes 1 %   Abs Immature Granulocytes 0.11 (H) 0.00 - 0.07 K/uL    Comment: Performed at [facility], [place] [address] [place],  [address]  POC SARS Coronavirus 2 Ag-ED - Nasal Swab (BD Veritor Kit)     Status: None   Collection Time: 02/04/2019  3:30 PM  Result Value Ref Range   SARS Coronavirus 2 Ag NEGATIVE NEGATIVE    Comment: (NOTE) SARS-CoV-2 antigen NOT DETECTED.  Negative  results are presumptive.  Negative results do not preclude SARS-CoV-2 infection and should not be used as the sole basis for treatment or other patient management decisions, including infection  control decisions, particularly in the presence of clinical signs and  symptoms consistent with COVID-19, or in those who have been in  contact with the virus.  Negative results must be combined with clinical observations, patient history, and epidemiological information. The expected result is Negative. Fact Sheet for Patients: PodPark.tn Fact Sheet for Healthcare Providers: GiftContent.is This test is not yet approved or cleared by the [place] FDA and  has been authorized for detection and/or diagnosis of SARS-CoV-2 by FDA under an Emergency Use Authorization (EUA).  This EUA will remain in effect (meaning this test can be used) for the duration of  the COVID-19 de claration under Section 564(b)(1) of the Act, 21 U.S.C. section 360bbb-3(b)(1), unless the authorization is terminated or revoked sooner.   SARS CORONAVIRUS 2 (TAT 6-24 HRS) Nasopharyngeal Nasopharyngeal Swab     Status: None   Collection Time: 01/19/2019  4:52 PM   Specimen: Nasopharyngeal Swab  Result Value Ref Range   SARS Coronavirus 2 NEGATIVE NEGATIVE    Comment: (NOTE) SARS-CoV-2 target nucleic acids are NOT DETECTED. The SARS-CoV-2 RNA is generally detectable in upper and lower respiratory specimens during the acute phase of infection. Negative results do not preclude SARS-CoV-2 infection, do not rule out co-infections with other pathogens, and should not be used as the sole basis for treatment or other patient management decisions. Negative results must be combined with clinical observations, patient history, and epidemiological information. The expected result is Negative. Fact Sheet for Patients: SugarRoll.be Fact Sheet for  Healthcare Providers: https://www.woods-mathews.com/ This test is not yet approved or cleared by the [place] FDA and  has been authorized for detection and/or diagnosis of SARS-CoV-2 by FDA under an Emergency Use Authorization (EUA). This EUA will remain  in effect (meaning this test can be used) for the duration of the COVID-19 declaration under Section 56 4(b)(1) of the Act, 21 U.S.C. section 360bbb-3(b)(1), unless the authorization is terminated or revoked sooner. Performed at [facility], [place] [address], [place], [place] [address]   Urinalysis, Routine w reflex microscopic     Status: Abnormal   Collection Time: 02/04/2019  5:10 PM  Result Value Ref Range   Color, Urine YELLOW YELLOW   APPearance CLEAR CLEAR   Specific Gravity, Urine 1.018 1.005 - 1.030   pH 5.0 5.0 - 8.0   Glucose, UA 50 (A) NEGATIVE mg/dL   Hgb urine dipstick NEGATIVE NEGATIVE   Bilirubin Urine NEGATIVE NEGATIVE   Ketones, ur NEGATIVE NEGATIVE mg/dL   Protein, ur 30 (A) NEGATIVE mg/dL   Nitrite NEGATIVE NEGATIVE   Leukocytes,Ua NEGATIVE NEGATIVE   RBC / HPF 0-5 0 - 5 RBC/hpf   WBC, UA 0-5 0 - 5 WBC/hpf   Bacteria, UA NONE SEEN NONE SEEN    Comment: Performed at [facility], [place] [address], [place], [place] [address]  Troponin I (High Sensitivity)     Status: Abnormal   Collection Time: 01/30/2019  7:36 PM  Result Value Ref Range   Troponin I (High Sensitivity) 213 (HH) <18 ng/L    Comment: DELTA CHECK NOTED CRITICAL RESULT CALLED TO, READ BACK BY AND VERIFIED WITH: FRAZIER,A @ 2236 ON [id number] BY POTEAT,S (NOTE) Elevated high sensitivity troponin I (hsTnI) values and significant  changes across serial measurements may suggest ACS but many other  chronic and acute conditions are known to elevate hsTnI results.  Refer to the Links section for chest pain algorithms and additional  guidance. Performed at [facility], [place] [address] [place], [place] [address]   Protime-INR     Status: None   Collection Time: 02/18/2019  7:36  PM  Result Value Ref Range   Prothrombin Time 14.5 11.4 - 15.2 seconds   INR 1.1 0.8 - 1.2    Comment: (NOTE) INR goal varies based on device and disease states. Performed at [facility], [place] [address], [place], [place] [address]   VITAMIN D 25 Hydroxy (Vit-D Deficiency, Fractures)     Status: None   Collection Time: 01/26/2019  7:36 PM  Result Value Ref Range   Vit D, 25-Hydroxy 32.45 30 - 100 ng/mL    Comment: (NOTE) Vitamin D deficiency has been defined by the [place] practice guideline as a level of serum 25-OH  vitamin D less than 20 ng/mL (1,2). The Endocrine Society went on to  further define vitamin D insufficiency as a level between 21 and 29  ng/mL (2). 1. IOM (Institute of Medicine). 2010. Dietary reference intakes for  calcium and D. [place]: The [employer]. 2. Holick MF, Binkley [place], Bischoff-Ferrari HA, et al. Evaluation,  treatment, and prevention of vitamin D deficiency: an Endocrine  Society clinical practice guideline, JCEM. 2011 Jul; 96(7): 1911-30. Performed at [facility], [place] [address], [place], [place] [address]   Type and screen [place]     Status: None   Collection Time: 01/29/2019  7:36 PM  Result Value Ref Range   ABO/RH(D) B POS    Antibody Screen NEG    Sample Expiration      02/19/2019,2359 Performed at [facility], [place] [address], [place], [place] [address]   ABO/Rh     Status: None   Collection Time: 01/29/2019  7:36 PM  Result Value Ref Range   ABO/RH(D)      B POS Performed at [facility], [place] [address], [place], [place] [address]   CBC     Status: Abnormal   Collection Time: 01/27/2019  3:27 AM  Result Value Ref Range   WBC 15.6 (H) 4.0 - 10.5 K/uL   RBC 3.58 (L) 4.22 - 5.81 MIL/uL   Hemoglobin 10.9  (L) 13.0 - 17.0 g/dL   HCT 34.7 (L) 39.0 - 52.0 %   MCV 96.9 80.0 - 100.0 fL   MCH 30.4 26.0 - 34.0 pg   MCHC 31.4 30.0 - 36.0 g/dL   RDW 14.1 11.5 - 15.5 %   Platelets 157 150 - 400 K/uL   nRBC 0.0 0.0 - 0.2 %    Comment: Performed at [facility], [place] [address] [place], [place] [address]  Basic metabolic panel     Status: Abnormal   Collection Time: 01/27/2019  3:27 AM  Result Value Ref Range   Sodium 140 135 - 145 mmol/L   Potassium 3.9 3.5 - 5.1 mmol/L   Chloride 107 98 - 111 mmol/L   CO2 22 22 - 32 mmol/L   Glucose, Bld 151 (H) 70 - 99 mg/dL   BUN 31 (H) 8 - 23 mg/dL   Creatinine, Ser 1.89 (H) 0.61 - 1.24 mg/dL   Calcium 9.2 8.9 - 10.3 mg/dL   GFR calc non Af Amer 30 (L) >60 mL/min   GFR calc Af Amer 34 (L) >60 mL/min   Anion gap 11 5 - 15    Comment: Performed at [facility], [place] [address] [place], [place] [address]  Troponin I (High Sensitivity)     Status: Abnormal   Collection Time: 01/30/2019 12:37 PM  Result Value  Ref Range   Troponin I (High Sensitivity) 160 (HH) <18 ng/L    Comment: CRITICAL RESULT CALLED TO, READ BACK BY AND VERIFIED WITH: BULLINS, M. @ 1329 02/12/2019 PERRY, J. (NOTE) Elevated high sensitivity troponin I (hsTnI) values and significant  changes across serial measurements may suggest ACS but many other  chronic and acute conditions are known to elevate hsTnI results.  Refer to the Links section for chest pain algorithms and additional  guidance. Performed at [facility], [place] [address], [place], [place] [address]   Surgical pcr screen     Status: None   Collection Time: 02/07/2019  1:05 PM   Specimen: Nasal Mucosa; Nasal Swab  Result Value Ref Range   MRSA, PCR NEGATIVE NEGATIVE   Staphylococcus aureus NEGATIVE NEGATIVE    Comment: (NOTE) The Xpert SA Assay (FDA approved for NASAL specimens in patients 21 years of age and older), is one component of a comprehensive surveillance  program. It is not intended to diagnose infection nor to guide or monitor treatment. Performed at [facility], [place] [address] [place], [place] [address]     CT ABDOMEN PELVIS WO CONTRAST  Result Date: 02/18/2019 CLINICAL DATA:  Left-sided abdominal pain EXAM: CT ABDOMEN AND PELVIS WITHOUT CONTRAST TECHNIQUE: Multidetector CT imaging of the abdomen and pelvis was performed following the standard protocol without IV contrast. COMPARISON:  03/07/2018 FINDINGS: Lower chest: Areas of chronic scarring are again noted in the lung bases bilaterally. Hepatobiliary: Single gallstone is noted within a decompressed gallbladder. The liver is within normal limits. Pancreas: Unremarkable. No pancreatic ductal dilatation or surrounding inflammatory changes. Spleen: Normal in size without focal abnormality. Adrenals/Urinary Tract: Mild adrenal hypertrophy is noted stable from the prior exam. Atrophic left kidney is noted with scattered calcifications. These are stable in appearance from the prior exam. The right kidney appears within normal limits. No obstructive changes are seen. The bladder is well distended. There are multiple calcifications identified in the distal right ureter which appears stable from the prior exam. These do not cause obstructive change. Stomach/Bowel: Mild diverticular change of the colon is noted. Some prominent fecal material is noted within the rectum although no definitive impaction is seen. The appendix is not well visualized. No inflammatory changes are noted. The small bowel is within normal limits. Small sliding-type hiatal hernia is again noted. Vascular/Lymphatic: Aortic atherosclerosis. No enlarged abdominal or pelvic lymph nodes. Reproductive: Prostate is unremarkable. Other: No abdominal wall hernia or abnormality. No abdominopelvic ascites. Musculoskeletal: Right hip replacement is noted. There is a left intratrochanteric femoral fracture with mild impaction  identified at the fracture site. This extends into the femoral neck. This is new from the prior exam. Multilevel compression deformities are again noted in the lumbar spine stable from the prior study. IMPRESSION: Comminuted fracture of the proximal left femur with impaction at the fracture site. The fracture involves the intratrochanteric region and extends into the level of the femoral neck. This is likely the etiology of the left-sided pain. Diverticulosis without diverticulitis. Distal right ureteral stones without obstructive change. These are stable in appearance from the prior exam. Cholelithiasis without complicating factors. Electronically Signed   By: Inez Catalina M.D.   On: 01/30/2019 16:27   DG Chest 1 View  Result Date: 01/25/2019 CLINICAL DATA:  Increased confusion EXAM: CHEST  1 VIEW COMPARISON:  July 28, 2018. FINDINGS: No new consolidation or edema. No pleural effusion or pneumothorax. Cardiomediastinal contours are stable. IMPRESSION: No acute process in the chest.  Electronically Signed   By: Macy Mis M.D.   On: 02/04/2019 15:34   CT Head Wo Contrast  Result Date: 02/18/2019 CLINICAL DATA:  85 year old male with altered mental status. EXAM: CT HEAD WITHOUT CONTRAST TECHNIQUE: Contiguous axial images were obtained from the base of the skull through the vertex without intravenous contrast. COMPARISON:  Head CT dated 10/24/2018. FINDINGS: Brain: Moderate to advanced age-related atrophy and chronic microvascular ischemic changes. 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 There is no acute intracranial hemorrhage. No mass effect or midline shift. No extra-axial fluid collection. Vascular: No hyperdense vessel or unexpected calcification. Skull: Normal. Negative for fracture or focal lesion. Sinuses/Orbits: No acute finding. Other: None IMPRESSION: 1. No acute intracranial hemorrhage. 2. Age-related atrophy and chronic microvascular ischemic changes. 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 Electronically Signed   By: Anner Crete M.D.   On: 01/29/2019 16:22   Chest Portable 1 View  Result Date: 02/10/2019 CLINICAL DATA:  Preop chest x-ray EXAM: PORTABLE CHEST 1 VIEW COMPARISON:  07/28/2018 FINDINGS: There is mild bilateral interstitial thickening likely chronic. There is elevation of the left diaphragm. There is left basilar airspace disease likely reflecting atelectasis versus less likely pneumonia. There is no pleural effusion or pneumothorax. The heart mediastinum are stable. There is no acute osseous abnormality. IMPRESSION: 1. Left basilar airspace disease likely reflecting atelectasis versus less likely pneumonia. Otherwise, no acute cardiopulmonary disease. Electronically Signed   By: Kathreen Devoid   On: 02/11/2019 18:10   ECHOCARDIOGRAM COMPLETE  Result Date: 02/12/2019   ECHOCARDIOGRAM REPORT   Patient Name:   [name] Date of Exam: 02/13/2019 Medical Rec #:  [id number]      Height: Accession #:    [id number]     Weight: Date of Birth:  06/17/1922      BSA: Patient Age:    19 years       BP:           130/75 mmHg Patient Gender: M              HR:           79 bpm. Exam Location:  Inpatient Procedure: 2D Echo, Cardiac Doppler and Color Doppler Indications:    Z01.818 Encounter for other preprocedural examination; R55                 Syncope  History:        Patient has prior history of Echocardiogram examinations, most                 recent 08/18/2017. CAD, Signs/Symptoms:Altered Mental Status and                 Alzheimer's; Risk Factors:Diabetes, Hypertension and                 Dyslipidemia.  Sonographer:    Roseanna Rainbow RDCS Referring Phys: [id number] [place]  Sonographer Comments: Technically difficult study due to poor echo windows. Image acquisition challenging due to respiratory motion. Patient could not follow directions or turn. Patient was moaning, coughing, and audible lung craclkles made the study difficult. IMPRESSIONS  1. Left ventricular  ejection fraction, by visual estimation, is 50 to 55%. The left ventricle has low normal function. There is mildly increased left ventricular hypertrophy.  2. Abnormal septal motion consistent with left bundle branch block.  3. Left ventricular diastolic parameters are consistent with Grade I diastolic dysfunction (impaired relaxation).  4. The left ventricle has no regional wall  motion abnormalities.  5. Global right ventricle has normal systolic function.The right ventricular size is normal. No increase in right ventricular wall thickness.  6. Left atrial size was normal.  7. Right atrial size was normal.  8. Presence of pericardial fat pad.  9. Trivial pericardial effusion is present. 10. Mild mitral annular calcification. 11. The mitral valve is degenerative. Mild mitral valve regurgitation. 12. The tricuspid valve is grossly normal. 13. The aortic valve is tricuspid. Aortic valve regurgitation is not visualized. Mild aortic valve stenosis. 14. There is severe calcifcation of the aortic valve. 15. There is severe thickening of the aortic valve. 16. The pulmonic valve was grossly normal. Pulmonic valve regurgitation is not visualized. 17. Mildly elevated pulmonary artery systolic pressure. 18. The tricuspid regurgitant velocity is 3.02 m/s, and with an assumed right atrial pressure of 8 mmHg, the estimated right ventricular systolic pressure is mildly elevated at 44.5 mmHg. 19. The inferior vena cava is dilated in size with >50% respiratory variability, suggesting right atrial pressure of 8 mmHg. 20. A prior study was performed on 08/18/2017. 21. No significant change from prior study. FINDINGS  Left Ventricle: Left ventricular ejection fraction, by visual estimation, is 50 to 55%. The left ventricle has low normal function. The left ventricle has no regional wall motion abnormalities. The left ventricular internal cavity size was the left ventricle is normal in size. There is mildly increased left ventricular  hypertrophy. Abnormal (paradoxical) septal motion, consistent with left bundle branch block. Left ventricular diastolic parameters are consistent with Grade I diastolic dysfunction (impaired relaxation). Normal left atrial pressure. Right Ventricle: The right ventricular size is normal. No increase in right ventricular wall thickness. Global RV systolic function is has normal systolic function. The tricuspid regurgitant velocity is 3.02 m/s, and with an assumed right atrial pressure  of 8 mmHg, the estimated right ventricular systolic pressure is mildly elevated at 44.5 mmHg. Left Atrium: Left atrial size was normal in size. Right Atrium: Right atrial size was normal in size Pericardium: Trivial pericardial effusion is present. Presence of pericardial fat pad. Mitral Valve: The mitral valve is degenerative in appearance. Mild mitral annular calcification. Mild mitral valve regurgitation. Tricuspid Valve: The tricuspid valve is grossly normal. Tricuspid valve regurgitation is trivial. Aortic Valve: The aortic valve is tricuspid. . There is severe thickening and severe calcifcation of the aortic valve. Aortic valve regurgitation is not visualized. Mild aortic stenosis is present. There is severe thickening of the aortic valve. There is  severe calcifcation of the aortic valve. Aortic valve mean gradient measures 5.4 mmHg. Aortic valve peak gradient measures 10.5 mmHg. Aortic valve area, by VTI measures 1.38 cm. Pulmonic Valve: The pulmonic valve was grossly normal. Pulmonic valve regurgitation is not visualized. Pulmonic regurgitation is not visualized. Aorta: The aortic root and ascending aorta are structurally normal, with no evidence of dilitation. Venous: The inferior vena cava is dilated in size with greater than 50% respiratory variability, suggesting right atrial pressure of 8 mmHg. IAS/Shunts: No atrial level shunt detected by color flow Doppler. Additional Comments: A prior study was performed on 08/18/2017.   LEFT VENTRICLE PLAX 2D LVIDd:         3.50 cm LVIDs:         2.70 cm LV PW:         1.10 cm LV IVS:        1.30 cm LVOT diam:     1.80 cm LV SV:         24 ml  LVOT Area:     2.54 cm  RIGHT VENTRICLE RV S prime:     10.00 cm/s LEFT ATRIUM LA Vol (A2C): 26.7 ml LA Vol (A4C): 21.2 ml  AORTIC VALVE AV Area (Vmax):    1.73 cm AV Area (Vmean):   1.69 cm AV Area (VTI):     1.38 cm AV Vmax:           162.34 cm/s AV Vmean:          107.278 cm/s AV VTI:            0.291 m AV Peak Grad:      10.5 mmHg AV Mean Grad:      5.4 mmHg LVOT Vmax:         110.18 cm/s LVOT Vmean:        71.284 cm/s LVOT VTI:          0.158 m LVOT/AV VTI ratio: 0.54  AORTA Ao Root diam: 3.50 cm Ao Asc diam:  3.00 cm MV E velocity: 74.00 cm/s  103 cm/s  TRICUSPID VALVE MV A velocity: 130.00 cm/s 70.3 cm/s TR Peak grad:   36.5 mmHg MV E/A ratio:  0.57        1.5       TR Vmax:        301.88 cm/s                                       SHUNTS                                      Systemic VTI:  0.16 m                                      Systemic Diam: 1.80 cm  Eleonore Chiquito MD Electronically signed by Eleonore Chiquito MD Signature Date/Time: 01/26/2019/10:45:42 AM    Final     Review of Systems Blood pressure 138/76, pulse 69, temperature 99.2 F (37.3 C), temperature source Oral, resp. rate 20, SpO2 95 %. Physical Exam  Constitutional: No distress.  Patient resting comfortably.  Family member present in room.  Respiratory: No respiratory distress.  Musculoskeletal:     Comments: Patient has some shortening of the left lower extremity.  Pedal pulses intact.    Assessment/Plan: Left hip fracture.  Dr. Lorin Mercy will take patient to the OR this evening for ORIF.  Surgical procedure discussed with family member present.  All questions answered.  Benjiman Core 02/04/2019, 3:46 PM

## 2019-02-17 NOTE — Consult Note (Signed)
Cardiology Consultation:   Patient ID: [name]; [id number]; August 28, 1922   Admit date: 09-05-18 Date of Consult: 02/18/2019  Primary Care Provider: Merlene LaughterStoneking, Hal, MD Primary Cardiologist: Remote-Dr. Royann Shiversroitoru, MD   Patient Profile:   [name] is a 85 y.o. male with a hx of DM2, HTN, GERD, CKD stage III and CAD s/p  who is being seen today for pre-operative evaluation for hip fx repair at the request of Dr. Ophelia CharterYates.  History of Present Illness:   [name] is a 85yo M with a hx as stated above who [facility] with confusion and abdominal pain from his ALF. HPI obtained from chart review given altered mental status. He was initially brought to the ED for further evaluation by a caregiver. Caregiver reported significant confusion for three days prior to presentation however family and caregivers have noticed a steady decline over the last several weeks. Caregiver reported refusal to bear weight on left leg and had noticed more fatigue as well as c/o abdominal pain.  In the ED, abdominal CT performed to r/o diverticulitis or possible impaction however imaging revealed left sided femur fracture. Creatinine was elevated at 1.89 and HsT was found to be 186>>213 with no c/o angina.CXR with no acute cardiopulmonary process. Patient was admitted to hospitalist service with surgical consultation. Plan is for trochanteric nail later today, 01/24/2019. Cardiology has been consulted for pre-operative evaluation in the setting of CAD with prior PCI x2 dating back to 2016.   At baseline, patient noted to have very poor functional quality with 24 hour care. No family at bedside to confirm and patient continues to be acutely confused.   He was admitted 10/2014 with chest pain in which he underwent Lexiscan Myoview which was found to be high risk. Subsequently he underwent LHC which revealed high-grade LCx and LAD disease. Given his hx of CKD stage III, he was brought back to the  lab several days later for PCI to OM2 and mLAD, performed 10/26/2014. EF was 50-55% with mild diastolic dysfunction by echocardiogram.   He was last seen by Dr. Royann Shiversroitoru 10/05/2015 in follow up where he was noted to be doing well from a CV standpoint without anginal symptoms. He was taken off his Plavix at that time given time since PCI placement. He has not been seen by our team since that time.   Past Medical History:  Diagnosis Date   Anemia    Blood dyscrasia    ' free bleeder' per patient    Coronary artery disease    3 coronary stents- followed by Dr Gwenlyn Saranroituri    Diabetes mellitus without complication (HCC)    type II    Essential hypertension 10/22/2014   GERD (gastroesophageal reflux disease)    Multiple abrasions    on arms due to stumbling over chair on 11/19/16 per patient     Past Surgical History:  Procedure Laterality Date   CARDIAC CATHETERIZATION N/A 10/25/2014   Procedure: Left Heart Cath and Coronary Angiography;  Surgeon: Kathleene Hazelhristopher D McAlhany, MD;  [facility];  Service: Cardiovascular;  Laterality: N/A;   CARDIAC CATHETERIZATION N/A 10/26/2014   Procedure: Coronary Stent Intervention;  Surgeon: Kathleene Hazelhristopher D McAlhany, MD;  [facility];  Service: Cardiovascular;  Laterality: N/A;   ESOPHAGOGASTRODUODENOSCOPY (EGD) WITH PROPOFOL N/A 11/25/2016   Procedure: ESOPHAGOGASTRODUODENOSCOPY (EGD) WITH PROPOFOL;  Surgeon: Carman ChingEdwards, James, MD;  Location: [facility] ENDOSCOPY;  Service: Endoscopy;  Laterality: N/A;   HELLER MYOTOMY  12/17/2011   Procedure: LAPAROSCOPIC HELLER MYOTOMY;  Surgeon: Pedro Earls, MD;  Location: [facility] ORS;  Service: General;  Laterality: N/A;   [place]  2007   rt hip    SAVORY DILATION N/A 11/25/2016   Procedure: SAVORY DILATION;  Surgeon: Laurence Spates, MD;  Location: [facility] ENDOSCOPY;  Service: Endoscopy;  Laterality: N/A;   UPPER GI ENDOSCOPY  12/17/2011   Procedure: UPPER GI  ENDOSCOPY;  Surgeon: Pedro Earls, MD;  Location: [facility] ORS;  Service: General;  Laterality: N/A;     Prior to Admission medications   Medication Sig Start Date End Date Taking? Authorizing Provider  acetaminophen (TYLENOL) 325 MG tablet Take 650 mg by mouth every 6 (six) hours as needed for mild pain, moderate pain, fever or headache.    Yes [provider]  amLODipine (NORVASC) 2.5 MG tablet Take 2.5 mg by mouth every evening.   Yes [provider]  docusate sodium (COLACE) 100 MG capsule Take 100 mg by mouth 2 (two) times daily as needed for mild constipation.   Yes [provider]  ferrous sulfate 325 (65 FE) MG tablet Take 325 mg by mouth every Monday, Wednesday, and Friday.    Yes [provider]  metoprolol tartrate (LOPRESSOR) 25 MG tablet Take 25 mg by mouth 2 (two) times daily. 08/11/17  Yes [provider]  multivitamin-lutein (OCUVITE-LUTEIN) CAPS capsule Take 1 capsule by mouth daily.   Yes [provider]  nitroGLYCERIN (NITROSTAT) 0.4 MG SL tablet Place 0.4 mg under the tongue every 5 (five) minutes as needed for chest pain.   Yes [provider]  omeprazole (PRILOSEC) 20 MG capsule Take 20 mg by mouth daily. 08/05/17  Yes [provider]  doxycycline (VIBRAMYCIN) 100 MG capsule Take 1 capsule (100 mg total) by mouth 2 (two) times daily. One po bid x 7 days Patient not taking: Reported on 01/23/2019 07/20/18   Ripley Fraise, MD  furosemide (LASIX) 20 MG tablet Take 1 tablet (20 mg total) by mouth every Monday, Wednesday, and Friday. Patient not taking: Reported on 01/19/2019 08/20/17   Roney Jaffe, MD  lidocaine (LIDODERM) 5 % Place 1 patch onto the skin daily. Remove & Discard patch within 12 hours or as directed by MD Patient not taking: Reported on 08/18/2017 07/05/17   Damita Lack, MD  metFORMIN (GLUCOPHAGE-XR) 500 MG 24 hr tablet Take 1 tablet (500 mg total) by mouth every morning. Patient not  taking: Reported on 03/07/2018 10/29/14   Erlene Quan, PA-C  polyethylene glycol [facility]) packet Take 17 g by mouth daily as needed for moderate constipation or severe constipation. Patient not taking: Reported on 02/04/2019 07/05/17   Damita Lack, MD  senna (SENOKOT) 8.6 MG TABS tablet Take 1 tablet (8.6 mg total) by mouth at bedtime as needed for mild constipation. Patient not taking: Reported on 02/09/2019 07/05/17   Damita Lack, MD    Inpatient Medications: Scheduled Meds:  amLODipine  2.5 mg Oral QPM   ferrous sulfate  325 mg Oral Q M,W,F   mouth rinse  15 mL Mouth Rinse BID   metoprolol tartrate  25 mg Oral BID   pantoprazole  40 mg Oral Daily   senna  1 tablet Oral BID   sodium chloride flush  3 mL Intravenous Once   Continuous Infusions:  dextrose 5% lactated ringers 75 mL/hr at 2019/03/01 0600   PRN Meds: acetaminophen, bisacodyl, docusate sodium, HYDROcodone-acetaminophen, morphine injection,  polyethylene glycol  Allergies:   No Known Allergies  Social History:   Social History   Socioeconomic History   Marital status: Married    Spouse name: Not on file   Number of children: Not on file   Years of education: Not on file   Highest education level: Not on file  Occupational History   Not on file  Tobacco Use   Smoking status: Former Smoker   Smokeless tobacco: Never Used   Tobacco comment: stopped 28yrs ago  Substance and Sexual Activity   Alcohol use: No   Drug use: No   Sexual activity: Not on file  Other Topics Concern   Not on file  Social History Narrative   Not on file   Social Determinants of Health   Financial Resource Strain:    Difficulty of Paying Living Expenses: Not on file  Food Insecurity:    Worried About Running Out of Food in the Last Year: Not on file   [employer] of Food in the Last Year: Not on file  Transportation Needs:    Lack of Transportation (Medical): Not on file   Lack of Transportation  (Non-Medical): Not on file  Physical Activity:    Days of Exercise per Week: Not on file   Minutes of Exercise per Session: Not on file  Stress:    Feeling of Stress : Not on file  Social Connections:    Frequency of Communication with Friends and Family: Not on file   Frequency of Social Gatherings with Friends and Family: Not on file   Attends Religious Services: Not on file   Active Member of Clubs or Organizations: Not on file   Attends Banker Meetings: Not on file   Marital Status: Not on file  Intimate Partner Violence:    Fear of Current or Ex-Partner: Not on file   Emotionally Abused: Not on file   Physically Abused: Not on file   Sexually Abused: Not on file    Family History:   Family History  Problem Relation Age of Onset   Heart disease Mother    Heart disease Father    Family Status:  Family Status  Relation Name Status   Mother  Deceased   Father  Deceased    ROS:  Please see the history of present illness.  All other ROS reviewed and negative.     Physical Exam/Data:   Vitals:   01/26/2019 2100 01/19/2019 2341 03/13/2019 0434 03/13/19 0827  BP: 127/69 131/84 130/75 (!) 142/76  Pulse: 81 91 79 83  Resp: 20  (!) 22 20  Temp: 98.7 F (37.1 C)  98.4 F (36.9 C) 99 F (37.2 C)  TempSrc: Oral  Oral Oral  SpO2: 95%  93% 93%    Intake/Output Summary (Last 24 hours) at 03-13-19 1107 Last data filed at 2019/03/13 0600 Gross per 24 hour  Intake 413.01 ml  Output --  Net 413.01 ml   There were no vitals filed for this visit. There is no height or weight on file to calculate BMI.   Constitutional: No clinical distress, unable to assess further due to mental status. Cardiovascular: regular rhythm, normal rate, no murmurs. S1 and S2 normal. Radial pulses normal bilaterally. No jugular venous distention.  Respiratory: clear to auscultation bilaterally GI : normal bowel sounds, soft and nontender. No distention.   MSK:  extremities warm, well perfused. No edema.  NEURO: altered, unable to assess. PSYCH: altered, unable to assess.   EKG:  The EKG was personally reviewed and demonstrates: 02/11/2019 NSR with LBBB (old) and evidence of LVH, HR 86bpm and no acute changes Telemetry:  Telemetry was personally reviewed and demonstrates:  NSR  Relevant CV Studies:  LHC 10/26/14:  LAD:  prox to mid 20%, mid 90%, dist 90% LCx:  Mid 30%, OM2 ostial 95% RCA:  prox 20%, mid 30%, R post AV branch 20% PCI:  DES to OM2; DES to mid LAD and DES to dist LAD  2nd Mrg lesion, 95% stenosed. There is a 0% residual stenosis post intervention.  A drug-eluting stent was placed.  Dist LAD lesion, 90% stenosed. There is a 0% residual stenosis post intervention.  A drug-eluting stent was placed.  Mid LAD lesion, 90% stenosed. There is a 0% residual stenosis post intervention.  A drug-eluting stent was placed.  1. Unstable angina 2. Severe stenosis mid LAD, now s/p successful PTCA/DES x 2 mid LAD 3. Severe stenosis mid Circumflex, now s/p successful PTCA DES x 1 mid Circumflex.   Recommendations: He will need DAPT with ASA and Plavix for at least one year. Continue beta blocker and statin. Probable d/c home in am.       LHC 10/25/14:   Prox RCA lesion, 20% stenosed.  Mid RCA lesion, 30% stenosed.  Post Atrio lesion, 20% stenosed.  Mid LAD lesion, 90% stenosed.  Dist LAD lesion, 90% stenosed.  2nd Mrg lesion, 95% stenosed.  Mid Cx lesion, 30% stenosed.  Prox LAD to Mid LAD lesion, 20% stenosed.  1. Severe double vessel CAD with severe tandem stenoses mid LAD and severe stenosis OM2.  2. Unstable angina with high risk stress test.   Recommendations: Will hydrate aggressively tonight. Will plan staged PCI of the LAD and OM branch tomorrow afternoon if renal function stable. Plavix 600 mg given x 1 in the cath lab.   Carotid US 10/25/14 Bilateral - 1% to 39% iCA stenosis  Echo 10/22/14 EF 55-60%,  normal wall motion, grade 1 diastolic dysfunction, trivial AI  Myoview 10/22/14 IMPRESSION: 1. Moderate size infarct of the anterior septal wall with mild peri-infarct ischemia. 2. Moderate sized region of moderate reversible ischemia in the mid and basilar segments of the inferolateral wall. 2. Dyskinesia of the anterior septal wall. 3. Left ventricular ejection fraction 56% 4. High -risk stress test findings*.  Laboratory Data:  Chemistry Recent Labs  Lab 02/15/2019 1341 01/20/2019 0327  NA 138 140  K 4.1 3.9  CL 108 107  CO2 21* 22  GLUCOSE 147* 151*  BUN 33* 31*  CREATININE 2.00* 1.89*  CALCIUM 9.0 9.2  GFRNONAA 28* 30*  GFRAA 32* 34*  ANIONGAP 9 11    Total Protein  Date Value Ref Range Status  01/20/2019 5.6 (L) 6.5 - 8.1 g/dL Final   Albumin  Date Value Ref Range Status  02/07/2019 3.1 (L) 3.5 - 5.0 g/dL Final   AST  Date Value Ref Range Status  01/30/2019 19 15 - 41 U/L Final   ALT  Date Value Ref Range Status  01/22/2019 14 0 - 44 U/L Final   Alkaline Phosphatase  Date Value Ref Range Status  02/18/2019 48 38 - 126 U/L Final   Total Bilirubin  Date Value Ref Range Status  02/05/2019 1.7 (H) 0.3 - 1.2 mg/dL Final   Hematology Recent Labs  Lab 02/03/2019 1341 02/03/2019 0327  WBC 18.6* 15.6*  RBC 3.81* 3.58*  HGB 11.5* 10.9*  HCT 35.9* 34.7*  MCV 94.2 96.9  MCH 30.2 30.4  MCHC 32.0 31.4  RDW 14.0 14.1  PLT 177 157   Cardiac EnzymesNo results for input(s): TROPONINI in the last 168 hours. No results for input(s): TROPIPOC in the last 168 hours.  BNPNo results for input(s): BNP, PROBNP in the last 168 hours.  DDimer No results for input(s): DDIMER in the last 168 hours. TSH: No results found for: TSH Lipids: Lab Results  Component Value Date   CHOL 97 08/18/2017   HDL 46 08/18/2017   LDLCALC 40 08/18/2017   TRIG 53 08/18/2017   CHOLHDL 2.1 08/18/2017   HgbA1c: Lab Results  Component Value Date   HGBA1C 5.6 08/18/2017     Radiology/Studies:  CT ABDOMEN PELVIS WO CONTRAST  Result Date: 2019/03/10 CLINICAL DATA:  Left-sided abdominal pain EXAM: CT ABDOMEN AND PELVIS WITHOUT CONTRAST TECHNIQUE: Multidetector CT imaging of the abdomen and pelvis was performed following the standard protocol without IV contrast. COMPARISON:  03/07/2018 FINDINGS: Lower chest: Areas of chronic scarring are again noted in the lung bases bilaterally. Hepatobiliary: Single gallstone is noted within a decompressed gallbladder. The liver is within normal limits. Pancreas: Unremarkable. No pancreatic ductal dilatation or surrounding inflammatory changes. Spleen: Normal in size without focal abnormality. Adrenals/Urinary Tract: Mild adrenal hypertrophy is noted stable from the prior exam. Atrophic left kidney is noted with scattered calcifications. These are stable in appearance from the prior exam. The right kidney appears within normal limits. No obstructive changes are seen. The bladder is well distended. There are multiple calcifications identified in the distal right ureter which appears stable from the prior exam. These do not cause obstructive change. Stomach/Bowel: Mild diverticular change of the colon is noted. Some prominent fecal material is noted within the rectum although no definitive impaction is seen. The appendix is not well visualized. No inflammatory changes are noted. The small bowel is within normal limits. Small sliding-type hiatal hernia is again noted. Vascular/Lymphatic: Aortic atherosclerosis. No enlarged abdominal or pelvic lymph nodes. Reproductive: Prostate is unremarkable. Other: No abdominal wall hernia or abnormality. No abdominopelvic ascites. Musculoskeletal: Right hip replacement is noted. There is a left intratrochanteric femoral fracture with mild impaction identified at the fracture site. This extends into the femoral neck. This is new from the prior exam. Multilevel compression deformities are again noted in the  lumbar spine stable from the prior study. IMPRESSION: Comminuted fracture of the proximal left femur with impaction at the fracture site. The fracture involves the intratrochanteric region and extends into the level of the femoral neck. This is likely the etiology of the left-sided pain. Diverticulosis without diverticulitis. Distal right ureteral stones without obstructive change. These are stable in appearance from the prior exam. Cholelithiasis without complicating factors. Electronically Signed   By: Alcide Clever M.D.   On: 2019/03/10 16:27   DG Chest 1 View  Result Date: 03-10-19 CLINICAL DATA:  Increased confusion EXAM: CHEST  1 VIEW COMPARISON:  July 28, 2018. FINDINGS: No new consolidation or edema. No pleural effusion or pneumothorax. Cardiomediastinal contours are stable. IMPRESSION: No acute process in the chest. Electronically Signed   By: Guadlupe Spanish M.D.   On: Mar 10, 2019 15:34   CT Head Wo Contrast  Result Date: 03-10-19 CLINICAL DATA:  85 year old male with altered mental status. EXAM: CT HEAD WITHOUT CONTRAST TECHNIQUE: Contiguous axial images were obtained from the base of the skull through the vertex without intravenous contrast. COMPARISON:  Head CT dated 10/24/2018. FINDINGS: Brain: Moderate to advanced age-related atrophy and chronic microvascular ischemic changes. 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  There is no acute intracranial hemorrhage. No mass effect or midline shift. No extra-axial fluid collection. Vascular: No hyperdense vessel or unexpected calcification. Skull: Normal. Negative for fracture or focal lesion. Sinuses/Orbits: No acute finding. Other: None IMPRESSION: 1. No acute intracranial hemorrhage. 2. Age-related atrophy and chronic microvascular ischemic changes. 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 Electronically Signed   By: Elgie Collard M.D.   On: 03-02-19 16:22   Chest Portable 1 View  Result Date: 03/02/19 CLINICAL DATA:   Preop chest x-ray EXAM: PORTABLE CHEST 1 VIEW COMPARISON:  07/28/2018 FINDINGS: There is mild bilateral interstitial thickening likely chronic. There is elevation of the left diaphragm. There is left basilar airspace disease likely reflecting atelectasis versus less likely pneumonia. There is no pleural effusion or pneumothorax. The heart mediastinum are stable. There is no acute osseous abnormality. IMPRESSION: 1. Left basilar airspace disease likely reflecting atelectasis versus less likely pneumonia. Otherwise, no acute cardiopulmonary disease. Electronically Signed   By: Elige Ko   On: 03-02-19 18:10   ECHOCARDIOGRAM COMPLETE  Result Date: 01/27/2019   ECHOCARDIOGRAM REPORT   Patient Name:   [name] Date of Exam: 01/22/2019 Medical Rec #:  [id number]      Height: Accession #:    [id number]     Weight: Date of Birth:  08/04/1922      BSA: Patient Age:    85 years       BP:           130/75 mmHg Patient Gender: M              HR:           79 bpm. Exam Location:  Inpatient Procedure: 2D Echo, Cardiac Doppler and Color Doppler Indications:    Z01.818 Encounter for other preprocedural examination; R55                 Syncope  History:        Patient has prior history of Echocardiogram examinations, most                 recent 08/18/2017. CAD, Signs/Symptoms:Altered Mental Status and                 Alzheimer's; Risk Factors:Diabetes, Hypertension and                 Dyslipidemia.  Sonographer:    Sheralyn Boatman RDCS Referring Phys: [id number] [facility] M PATEL  Sonographer Comments: Technically difficult study due to poor echo windows. Image acquisition challenging due to respiratory motion. Patient could not follow directions or turn. Patient was moaning, coughing, and audible lung craclkles made the study difficult. IMPRESSIONS  1. Left ventricular ejection fraction, by visual estimation, is 50 to 55%. The left ventricle has low normal function. There is mildly increased left ventricular hypertrophy.  2.  Abnormal septal motion consistent with left bundle branch block.  3. Left ventricular diastolic parameters are consistent with Grade I diastolic dysfunction (impaired relaxation).  4. The left ventricle has no regional wall motion abnormalities.  5. Global right ventricle has normal systolic function.The right ventricular size is normal. No increase in right ventricular wall thickness.  6. Left atrial size was normal.  7. Right atrial size was normal.  8. Presence of pericardial fat pad.  9. Trivial pericardial effusion is present. 10. Mild mitral annular calcification. 11. The mitral valve is degenerative. Mild mitral valve regurgitation. 12. The tricuspid valve is grossly normal. 13. The aortic valve is tricuspid. Aortic  valve regurgitation is not visualized. Mild aortic valve stenosis. 14. There is severe calcifcation of the aortic valve. 15. There is severe thickening of the aortic valve. 16. The pulmonic valve was grossly normal. Pulmonic valve regurgitation is not visualized. 17. Mildly elevated pulmonary artery systolic pressure. 18. The tricuspid regurgitant velocity is 3.02 m/s, and with an assumed right atrial pressure of 8 mmHg, the estimated right ventricular systolic pressure is mildly elevated at 44.5 mmHg. 19. The inferior vena cava is dilated in size with >50% respiratory variability, suggesting right atrial pressure of 8 mmHg. 20. A prior study was performed on 08/18/2017. 21. No significant change from prior study. FINDINGS  Left Ventricle: Left ventricular ejection fraction, by visual estimation, is 50 to 55%. The left ventricle has low normal function. The left ventricle has no regional wall motion abnormalities. The left ventricular internal cavity size was the left ventricle is normal in size. There is mildly increased left ventricular hypertrophy. Abnormal (paradoxical) septal motion, consistent with left bundle branch block. Left ventricular diastolic parameters are consistent with Grade I  diastolic dysfunction (impaired relaxation). Normal left atrial pressure. Right Ventricle: The right ventricular size is normal. No increase in right ventricular wall thickness. Global RV systolic function is has normal systolic function. The tricuspid regurgitant velocity is 3.02 m/s, and with an assumed right atrial pressure  of 8 mmHg, the estimated right ventricular systolic pressure is mildly elevated at 44.5 mmHg. Left Atrium: Left atrial size was normal in size. Right Atrium: Right atrial size was normal in size Pericardium: Trivial pericardial effusion is present. Presence of pericardial fat pad. Mitral Valve: The mitral valve is degenerative in appearance. Mild mitral annular calcification. Mild mitral valve regurgitation. Tricuspid Valve: The tricuspid valve is grossly normal. Tricuspid valve regurgitation is trivial. Aortic Valve: The aortic valve is tricuspid. . There is severe thickening and severe calcifcation of the aortic valve. Aortic valve regurgitation is not visualized. Mild aortic stenosis is present. There is severe thickening of the aortic valve. There is  severe calcifcation of the aortic valve. Aortic valve mean gradient measures 5.4 mmHg. Aortic valve peak gradient measures 10.5 mmHg. Aortic valve area, by VTI measures 1.38 cm. Pulmonic Valve: The pulmonic valve was grossly normal. Pulmonic valve regurgitation is not visualized. Pulmonic regurgitation is not visualized. Aorta: The aortic root and ascending aorta are structurally normal, with no evidence of dilitation. Venous: The inferior vena cava is dilated in size with greater than 50% respiratory variability, suggesting right atrial pressure of 8 mmHg. IAS/Shunts: No atrial level shunt detected by color flow Doppler. Additional Comments: A prior study was performed on 08/18/2017.  LEFT VENTRICLE PLAX 2D LVIDd:         3.50 cm LVIDs:         2.70 cm LV PW:         1.10 cm LV IVS:        1.30 cm LVOT diam:     1.80 cm LV SV:         24 ml  LVOT Area:     2.54 cm  RIGHT VENTRICLE RV S prime:     10.00 cm/s LEFT ATRIUM LA Vol (A2C): 26.7 ml LA Vol (A4C): 21.2 ml  AORTIC VALVE AV Area (Vmax):    1.73 cm AV Area (Vmean):   1.69 cm AV Area (VTI):     1.38 cm AV Vmax:           162.34 cm/s AV Vmean:  107.278 cm/s AV VTI:            0.291 m AV Peak Grad:      10.5 mmHg AV Mean Grad:      5.4 mmHg LVOT Vmax:         110.18 cm/s LVOT Vmean:        71.284 cm/s LVOT VTI:          0.158 m LVOT/AV VTI ratio: 0.54  AORTA Ao Root diam: 3.50 cm Ao Asc diam:  3.00 cm MV E velocity: 74.00 cm/s  103 cm/s  TRICUSPID VALVE MV A velocity: 130.00 cm/s 70.3 cm/s TR Peak grad:   36.5 mmHg MV E/A ratio:  0.57        1.5       TR Vmax:        301.88 cm/s                                       SHUNTS                                      Systemic VTI:  0.16 m                                      Systemic Diam: 1.80 cm  Lennie Odor MD Electronically signed by Lennie Odor MD Signature Date/Time: 01/21/2019/10:45:42 AM    Final    Assessment and Plan:   1.Preoperative assessment for hip fracture repair: I have discussed the patient's care with Dr. Ophelia Charter as well as Dr. [place].  Patient is altered and unable to verbalize orientation or chest pain.  He has not followed with cardiology in several years and it is unclear if he has had any cardiopulmonary symptoms preceding his admission.  Fortunately his echocardiogram does not have any acute concerning changes and his ECG appears stable.  He remains high risk for general anesthesia given inability to assess symptoms in the setting of a fall, the etiology of which is unknown. No clinical signs of heart failure. His troponin elevation may be secondary to being down after the fall versus demand ischemia.  No objective evidence for ACS.  I discussed this with primary service, I do not believe that any further cardiovascular testing will modify the patient's risk. Troponin has down trended, suggesting no active ACS at  this time.  -Presented to [facility] from [facility] with increased but worsening confusion and abdominal pain found to have left femur fracture per CT, likely in the setting of unwitnessed fall with known osteoporosis per primary team -EKG with NSR, old LBBB and stable HR, no acute changes despite elevated troponin -The patient does not have any unstable cardiac symptoms however is high risk to proceed with surgery given advanced age, co-morbid conditions and acute encephalopathy. At baseline, he has very poor functional capacity but further cardiac testing will not change course of surgery. Revised cardiac risk of peri-procedural MI or cardiac arrest following fracture repair is high at 10.1%.  2. CAD s/p PCI to OM/mLAD 2016: -S/p 2 vessel PCI with a synergy DES to be OM 2 and synergy DES 2 to the LAD 2016.   -Plavix stopped 2017>>continued on ASA  -Unable to assess for anginal symptoms -  HsT likely in the setting of demand ischemia given stable LVEF on echo and no acute changes on EKG   3. HTN:    -stable today, 142/76>130/75>131/84 -Continue amlodipine, beta blocker.   4. Hyperlipidemia:    -Last LDL, 40 in 08/2017 -Continue statin  5. CKD stage III:    -Creatinine, 1.89 today with a baseline of 1.8-2.0 -Gentle hydration per primary team    For questions or updates, please contact [facility] Please consult [email or web address] for contact info under Cardiology/STEMI.   Signed, Parke Poisson Mar 12, 2019

## 2019-02-17 NOTE — Progress Notes (Signed)
PROGRESS NOTE    Mike JumboHerley G Straley  ZOX:[id number] DOB: 09-05-1922 DOA: Feb 04, 2019 PCP: Merlene LaughterStoneking, Hal, MD    Brief Narrative:  [name] is a 85 y.o. male with Past medical history of dementia, CAD, type II DM, HTN, GERD who presented from his independent living facility with progressive confusion. Patient was brought into the hospital by caregiver as the caregiver reported that the patient is significantly confused for last few days and not being himself.  At his baseline the patient is alert awake and oriented x2 and pleasantly confused not agitated.  Reportedly per EDP's discussion with Family patient has progressive cognitive decline for last 2 weeks.  Over last 2 days patient has been refusing to bear any weight on his left leg he is also more fatigue and tired. No nausea no vomiting.  No fever no chills.  No chest pain abdominal pain.  At the time of my evaluation patient was in restraints with bilateral mittens and was trying to pull the mittens off.  Patient was not communicating verbally anything.  Not able to meaningfully follow any commands.  Consistently coughing.  ED Course: Presents with confusion and abdominal pain.  CT abdomen is positive for left-sided femur fracture.  Patient was referred for admission for further work-up and treatment.  At his baseline ambulates without assistance dependent for most of his ADL; although does not manages his medication on his own.   Assessment & Plan:   Principal Problem:   Hip fracture due to osteoporosis [facility](HCC) Active Problems:   Essential hypertension   Chronic renal disease, stage III   GERD (gastroesophageal reflux disease)   CAD -S/P LAD and CFX DES 10/26/14   Dyslipidemia   Acute metabolic encephalopathy   Left hip fracture Patient presenting from his independent living facility with progressive confusion and inability to bear weight on his left lower extremity.  Also with apparent unwitnessed fall.  CT abdomen/pelvis on  admission showed a comminuted fracture proximal left femur.  --Orthopedics following, appreciate assistance; Dr. Ophelia CharterYates --Plan possible IMN this afternoon following cardiology evaluation --Pain control with Norco 1-2 tablets q2h prn moderate pain, morphine 2 mg q2h severe pain  Unwitnessed fall Patient presents from independent living with apparent unwitnessed fall, timing uncertain.  Currently has 24/7-hour care at this facility.  Has had multiple abrasions noted on physical exam, which daughter verifies onset roughly 2 weeks ago.  Now with left hip fracture as above.  CT head without IV contrast with no acute findings with age-related atrophy and chronic microvascular changes and old left frontal infarct.  History of CAD status post DES in 2016.  Patient unable to further clarify due to his mental status. TTE with EF 50-55%, abnormal septal motion consistent with LBBB, grade 1 diastolic dysfunction, trivial pericardial effusion, mild aortic valve stenosis; with no significant change from previous echocardiogram in 2019. --Continue fall precautions --Continue to monitor on telemetry --Supportive care  Elevated troponin Hx CAD s/p DES 2016 Patient with history of CAD and underwent drug-eluting stent placement 2016 to LAD and marginal.  Troponin on presentation elevated at 186, 213.  Etiology likely demand ischemia from recent falls and underlying hip fracture.  EKG with NSR, rate 86, QTc 469 with left bundle branch block and no concerning ST elevation/depressions or T wave inversions.  TTE with EF 50-55%, grade 1 diastolic dysfunction which is unchanged from previous echocardiogram in 2019. --Cardiology consulted for further recommendations --Continue to monitor on telemetry  Dysphagia Question possible silent aspiration events, likely  related to his advancing dementia. --Speech therapy following, continue n.p.o. for now; plan on reevaluation tomorrow --Continue IV fluid hydration with D5 LR at 75  mL's per hour  Dementia with behavioral disturbances Acute metabolic encephalopathy Patient presenting from independent living with history of dementia, likely advancing fairly aggressively over the past 2 years per daughter's report. --Continue fall precautions --Supportive care  Leukocytosis WBC count elevated 18.6 on admission.  Urinalysis unrevealing.  Covid-19/SARS-CoV-2 negative.  Chest x-ray with left basilar airspace disease likely reflecting atelectasis rather than pneumonia.  Suspect leukocytosis reactive in nature from acute hip fracture versus dehydration. --WBC 18.6-->15.6 --Continue IV fluid hydration as above --Continue monitor CBC count daily  Essential hypertension BP 130/75 this morning, will fairly well controlled. --Continue amlodipine 2.5 mg p.o. nightly, metoprolol tartrate 25 mg p.o. twice daily. --Holding home furosemide in which he takes 20 mg on Monday/Wednesday/Friday --Monitor blood pressure closely  Iron deficiency anemia: Hemoglobin 10.9 on admission. --Continue ferrous sulfate  on MWF --Monitor CBC daily in the setting of acute hip fracture  CKD stage IIIb Baseline creatinine last year 2.2-2.5 with a GFR of 22-25.  Creatinine on admission 2.00, improved to 1.89 which is better than his last known baseline. --Continue IV fluid hydration as above with D5 LR at 75 mL's per hour --Avoid nephrotoxins, renally dose all medications --Monitor renal function daily  GERD: Continue Protonix 40 mg p.o. daily  DVT prophylaxis: SCDs, holding chemical DVT prophylaxis in anticipation of surgical intervention Code Status: DNR Family Communication: Updated patient's daughter, Lupita Leash via telephone this morning Disposition Plan: Remain inpatient, pending possible surgical intervention, will likely need upgrade in living situation likely SNF on discharge   Consultants:   Orthopedics -Dr. Ophelia Charter  Cardiology  Procedures:   Transthoracic echocardiogram  March 02, 2019: IMPRESSIONS    1. Left ventricular ejection fraction, by visual estimation, is 50 to 55%. The left ventricle has low normal function. There is mildly increased left ventricular hypertrophy.  2. Abnormal septal motion consistent with left bundle branch block.  3. Left ventricular diastolic parameters are consistent with Grade I diastolic dysfunction (impaired relaxation).  4. The left ventricle has no regional wall motion abnormalities.  5. Global right ventricle has normal systolic function.The right ventricular size is normal. No increase in right ventricular wall thickness.  6. Left atrial size was normal.  7. Right atrial size was normal.  8. Presence of pericardial fat pad.  9. Trivial pericardial effusion is present. 10. Mild mitral annular calcification. 11. The mitral valve is degenerative. Mild mitral valve regurgitation. 12. The tricuspid valve is grossly normal. 13. The aortic valve is tricuspid. Aortic valve regurgitation is not visualized. Mild aortic valve stenosis. 14. There is severe calcifcation of the aortic valve. 15. There is severe thickening of the aortic valve. 16. The pulmonic valve was grossly normal. Pulmonic valve regurgitation is not visualized. 17. Mildly elevated pulmonary artery systolic pressure. 18. The tricuspid regurgitant velocity is 3.02 m/s, and with an assumed right atrial pressure of 8 mmHg, the estimated right ventricular systolic pressure is mildly elevated at 44.5 mmHg. 19. The inferior vena cava is dilated in size with >50% respiratory variability, suggesting right atrial pressure of 8 mmHg. 20. A prior study was performed on 08/18/2017. 21. No significant change from prior study.  Antimicrobials:   None   Subjective: Patient seen and examined at bedside, resting comfortably.  Remains very confused.  Nursing present.  Appears to be in significant discomfort overlying his left hip fracture, especially with any type  of movement.   Unable to obtain any further ROS from patient due to his confusion/underlying dementia.  Discussed with patient's daughter via telephone this morning, apparently has had progressive decline in terms of his mentation over the past 2 years.  Currently living in independent living with a 24-hour care, apparent unwitnessed fall sometime over the past 2 weeks and with very difficult ambulation during this timeframe.  No other acute concerns overnight per nursing staff.  Objective: Vitals:   01/26/2019 2100 02/02/2019 2341 01/25/2019 0434 02/02/2019 0827  BP: 127/69 131/84 130/75 (!) 142/76  Pulse: 81 91 79 83  Resp: 20  (!) 22 20  Temp: 98.7 F (37.1 C)  98.4 F (36.9 C) 99 F (37.2 C)  TempSrc: Oral  Oral Oral  SpO2: 95%  93% 93%    Intake/Output Summary (Last 24 hours) at 01/19/2019 1057 Last data filed at 01/25/2019 0600 Gross per 24 hour  Intake 413.01 ml  Output --  Net 413.01 ml   There were no vitals filed for this visit.  Examination:  General exam: Calm, mild pain with manipulation left lower extremity, confused Respiratory system: Coarse breath sounds bilaterally, no wheezing, normal respiratory effort, on room air oxygenating 93%. Cardiovascular system: S1 & S2 heard, RRR. No JVD, murmurs, rubs, gallops or clicks. No pedal edema. Gastrointestinal system: Abdomen is nondistended, soft and nontender. No organomegaly or masses felt. Normal bowel sounds heard. Central nervous system: Confused Extremities: Appears to move all extremities independently, not to command Skin: No rashes, lesions or ulcers Psychiatry: Unable to evaluate secondary to patient's current mental status    Data Reviewed: I have personally reviewed following labs and imaging studies  CBC: Recent Labs  Lab 01/29/2019 1341 01/21/2019 0327  WBC 18.6* 15.6*  NEUTROABS 15.7*  --   HGB 11.5* 10.9*  HCT 35.9* 34.7*  MCV 94.2 96.9  PLT 177 157   Basic Metabolic Panel: Recent Labs  Lab 02/02/2019 1341  02/01/2019 0327  NA 138 140  K 4.1 3.9  CL 108 107  CO2 21* 22  GLUCOSE 147* 151*  BUN 33* 31*  CREATININE 2.00* 1.89*  CALCIUM 9.0 9.2   GFR: CrCl cannot be calculated (Unknown ideal weight.). Liver Function Tests: Recent Labs  Lab 02/01/2019 1341  AST 19  ALT 14  ALKPHOS 48  BILITOT 1.7*  PROT 5.6*  ALBUMIN 3.1*   No results for input(s): LIPASE, AMYLASE in the last 168 hours. No results for input(s): AMMONIA in the last 168 hours. Coagulation Profile: Recent Labs  Lab 01/25/2019 1936  INR 1.1   Cardiac Enzymes: No results for input(s): CKTOTAL, CKMB, CKMBINDEX, TROPONINI in the last 168 hours. BNP (last 3 results) No results for input(s): PROBNP in the last 8760 hours. HbA1C: No results for input(s): HGBA1C in the last 72 hours. CBG: No results for input(s): GLUCAP in the last 168 hours. Lipid Profile: No results for input(s): CHOL, HDL, LDLCALC, TRIG, CHOLHDL, LDLDIRECT in the last 72 hours. Thyroid Function Tests: No results for input(s): TSH, T4TOTAL, FREET4, T3FREE, THYROIDAB in the last 72 hours. Anemia Panel: No results for input(s): VITAMINB12, FOLATE, FERRITIN, TIBC, IRON, RETICCTPCT in the last 72 hours. Sepsis Labs: No results for input(s): PROCALCITON, LATICACIDVEN in the last 168 hours.  Recent Results (from the past 240 hour(s))  SARS CORONAVIRUS 2 (TAT 6-24 HRS) Nasopharyngeal Nasopharyngeal Swab     Status: None   Collection Time: 02/12/2019  4:52 PM   Specimen: Nasopharyngeal Swab  Result Value Ref Range Status  SARS Coronavirus 2 NEGATIVE NEGATIVE Final    Comment: (NOTE) SARS-CoV-2 target nucleic acids are NOT DETECTED. The SARS-CoV-2 RNA is generally detectable in upper and lower respiratory specimens during the acute phase of infection. Negative results do not preclude SARS-CoV-2 infection, do not rule out co-infections with other pathogens, and should not be used as the sole basis for treatment or other patient management  decisions. Negative results must be combined with clinical observations, patient history, and epidemiological information. The expected result is Negative. Fact Sheet for Patients: HairSlick.no Fact Sheet for Healthcare Providers: quierodirigir.com This test is not yet approved or cleared by the [place] FDA and  has been authorized for detection and/or diagnosis of SARS-CoV-2 by FDA under an Emergency Use Authorization (EUA). This EUA will remain  in effect (meaning this test can be used) for the duration of the COVID-19 declaration under Section 56 4(b)(1) of the Act, 21 U.S.C. section 360bbb-3(b)(1), unless the authorization is terminated or revoked sooner. Performed at [facility], [address], [place], [place] [address]          Radiology Studies: CT ABDOMEN PELVIS WO CONTRAST  Result Date: 02/12/2019 CLINICAL DATA:  Left-sided abdominal pain EXAM: CT ABDOMEN AND PELVIS WITHOUT CONTRAST TECHNIQUE: Multidetector CT imaging of the abdomen and pelvis was performed following the standard protocol without IV contrast. COMPARISON:  03/07/2018 FINDINGS: Lower chest: Areas of chronic scarring are again noted in the lung bases bilaterally. Hepatobiliary: Single gallstone is noted within a decompressed gallbladder. The liver is within normal limits. Pancreas: Unremarkable. No pancreatic ductal dilatation or surrounding inflammatory changes. Spleen: Normal in size without focal abnormality. Adrenals/Urinary Tract: Mild adrenal hypertrophy is noted stable from the prior exam. Atrophic left kidney is noted with scattered calcifications. These are stable in appearance from the prior exam. The right kidney appears within normal limits. No obstructive changes are seen. The bladder is well distended. There are multiple calcifications identified in the distal right ureter which appears stable from the prior exam. These do not cause  obstructive change. Stomach/Bowel: Mild diverticular change of the colon is noted. Some prominent fecal material is noted within the rectum although no definitive impaction is seen. The appendix is not well visualized. No inflammatory changes are noted. The small bowel is within normal limits. Small sliding-type hiatal hernia is again noted. Vascular/Lymphatic: Aortic atherosclerosis. No enlarged abdominal or pelvic lymph nodes. Reproductive: Prostate is unremarkable. Other: No abdominal wall hernia or abnormality. No abdominopelvic ascites. Musculoskeletal: Right hip replacement is noted. There is a left intratrochanteric femoral fracture with mild impaction identified at the fracture site. This extends into the femoral neck. This is new from the prior exam. Multilevel compression deformities are again noted in the lumbar spine stable from the prior study. IMPRESSION: Comminuted fracture of the proximal left femur with impaction at the fracture site. The fracture involves the intratrochanteric region and extends into the level of the femoral neck. This is likely the etiology of the left-sided pain. Diverticulosis without diverticulitis. Distal right ureteral stones without obstructive change. These are stable in appearance from the prior exam. Cholelithiasis without complicating factors. Electronically Signed   By: Alcide Clever M.D.   On: 02/10/2019 16:27   DG Chest 1 View  Result Date: 01/26/2019 CLINICAL DATA:  Increased confusion EXAM: CHEST  1 VIEW COMPARISON:  July 28, 2018. FINDINGS: No new consolidation or edema. No pleural effusion or pneumothorax. Cardiomediastinal contours are stable. IMPRESSION: No acute process in the chest. Electronically Signed  By: Guadlupe Spanish M.D.   On: 02/09/2019 15:34   CT Head Wo Contrast  Result Date: 01/29/2019 CLINICAL DATA:  85 year old male with altered mental status. EXAM: CT HEAD WITHOUT CONTRAST TECHNIQUE: Contiguous axial images were obtained from the base  of the skull through the vertex without intravenous contrast. COMPARISON:  Head CT dated 10/24/2018. FINDINGS: Brain: Moderate to advanced age-related atrophy and chronic microvascular ischemic changes. 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 There is no acute intracranial hemorrhage. No mass effect or midline shift. No extra-axial fluid collection. Vascular: No hyperdense vessel or unexpected calcification. Skull: Normal. Negative for fracture or focal lesion. Sinuses/Orbits: No acute finding. Other: None IMPRESSION: 1. No acute intracranial hemorrhage. 2. Age-related atrophy and chronic microvascular ischemic changes. 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 Electronically Signed   By: Elgie Collard M.D.   On: 01/21/2019 16:22   Chest Portable 1 View  Result Date: 02/13/2019 CLINICAL DATA:  Preop chest x-ray EXAM: PORTABLE CHEST 1 VIEW COMPARISON:  07/28/2018 FINDINGS: There is mild bilateral interstitial thickening likely chronic. There is elevation of the left diaphragm. There is left basilar airspace disease likely reflecting atelectasis versus less likely pneumonia. There is no pleural effusion or pneumothorax. The heart mediastinum are stable. There is no acute osseous abnormality. IMPRESSION: 1. Left basilar airspace disease likely reflecting atelectasis versus less likely pneumonia. Otherwise, no acute cardiopulmonary disease. Electronically Signed   By: Elige Ko   On: 02/12/2019 18:10   ECHOCARDIOGRAM COMPLETE  Result Date: 03-07-19   ECHOCARDIOGRAM REPORT   Patient Name:   JAHNI NAZAR Domingo Date of Exam: 2019-03-07 Medical Rec #:  [id number]      Height: Accession #:    [id number]     Weight: Date of Birth:  [birth year]      BSA: Patient Age:    85 years       BP:           130/75 mmHg Patient Gender: M              HR:           79 bpm. Exam Location:  Inpatient Procedure: 2D Echo, Cardiac Doppler and Color Doppler Indications:    Z01.818 Encounter for other preprocedural  examination; R55                 Syncope  History:        Patient has prior history of Echocardiogram examinations, most                 recent 08/18/2017. CAD, Signs/Symptoms:Altered Mental Status and                 Alzheimer's; Risk Factors:Diabetes, Hypertension and                 Dyslipidemia.  Sonographer:    Sheralyn Boatman RDCS Referring Phys: [id number] [facility] PATEL  Sonographer Comments: Technically difficult study due to poor echo windows. Image acquisition challenging due to respiratory motion. Patient could not follow directions or turn. Patient was moaning, coughing, and audible lung craclkles made the study difficult. IMPRESSIONS  1. Left ventricular ejection fraction, by visual estimation, is 50 to 55%. The left ventricle has low normal function. There is mildly increased left ventricular hypertrophy.  2. Abnormal septal motion consistent with left bundle branch block.  3. Left ventricular diastolic parameters are consistent with Grade I diastolic dysfunction (impaired relaxation).  4. The left ventricle has no regional wall motion abnormalities.  5.  Global right ventricle has normal systolic function.The right ventricular size is normal. No increase in right ventricular wall thickness.  6. Left atrial size was normal.  7. Right atrial size was normal.  8. Presence of pericardial fat pad.  9. Trivial pericardial effusion is present. 10. Mild mitral annular calcification. 11. The mitral valve is degenerative. Mild mitral valve regurgitation. 12. The tricuspid valve is grossly normal. 13. The aortic valve is tricuspid. Aortic valve regurgitation is not visualized. Mild aortic valve stenosis. 14. There is severe calcifcation of the aortic valve. 15. There is severe thickening of the aortic valve. 16. The pulmonic valve was grossly normal. Pulmonic valve regurgitation is not visualized. 17. Mildly elevated pulmonary artery systolic pressure. 18. The tricuspid regurgitant velocity is 3.02 m/s, and with an assumed  right atrial pressure of 8 mmHg, the estimated right ventricular systolic pressure is mildly elevated at 44.5 mmHg. 19. The inferior vena cava is dilated in size with >50% respiratory variability, suggesting right atrial pressure of 8 mmHg. 20. A prior study was performed on 08/18/2017. 21. No significant change from prior study. FINDINGS  Left Ventricle: Left ventricular ejection fraction, by visual estimation, is 50 to 55%. The left ventricle has low normal function. The left ventricle has no regional wall motion abnormalities. The left ventricular internal cavity size was the left ventricle is normal in size. There is mildly increased left ventricular hypertrophy. Abnormal (paradoxical) septal motion, consistent with left bundle branch block. Left ventricular diastolic parameters are consistent with Grade I diastolic dysfunction (impaired relaxation). Normal left atrial pressure. Right Ventricle: The right ventricular size is normal. No increase in right ventricular wall thickness. Global RV systolic function is has normal systolic function. The tricuspid regurgitant velocity is 3.02 m/s, and with an assumed right atrial pressure  of 8 mmHg, the estimated right ventricular systolic pressure is mildly elevated at 44.5 mmHg. Left Atrium: Left atrial size was normal in size. Right Atrium: Right atrial size was normal in size Pericardium: Trivial pericardial effusion is present. Presence of pericardial fat pad. Mitral Valve: The mitral valve is degenerative in appearance. Mild mitral annular calcification. Mild mitral valve regurgitation. Tricuspid Valve: The tricuspid valve is grossly normal. Tricuspid valve regurgitation is trivial. Aortic Valve: The aortic valve is tricuspid. . There is severe thickening and severe calcifcation of the aortic valve. Aortic valve regurgitation is not visualized. Mild aortic stenosis is present. There is severe thickening of the aortic valve. There is  severe calcifcation of the aortic  valve. Aortic valve mean gradient measures 5.4 mmHg. Aortic valve peak gradient measures 10.5 mmHg. Aortic valve area, by VTI measures 1.38 cm. Pulmonic Valve: The pulmonic valve was grossly normal. Pulmonic valve regurgitation is not visualized. Pulmonic regurgitation is not visualized. Aorta: The aortic root and ascending aorta are structurally normal, with no evidence of dilitation. Venous: The inferior vena cava is dilated in size with greater than 50% respiratory variability, suggesting right atrial pressure of 8 mmHg. IAS/Shunts: No atrial level shunt detected by color flow Doppler. Additional Comments: A prior study was performed on 08/18/2017.  LEFT VENTRICLE PLAX 2D LVIDd:         3.50 cm LVIDs:         2.70 cm LV PW:         1.10 cm LV IVS:        1.30 cm LVOT diam:     1.80 cm LV SV:         24 ml LVOT Area:  2.54 cm  RIGHT VENTRICLE RV S prime:     10.00 cm/s LEFT ATRIUM LA Vol (A2C): 26.7 ml LA Vol (A4C): 21.2 ml  AORTIC VALVE AV Area (Vmax):    1.73 cm AV Area (Vmean):   1.69 cm AV Area (VTI):     1.38 cm AV Vmax:           162.34 cm/s AV Vmean:          107.278 cm/s AV VTI:            0.291 m AV Peak Grad:      10.5 mmHg AV Mean Grad:      5.4 mmHg LVOT Vmax:         110.18 cm/s LVOT Vmean:        71.284 cm/s LVOT VTI:          0.158 m LVOT/AV VTI ratio: 0.54  AORTA Ao Root diam: 3.50 cm Ao Asc diam:  3.00 cm MV E velocity: 74.00 cm/s  103 cm/s  TRICUSPID VALVE MV A velocity: 130.00 cm/s 70.3 cm/s TR Peak grad:   36.5 mmHg MV E/A ratio:  0.57        1.5       TR Vmax:        301.88 cm/s                                       SHUNTS                                      Systemic VTI:  0.16 m                                      Systemic Diam: 1.80 cm  Lennie Odor MD Electronically signed by Lennie Odor MD Signature Date/Time: 02/18/2019/10:45:42 AM    Final         Scheduled Meds:  amLODipine  2.5 mg Oral QPM   ferrous sulfate  325 mg Oral Q M,W,F   mouth rinse  15 mL Mouth Rinse  BID   metoprolol tartrate  25 mg Oral BID   pantoprazole  40 mg Oral Daily   senna  1 tablet Oral BID   sodium chloride flush  3 mL Intravenous Once   Continuous Infusions:  dextrose 5% lactated ringers 75 mL/hr at 02/18/2019 0600     LOS: 1 day    Time spent: 39 minutes spent on chart review, discussion with nursing staff, consultants, updating family and interview/physical exam; more than 50% of that time was spent in counseling and/or coordination of care.    Alvira Philips [place], DO [facility] 01/21/2019, 10:57 AM

## 2019-02-17 NOTE — Interval H&P Note (Signed)
History and Physical Interval Note:  01/23/2019 4:27 PM  [name]  has presented today for surgery, with the diagnosis of left hip fracture.  The various methods of treatment have been discussed with the patient and family. After consideration of risks, benefits and other options for treatment, the patient has consented to  Procedure(s): INTRAMEDULLARY (IM) NAIL FEMORAL LEFT (Left) as a surgical intervention.  The patient's history has been reviewed, patient examined, no change in status, stable for surgery.  I have reviewed the patient's chart and labs.  Questions were answered to the patient's satisfaction.     Marybelle Killings

## 2019-02-17 NOTE — TOC Progression Note (Signed)
Transition of Care [facility]) - Progression Note    Patient Details  Name: [name] MRN: [id number] Date of Birth: Nov 10, 1922  [facility] of [facility]) CM/SW Contact  Purcell Mouton, RN Phone Number: 02/11/2019, 3:41 PM  Clinical Narrative:    Pt is from [facility] and can go to [facility] at admission coordinator.         Expected Discharge Plan and Services                                                 Social Determinants of Health (SDOH) Interventions    Readmission Risk Interventions No flowsheet data found.

## 2019-02-17 NOTE — Anesthesia Preprocedure Evaluation (Addendum)
Anesthesia Evaluation  Patient identified by MRN, date of birth, ID band Patient awake    Reviewed: Allergy & Precautions, NPO status , Patient's Chart, lab work & pertinent test results  Airway Mallampati: II  TM Distance: >3 FB     Dental   Pulmonary former smoker,    breath sounds clear to auscultation       Cardiovascular hypertension, + angina + CAD   Rhythm:Regular Rate:Normal     Neuro/Psych    GI/Hepatic GERD  ,  Endo/Other  diabetes  Renal/GU Renal disease     Musculoskeletal   Abdominal   Peds  Hematology  (+) Blood dyscrasia, ,   Anesthesia Other Findings   Reproductive/Obstetrics                           Anesthesia Physical Anesthesia Plan  ASA: III  Anesthesia Plan: General   Post-op Pain Management:    Induction: Intravenous  PONV Risk Score and Plan: 2 and Ondansetron  Airway Management Planned: Oral ETT  Additional Equipment:   Intra-op Plan:   Post-operative Plan: Possible Post-op intubation/ventilation  Informed Consent: I have reviewed the patients History and Physical, chart, labs and discussed the procedure including the risks, benefits and alternatives for the proposed anesthesia with the patient or authorized representative who has indicated his/her understanding and acceptance.     Dental advisory given  Plan Discussed with: CRNA and Anesthesiologist  Anesthesia Plan Comments:        Anesthesia Quick Evaluation

## 2019-02-17 NOTE — Transfer of Care (Signed)
Immediate Anesthesia Transfer of Care Note  Patient: [name]  Procedure(s) Performed: INTRAMEDULLARY (IM) NAIL FEMORAL LEFT (Left )  Patient Location: PACU  Anesthesia Type:General  Level of Consciousness: awake, alert  and patient cooperative  Airway & Oxygen Therapy: Patient Spontanous Breathing and Patient connected to face mask oxygen  Post-op Assessment: Report given to RN and Post -op Vital signs reviewed and stable  Post vital signs: Reviewed and stable  Last Vitals:  Vitals Value Taken Time  BP 148/91 02/12/2019 1855  Temp    Pulse 93 01/20/2019 1857  Resp 20 02/13/2019 1857  SpO2 97 % 01/19/2019 1857  Vitals shown include unvalidated device data.  Last Pain:  Vitals:   02/13/2019 1321  TempSrc: Oral         Complications: No apparent anesthesia complications

## 2019-02-17 NOTE — Anesthesia Postprocedure Evaluation (Signed)
Anesthesia Post Note  Patient: [name]  Procedure(s) Performed: INTRAMEDULLARY (IM) NAIL FEMORAL LEFT (Left )     Patient location during evaluation: PACU Anesthesia Type: General Level of consciousness: awake Pain management: pain level controlled Vital Signs Assessment: post-procedure vital signs reviewed and stable Respiratory status: spontaneous breathing Cardiovascular status: stable Postop Assessment: no apparent nausea or vomiting Anesthetic complications: no    Last Vitals:  Vitals:   02/15/2019 1846 02/05/2019 1856  BP:  (!) 148/91  Pulse: 88 91  Resp: (!) 23 (!) 21  Temp: (!) 36.4 C   SpO2: 100% 96%    Last Pain:  Vitals:   01/28/2019 1321  TempSrc: Oral                 Anani Gu

## 2019-02-17 NOTE — Progress Notes (Addendum)
Called by hospitalist with the patient who was admitted for pain.  Patient has dementia poor historian and a CT scan of the abdomen and pelvis was performed looking for diverticulitis or possible impaction.  This showed multiple lumbar compression fractures involving chronic L1-L2 and L5.  L3 compression fracture noted on MRI 07/03/2017 which is more severe and was new since 2018.  Chronic compression at T11-T12 was also noted.  No plain films of the hip was performed but CT scan did show comminuted inotrope fracture extended into the basilar neck with angulation and slight displacement with impaction.  Patient was admitted to the hospital service but no one notified me.  Will post for trochanteric nail and see patient later today.  We will try to reach family to discuss operative plan.  Patient had an echo ejection fraction 50 to 55%.  Troponin was elevated and patient will have to be cleared by cardiology before proceeding with surgery.  Full consult to follow later today. My cell [phone]

## 2019-02-18 ENCOUNTER — Encounter: Payer: Self-pay | Admitting: *Deleted

## 2019-02-18 LAB — CBC
HCT: 31.7 % — ABNORMAL LOW (ref 39.0–52.0)
Hemoglobin: 10.2 g/dL — ABNORMAL LOW (ref 13.0–17.0)
MCH: 30.2 pg (ref 26.0–34.0)
MCHC: 32.2 g/dL (ref 30.0–36.0)
MCV: 93.8 fL (ref 80.0–100.0)
Platelets: 131 10*3/uL — ABNORMAL LOW (ref 150–400)
RBC: 3.38 MIL/uL — ABNORMAL LOW (ref 4.22–5.81)
RDW: 14.2 % (ref 11.5–15.5)
WBC: 11 10*3/uL — ABNORMAL HIGH (ref 4.0–10.5)
nRBC: 0 % (ref 0.0–0.2)

## 2019-02-18 LAB — BASIC METABOLIC PANEL
Anion gap: 9 (ref 5–15)
BUN: 28 mg/dL — ABNORMAL HIGH (ref 8–23)
CO2: 23 mmol/L (ref 22–32)
Calcium: 9.2 mg/dL (ref 8.9–10.3)
Chloride: 106 mmol/L (ref 98–111)
Creatinine, Ser: 1.87 mg/dL — ABNORMAL HIGH (ref 0.61–1.24)
GFR calc Af Amer: 35 mL/min — ABNORMAL LOW (ref >60)
GFR calc non Af Amer: 30 mL/min — ABNORMAL LOW (ref >60)
Glucose, Bld: 194 mg/dL — ABNORMAL HIGH (ref 70–99)
Potassium: 4.7 mmol/L (ref 3.5–5.1)
Sodium: 138 mmol/L (ref 135–145)

## 2019-02-18 LAB — MAGNESIUM: Magnesium: 1.8 mg/dL (ref 1.7–2.4)

## 2019-02-18 NOTE — Evaluation (Signed)
Physical Therapy Evaluation Patient Details Name: [name] MRN: [id number] DOB: 16-May-1922 Today's Date: 02/18/2019   History of Present Illness  85 y.o. male with medical history of dementia, CAD, type 2 DM, HTN, and GERD. Patient presented to the ED with confusion and not acting himself for several days. CT revealed comminuted fracture of the proximal left femur, s/p IM nail  Clinical Impression  Pt admitted with above diagnosis.  Limited eval d/t pt agitation, resistant to imposed movement. Pt was able to briefly sit EOB unsupported,  however required +2 total assist for supine<>sit.    Pt currently with functional limitations due to the deficits listed below (see PT Problem List). Pt will benefit from skilled PT to increase their independence and safety with mobility to allow discharge to the venue listed below.       Follow Up Recommendations SNF;Supervision/Assistance - 24 hour    Equipment Recommendations  None recommended by PT    Recommendations for Other Services       Precautions / Restrictions Precautions Precautions: Fall Restrictions Weight Bearing Restrictions: No Other Position/Activity Restrictions: WBAT per op note per Dr. Ophelia Charter, orders state 50% PWB      Mobility  Bed Mobility Overal bed mobility: Needs Assistance Bed Mobility: Supine to Sit;Sit to Supine     Supine to sit: +2 for physical assistance;Total assist Sit to supine: +2 for physical assistance;Total assist   General bed mobility comments: requiring assist with trunk and LEs in both directions, pt agitated and swinging at PT and rehab tech while returning to supine  Transfers                 General transfer comment: attempted, pt unable to wt bear through LEs, deferred transfer, pt becoming more agitated  Ambulation/Gait                Stairs            Wheelchair Mobility    Modified Rankin (Stroke Patients Only)       Balance Overall balance assessment:  Needs assistance Sitting-balance support: No upper extremity supported;Feet supported Sitting balance-Leahy Scale: Fair Sitting balance - Comments: once assisted to EOB, pt able to maintain midline with close supervision to min assist Postural control: Posterior lean(intermittent)                                   Pertinent Vitals/Pain Pain Assessment: Faces Faces Pain Scale: Hurts little more Pain Descriptors / Indicators: Grimacing Pain Intervention(s): Monitored during session;Repositioned    Home Living Family/patient expects to be discharged to:: Skilled nursing facility                 Additional Comments: pt is unable to provide info, no family present (chart states pt is from [facility] with caregiver)    Prior Function Level of Independence: Independent               Hand Dominance        Extremity/Trunk Assessment   Upper Extremity Assessment Upper Extremity Assessment: (resitant to imposed movement, flexes elbows to 90 degrees bil  on command only, strong grip, RUE edematous)    Lower Extremity Assessment Lower Extremity Assessment: (resistant to imposed movement bil LEs)       Communication   Communication: HOH;Expressive difficulties(verbalizes)  Cognition Arousal/Alertness: Awake/alert Behavior During Therapy: Agitated Overall Cognitive Status: History of cognitive impairments - at baseline Area  of Impairment: Following commands                       Following Commands: Follows one step commands inconsistently       General Comments: baseline dementia      General Comments      Exercises     Assessment/Plan    PT Assessment Patient needs continued PT services  PT Problem List Decreased strength;Decreased activity tolerance;Decreased balance;Decreased cognition;Decreased safety awareness;Decreased mobility       PT Treatment Interventions Therapeutic exercise;Functional mobility training;Therapeutic  activities;Balance training    PT Goals (Current goals can be found in the Care Plan section)  Acute Rehab PT Goals PT Goal Formulation: Patient unable to participate in goal setting Time For Goal Achievement: 03/04/19 Potential to Achieve Goals: Good    Frequency Min 2X/week   Barriers to discharge        Co-evaluation               AM-PAC PT "6 Clicks" Mobility  Outcome Measure Help needed turning from your back to your side while in a flat bed without using bedrails?: Total Help needed moving from lying on your back to sitting on the side of a flat bed without using bedrails?: Total Help needed moving to and from a bed to a chair (including a wheelchair)?: Total Help needed standing up from a chair using your arms (e.g., wheelchair or bedside chair)?: Total Help needed to walk in hospital room?: Total Help needed climbing 3-5 steps with a railing? : Total 6 Click Score: 6    End of Session   Activity Tolerance: Treatment limited secondary to agitation Patient left: with call bell/phone within reach;in bed;with bed alarm set   PT Visit Diagnosis: Other abnormalities of gait and mobility (R26.89);Muscle weakness (generalized) (M62.81)    Time: 7262-0355 PT Time Calculation (min) (ACUTE ONLY): 16 min   Charges:   PT Evaluation $PT Eval Moderate Complexity: 1 Mod          Undrea Shipes, PT   Acute Rehab Dept [facility]): [phone]   02/18/2019   [facility] 02/18/2019, 1:53 PM

## 2019-02-18 NOTE — Progress Notes (Signed)
Initial Nutrition Assessment  RD working remotely.   DOCUMENTATION CODES:   Not applicable  INTERVENTION:  - diet advancement as medically feasible.    NUTRITION DIAGNOSIS:   Increased nutrient needs related to acute illness as evidenced by estimated needs.  GOAL:   Patient will meet greater than or equal to 90% of their needs  MONITOR:   Diet advancement, Labs, Weight trends, Skin  REASON FOR ASSESSMENT:   Malnutrition Screening Tool  ASSESSMENT:   85 y.o. male with medical history of dementia, CAD, type 2 DM, HTN, and GERD. Patient presented to the ED with confusion and not acting himself for several days.  Patient has been NPO since admission yesterday. Flow sheet documentation indicates that he is disoriented x4. Per chart review, current weight is 167 lb and weight has been stable for the past 11.5-12 months.   Per notes: - SLP assessed this AM--not yet appropriate for oral diet - POD #1 L hip surgery d/t L closed intertrochanteric hip fx following a fall at home   Labs reviewed; BUN: 28 mg/dl, creatinine: 1.87 mg/dl, GFR: 30 ml/min. Medications reviewed; 1 tablet senokot bid.  IVF; D5-LR @ 75 ml/hr (306 kcal).    NUTRITION - FOCUSED PHYSICAL EXAM:  unable to complete at this time.   Diet Order:   Diet Order            Diet NPO time specified  Diet effective now              EDUCATION NEEDS:   No education needs have been identified at this time  Skin:  Skin Assessment: Skin Integrity Issues: Skin Integrity Issues:: Incisions Incisions: L thigh (12/30)  Last BM:  12/30  Height:   Ht Readings from Last 1 Encounters:  03/13/2019 5\' 8"  (1.727 m)    Weight:   Wt Readings from Last 1 Encounters:  March 13, 2019 75.6 kg    Ideal Body Weight:  70 kg  BMI:  Body mass index is 25.34 kg/m.  Estimated Nutritional Needs:   Kcal:  1600-1800 kcal  Protein:  75-90 grams  Fluid:  >/= 1.8 L/day      Jarome Matin, MS, RD, LDN,  [facility] Inpatient Clinical Dietitian Pager # [phone] After hours/weekend pager # [phone]

## 2019-02-18 NOTE — Care Management Important Message (Signed)
Important Message  Patient Details IM Letter given to [place] Case Manager to present to the Patient Name: BECK COFER MRN: [id number] Date of Birth: 08-03-1922   Medicare Important Message Given:  Yes     Kerin Salen 02/18/2019, 12:30 PM

## 2019-02-18 NOTE — Progress Notes (Addendum)
Progress Note  Patient Name: [name] Date of Encounter: 02/18/2019  Primary Cardiologist: Remote-Dr. Royann Shivers, MD  Subjective   Resting this AM. Somnolent. Does not respond to questions regarding pain. No grimacing   Inpatient Medications    Scheduled Meds: . amLODipine  2.5 mg Oral QPM  . aspirin EC  325 mg Oral Q breakfast  . docusate sodium  100 mg Oral BID  . ferrous sulfate  325 mg Oral Q M,W,F  . mouth rinse  15 mL Mouth Rinse BID  . pantoprazole (PROTONIX) IV  40 mg Intravenous Q24H  . senna  1 tablet Oral BID  . sodium chloride flush  3 mL Intravenous Once   Continuous Infusions: . dextrose 5% lactated ringers 75 mL/hr at 02/18/2019 2012   PRN Meds: acetaminophen, bisacodyl, menthol-cetylpyridinium **OR** phenol, metoCLOPramide **OR** metoCLOPramide (REGLAN) injection, morphine injection, [DISCONTINUED] ondansetron **OR** ondansetron (ZOFRAN) IV, polyethylene glycol   Vital Signs    Vitals:   02/08/2019 2203 02/12/2019 2244 02/18/19 0119 02/18/19 0435  BP: 121/71 120/61 110/62 128/61  Pulse: 94 91 79 72  Resp: 19 20 18 18   Temp: 98.3 F (36.8 C) 98.6 F (37 C) (!) 97.4 F (36.3 C) (!) 97.3 F (36.3 C)  TempSrc: Axillary Oral Axillary Axillary  SpO2:  100% 100% 100%  Weight:      Height:        Intake/Output Summary (Last 24 hours) at 02/18/2019 0724 Last data filed at 02/18/2019 0458 Gross per 24 hour  Intake 2662.28 ml  Output 815 ml  Net 1847.28 ml   Filed Weights   01/24/2019 1721  Weight: 75.6 kg   Physical Exam   GEN: Elderly, in no acute distress.  Neck: Supple, no JVD Cardiac: RRR, no murmurs. Radials/DP 1+ and equal bilaterally.  Respiratory: Shallow breaths, regular and unlabored Skin: warm and dry Psych: Disoriented.   Labs    Chemistry Recent Labs  Lab 02/10/2019 1341 02/06/2019 0327 02/18/19 0354  NA 138 140 138  K 4.1 3.9 4.7  CL 108 107 106  CO2 21* 22 23  GLUCOSE 147* 151* 194*  BUN 33* 31* 28*  CREATININE  2.00* 1.89* 1.87*  CALCIUM 9.0 9.2 9.2  PROT 5.6*  --   --   ALBUMIN 3.1*  --   --   AST 19  --   --   ALT 14  --   --   ALKPHOS 48  --   --   BILITOT 1.7*  --   --   GFRNONAA 28* 30* 30*  GFRAA 32* 34* 35*  ANIONGAP 9 11 9      Hematology Recent Labs  Lab 02/12/2019 1341 02/14/2019 0327 02/18/19 0354  WBC 18.6* 15.6* 11.0*  RBC 3.81* 3.58* 3.38*  HGB 11.5* 10.9* 10.2*  HCT 35.9* 34.7* 31.7*  MCV 94.2 96.9 93.8  MCH 30.2 30.4 30.2  MCHC 32.0 31.4 32.2  RDW 14.0 14.1 14.2  PLT 177 157 131*    Cardiac EnzymesNo results for input(s): TROPONINI in the last 168 hours. No results for input(s): TROPIPOC in the last 168 hours.   BNPNo results for input(s): BNP, PROBNP in the last 168 hours.   DDimer No results for input(s): DDIMER in the last 168 hours.   Radiology    CT ABDOMEN PELVIS WO CONTRAST  Result Date: 01/24/2019 CLINICAL DATA:  Left-sided abdominal pain EXAM: CT ABDOMEN AND PELVIS WITHOUT CONTRAST TECHNIQUE: Multidetector CT imaging of the abdomen and pelvis was performed following  the standard protocol without IV contrast. COMPARISON:  03/07/2018 FINDINGS: Lower chest: Areas of chronic scarring are again noted in the lung bases bilaterally. Hepatobiliary: Single gallstone is noted within a decompressed gallbladder. The liver is within normal limits. Pancreas: Unremarkable. No pancreatic ductal dilatation or surrounding inflammatory changes. Spleen: Normal in size without focal abnormality. Adrenals/Urinary Tract: Mild adrenal hypertrophy is noted stable from the prior exam. Atrophic left kidney is noted with scattered calcifications. These are stable in appearance from the prior exam. The right kidney appears within normal limits. No obstructive changes are seen. The bladder is well distended. There are multiple calcifications identified in the distal right ureter which appears stable from the prior exam. These do not cause obstructive change. Stomach/Bowel: Mild diverticular  change of the colon is noted. Some prominent fecal material is noted within the rectum although no definitive impaction is seen. The appendix is not well visualized. No inflammatory changes are noted. The small bowel is within normal limits. Small sliding-type hiatal hernia is again noted. Vascular/Lymphatic: Aortic atherosclerosis. No enlarged abdominal or pelvic lymph nodes. Reproductive: Prostate is unremarkable. Other: No abdominal wall hernia or abnormality. No abdominopelvic ascites. Musculoskeletal: Right hip replacement is noted. There is a left intratrochanteric femoral fracture with mild impaction identified at the fracture site. This extends into the femoral neck. This is new from the prior exam. Multilevel compression deformities are again noted in the lumbar spine stable from the prior study. IMPRESSION: Comminuted fracture of the proximal left femur with impaction at the fracture site. The fracture involves the intratrochanteric region and extends into the level of the femoral neck. This is likely the etiology of the left-sided pain. Diverticulosis without diverticulitis. Distal right ureteral stones without obstructive change. These are stable in appearance from the prior exam. Cholelithiasis without complicating factors. Electronically Signed   By: Alcide CleverMark  Lukens M.D.   On: 06-Oct-2018 16:27   DG Chest 1 View  Result Date: 06-Oct-2018 CLINICAL DATA:  Increased confusion EXAM: CHEST  1 VIEW COMPARISON:  July 28, 2018. FINDINGS: No new consolidation or edema. No pleural effusion or pneumothorax. Cardiomediastinal contours are stable. IMPRESSION: No acute process in the chest. Electronically Signed   By: Guadlupe SpanishPraneil  Patel M.D.   On: 06-Oct-2018 15:34   CT Head Wo Contrast  Result Date: 06-Oct-2018 CLINICAL DATA:  85 year old male with altered mental status. EXAM: CT HEAD WITHOUT CONTRAST TECHNIQUE: Contiguous axial images were obtained from the base of the skull through the vertex without intravenous  contrast. COMPARISON:  Head CT dated 10/24/2018. FINDINGS: Brain: Moderate to advanced age-related atrophy and chronic microvascular ischemic changes. 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 There is no acute intracranial hemorrhage. No mass effect or midline shift. No extra-axial fluid collection. Vascular: No hyperdense vessel or unexpected calcification. Skull: Normal. Negative for fracture or focal lesion. Sinuses/Orbits: No acute finding. Other: None IMPRESSION: 1. No acute intracranial hemorrhage. 2. Age-related atrophy and chronic microvascular ischemic changes. 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 Electronically Signed   By: Elgie CollardArash  Radparvar M.D.   On: 06-Oct-2018 16:22   Chest Portable 1 View  Result Date: 06-Oct-2018 CLINICAL DATA:  Preop chest x-ray EXAM: PORTABLE CHEST 1 VIEW COMPARISON:  07/28/2018 FINDINGS: There is mild bilateral interstitial thickening likely chronic. There is elevation of the left diaphragm. There is left basilar airspace disease likely reflecting atelectasis versus less likely pneumonia. There is no pleural effusion or pneumothorax. The heart mediastinum are stable. There is no acute osseous abnormality. IMPRESSION: 1. Left basilar airspace disease likely  reflecting atelectasis versus less likely pneumonia. Otherwise, no acute cardiopulmonary disease. Electronically Signed   By: Elige Ko   On: 02/01/2019 18:10   DG C-Arm 1-60 Min-No Report  Result Date: 13-Mar-2019 Fluoroscopy was utilized by the requesting physician.  No radiographic interpretation.   ECHOCARDIOGRAM COMPLETE  Result Date: 03-13-2019   ECHOCARDIOGRAM REPORT   Patient Name:   TIERRE NETTO Freshour Date of Exam: Mar 13, 2019 Medical Rec #:  [id number]      Height: Accession #:    [id number]     Weight: Date of Birth:  1922/05/29      BSA: Patient Age:    89 years       BP:           130/75 mmHg Patient Gender: M              HR:           79 bpm. Exam Location:  Inpatient Procedure: 2D Echo,  Cardiac Doppler and Color Doppler Indications:    Z01.818 Encounter for other preprocedural examination; R55                 Syncope  History:        Patient has prior history of Echocardiogram examinations, most                 recent 08/18/2017. CAD, Signs/Symptoms:Altered Mental Status and                 Alzheimer's; Risk Factors:Diabetes, Hypertension and                 Dyslipidemia.  Sonographer:    Sheralyn Boatman RDCS Referring Phys: [id number] [facility] M PATEL  Sonographer Comments: Technically difficult study due to poor echo windows. Image acquisition challenging due to respiratory motion. Patient could not follow directions or turn. Patient was moaning, coughing, and audible lung craclkles made the study difficult. IMPRESSIONS  1. Left ventricular ejection fraction, by visual estimation, is 50 to 55%. The left ventricle has low normal function. There is mildly increased left ventricular hypertrophy.  2. Abnormal septal motion consistent with left bundle branch block.  3. Left ventricular diastolic parameters are consistent with Grade I diastolic dysfunction (impaired relaxation).  4. The left ventricle has no regional wall motion abnormalities.  5. Global right ventricle has normal systolic function.The right ventricular size is normal. No increase in right ventricular wall thickness.  6. Left atrial size was normal.  7. Right atrial size was normal.  8. Presence of pericardial fat pad.  9. Trivial pericardial effusion is present. 10. Mild mitral annular calcification. 11. The mitral valve is degenerative. Mild mitral valve regurgitation. 12. The tricuspid valve is grossly normal. 13. The aortic valve is tricuspid. Aortic valve regurgitation is not visualized. Mild aortic valve stenosis. 14. There is severe calcifcation of the aortic valve. 15. There is severe thickening of the aortic valve. 16. The pulmonic valve was grossly normal. Pulmonic valve regurgitation is not visualized. 17. Mildly elevated pulmonary  artery systolic pressure. 18. The tricuspid regurgitant velocity is 3.02 m/s, and with an assumed right atrial pressure of 8 mmHg, the estimated right ventricular systolic pressure is mildly elevated at 44.5 mmHg. 19. The inferior vena cava is dilated in size with >50% respiratory variability, suggesting right atrial pressure of 8 mmHg. 20. A prior study was performed on 08/18/2017. 21. No significant change from prior study. FINDINGS  Left Ventricle: Left ventricular ejection fraction, by visual estimation, is 50 to 55%.  The left ventricle has low normal function. The left ventricle has no regional wall motion abnormalities. The left ventricular internal cavity size was the left ventricle is normal in size. There is mildly increased left ventricular hypertrophy. Abnormal (paradoxical) septal motion, consistent with left bundle branch block. Left ventricular diastolic parameters are consistent with Grade I diastolic dysfunction (impaired relaxation). Normal left atrial pressure. Right Ventricle: The right ventricular size is normal. No increase in right ventricular wall thickness. Global RV systolic function is has normal systolic function. The tricuspid regurgitant velocity is 3.02 m/s, and with an assumed right atrial pressure  of 8 mmHg, the estimated right ventricular systolic pressure is mildly elevated at 44.5 mmHg. Left Atrium: Left atrial size was normal in size. Right Atrium: Right atrial size was normal in size Pericardium: Trivial pericardial effusion is present. Presence of pericardial fat pad. Mitral Valve: The mitral valve is degenerative in appearance. Mild mitral annular calcification. Mild mitral valve regurgitation. Tricuspid Valve: The tricuspid valve is grossly normal. Tricuspid valve regurgitation is trivial. Aortic Valve: The aortic valve is tricuspid. . There is severe thickening and severe calcifcation of the aortic valve. Aortic valve regurgitation is not visualized. Mild aortic stenosis is  present. There is severe thickening of the aortic valve. There is  severe calcifcation of the aortic valve. Aortic valve mean gradient measures 5.4 mmHg. Aortic valve peak gradient measures 10.5 mmHg. Aortic valve area, by VTI measures 1.38 cm. Pulmonic Valve: The pulmonic valve was grossly normal. Pulmonic valve regurgitation is not visualized. Pulmonic regurgitation is not visualized. Aorta: The aortic root and ascending aorta are structurally normal, with no evidence of dilitation. Venous: The inferior vena cava is dilated in size with greater than 50% respiratory variability, suggesting right atrial pressure of 8 mmHg. IAS/Shunts: No atrial level shunt detected by color flow Doppler. Additional Comments: A prior study was performed on 08/18/2017.  LEFT VENTRICLE PLAX 2D LVIDd:         3.50 cm LVIDs:         2.70 cm LV PW:         1.10 cm LV IVS:        1.30 cm LVOT diam:     1.80 cm LV SV:         24 ml LVOT Area:     2.54 cm  RIGHT VENTRICLE RV S prime:     10.00 cm/s LEFT ATRIUM LA Vol (A2C): 26.7 ml LA Vol (A4C): 21.2 ml  AORTIC VALVE AV Area (Vmax):    1.73 cm AV Area (Vmean):   1.69 cm AV Area (VTI):     1.38 cm AV Vmax:           162.34 cm/s AV Vmean:          107.278 cm/s AV VTI:            0.291 m AV Peak Grad:      10.5 mmHg AV Mean Grad:      5.4 mmHg LVOT Vmax:         110.18 cm/s LVOT Vmean:        71.284 cm/s LVOT VTI:          0.158 m LVOT/AV VTI ratio: 0.54  AORTA Ao Root diam: 3.50 cm Ao Asc diam:  3.00 cm MV E velocity: 74.00 cm/s  103 cm/s  TRICUSPID VALVE MV A velocity: 130.00 cm/s 70.3 cm/s TR Peak grad:   36.5 mmHg MV E/A ratio:  0.57  1.5       TR Vmax:        301.88 cm/s                                       SHUNTS                                      Systemic VTI:  0.16 m                                      Systemic Diam: 1.80 cm  Lennie Odor MD Electronically signed by Lennie Odor MD Signature Date/Time: 03-07-2019/10:45:42 AM    Final     Telemetry    02/18/2019 NSR  HR 70's  - Personally Reviewed  ECG    No new tracing as of 02/18/2019- Personally Reviewed  Cardiac Studies   LHC 10/26/14:  LAD: prox to mid 20%, mid 90%, dist 90% LCx: Mid 30%, OM2 ostial 95% RCA: prox 20%, mid 30%, R post AV branch 20% PCI: DES to OM2; DES to mid LAD and DES to dist LAD  2nd Mrg lesion, 95% stenosed. There is a 0% residual stenosis post intervention.  A drug-eluting stent was placed.  Dist LAD lesion, 90% stenosed. There is a 0% residual stenosis post intervention.  A drug-eluting stent was placed.  Mid LAD lesion, 90% stenosed. There is a 0% residual stenosis post intervention.  A drug-eluting stent was placed.  1. Unstable angina 2. Severe stenosis mid LAD, now s/p successful PTCA/DES x 2 mid LAD 3. Severe stenosis mid Circumflex, now s/p successful PTCA DES x 1 mid Circumflex.   Recommendations: He will need DAPT with ASA and Plavix for at least one year. Continue beta blocker and statin. Probable d/c home in am.     LHC 10/25/14:   Prox RCA lesion, 20% stenosed.  Mid RCA lesion, 30% stenosed.  Post Atrio lesion, 20% stenosed.  Mid LAD lesion, 90% stenosed.  Dist LAD lesion, 90% stenosed.  2nd Mrg lesion, 95% stenosed.  Mid Cx lesion, 30% stenosed.  Prox LAD to Mid LAD lesion, 20% stenosed.  1. Severe double vessel CAD with severe tandem stenoses mid LAD and severe stenosis OM2.  2. Unstable angina with high risk stress test.   Recommendations: Will hydrate aggressively tonight. Will plan staged PCI of the LAD and OM branch tomorrow afternoon if renal function stable. Plavix 600 mg given x 1 in the cath lab.   Carotid US 10/25/14 Bilateral - 1% to 39% iCA stenosis  Echo 10/22/14 EF 55-60%, normal wall motion, grade 1 diastolic dysfunction, trivial AI  Myoview 10/22/14 IMPRESSION: 1. Moderate size infarct of the anterior septal wall with mild peri-infarct ischemia. 2. Moderate sized region of moderate reversible  ischemia in the mid and basilar segments of the inferolateral wall. 2. Dyskinesia of the anterior septal wall. 3. Left ventricular ejection fraction 56% 4. High -risk stress test findings*.  Patient Profile     85 y.o. male with a hx of DM2, HTN, GERD, CKD stage III and CAD s/p  who is being seen today for pre-operative evaluation for hip fx repair at the request of Dr. Ophelia Charter.  Assessment & Plan    1.Preoperative assessment for  hip fracture repair: -Seen in consultation for pre-operative clearance prior to intertrochanteric hip fracture repair felt to be high risk given poor historian and unable to fully assess CV symptoms prior to surgery. He has not followed with cardiology in several years and it is unclear if he has had any cardiopulmonary symptoms preceding his admission. Fortunately his echocardiogram does not have any acute concerning changes and his ECG appears stable. His troponin elevation was felt to be secondary to being down after the fall versus demand ischemia.  No objective evidence for ACS.  -Surgery performed 2019/02/25  -Resting this AM, no complaints   2. CAD s/p PCI to OM/mLAD 2016: -S/p 2 vessel PCI with a synergy DES to be OM 2 and synergy DES 2 to the LAD 2016.   -Plavix stopped 2017>>continued on ASA  -Unable to assess for anginal symptoms -HsT likely in the setting of demand ischemia given stable LVEF on echo and no acute changes on EKG   3. HTN:  -Stable today, 128/61>110/62>120/61 -Continue amlodipine, beta blocker  4. Hyperlipidemia:  -Last LDL, 40 in 08/2017 -Continue statin  5. CKD stage III:  -Creatinine, 1.87 today with a baseline of 1.8-2.0 -Gentle hydration per primary team    Signed, Kathyrn Drown NP-C [facility] Pager: [phone] 02/18/2019, 7:24 AM     For questions or updates, please contact   Please consult [email or web address] for contact info under Cardiology/STEMI.  Patient seen and examined with APP.  Agree as above, with  the following exceptions and changes as noted below. No cardiovascular events related to surgery. Unable to endorse chest pain but overall hemodynamically stable. Gen: NAD, CV: RRR, no murmurs, Lungs: clear, Abd: soft, Extrem: no edema, Neuro/Psych: altered. All available labs, radiology testing, previous records reviewed. No evidence of cardiovascular compromise as a result of surgery and anesthesia. If in line with patient's goals of care he can seek cardiovascular follow up however this can be determined by patient's PCP.   We will sign off at this time.   Elouise Munroe 02/18/19 6:38 PM

## 2019-02-18 NOTE — Progress Notes (Signed)
  Speech Language Pathology Treatment: Dysphagia  Patient Details Name: [name] MRN: [id number] DOB: 01/31/1923 Today's Date: 02/18/2019 Time: 7616-0737 SLP Time Calculation (min) (ACUTE ONLY): 22 min  Assessment / Plan / Recommendation Clinical Impression  Patient seen to determine if he is appropriate to begin a diet and/or have a instrumental evaluation. Patient unable to follow simple commands with maximal cues. Oral care provided. Initial trial of applesauce was orally manipulated and a swallow initiated, however second attempt had to be suctioned, no swallow initiated. Tsp size and cup sip of water followed by an immediate cough response. His cough was ineffective to clear resulting in wet vocal quality. Maximum cues given for cough, however pt unable to produce a cough response. Pt has a reduce gag reflex to oral and throat suctioning. Spoke with RN about findings and recommended NPO status. Will check back tomorrow for improved mentation.    HPI HPI: [name] is a 85 y.o. male with Past medical history of dementia, CAD, type II DM, HTN, GERD. Patient presents with complaints of confusion. Patient was brought into the hospital by caregiver as the caregiver reported that the patient is significantly confused for last few days and not being himself.  No prior documented dysphagia hx.       SLP Plan  Continue with current plan of care(PO readiness)       Recommendations  Diet recommendations: NPO                Oral Care Recommendations: Oral care QID;Staff/trained caregiver to provide oral care Follow up Recommendations: Skilled Nursing facility SLP Visit Diagnosis: Dysphagia, unspecified (R13.10) Plan: Continue with current plan of care(PO readiness)       GO               Wynelle Bourgeois., MA, CCC-SLP 02/18/2019, 8:59 AM

## 2019-02-18 NOTE — Progress Notes (Signed)
Patient became more alert mid morning,  Gave ASA PO crushed with AS.  Patient did well, swallowed 2 bites without coughing.  Attempted to give colace and senna as well, but patient then began shaking his head no and pulling away from spoon, refusing.  Patient bathed - no expressions of pain noted.  Ice returned to L hip.  Patient fell back asleep and is resting comfortably.  Patient daughter Butch Penny called and updated.

## 2019-02-18 NOTE — Progress Notes (Signed)
OT Cancellation Note  Patient Details Name: [name] MRN: [id number] DOB: Jul 15, 1922   Cancelled Treatment:    Reason Eval/Treat Not Completed: OT screened, no needs identified, will sign off. Noted plan is snf.  Moving slowly with PT. Will defer OT intervention to next venue.  Faylynn Stamos 02/18/2019, 2:19 PM  Karsten Ro, OTR/L Acute Rehabilitation Services 02/18/2019

## 2019-02-18 NOTE — Progress Notes (Signed)
Alert to self only, bilateral mitts in place, pt still attempting to pull of tubes/cords with mitts on, states " I just wanna go home, where is my wife", attempts to reorient unsuccessful, left hip mepilex dressings x2 CDI, +csm bilaterally, condom cath in place, all foam and kerlix dressings to left arm, left shin, and top of head remain CDI, iv fluids infusing into right FA iv site, suctioned for small amount of white/yellow frothy sputum, cont pulse ox placed on right ear, sats 98-100% on RA, remains NPO, scd's on, legs elevated, remote in reach, bed alarm on, floor mat placed on right side of bed, will monitor throughout shift.

## 2019-02-18 NOTE — Progress Notes (Signed)
Subjective: Patient resting comfortably.  Did awaken briefly during my visit.  Confused.    Objective: Vital signs in last 24 hours: Temp:  [97.3 F (36.3 C)-99.2 F (37.3 C)] 98 F (36.7 C) (12/31 0751) Pulse Rate:  [69-96] 84 (12/31 0751) Resp:  [15-23] 20 (12/31 0751) BP: (110-149)/(42-98) 149/75 (12/31 0751) SpO2:  [95 %-100 %] 100 % (12/31 0751) Weight:  [75.6 kg] 75.6 kg (12/30 1721)  Intake/Output from previous day: 12/30 0701 - 12/31 0700 In: 2662.3 [I.V.:2312.3; IV Piggyback:350] Out: 815 [Urine:715; Blood:100] Intake/Output this shift: Total I/O In: 920.4 [I.V.:920.4] Out: -   Recent Labs    01/21/2019 1341 03-04-2019 0327 02/18/19 0354  HGB 11.5* 10.9* 10.2*   Recent Labs    March 04, 2019 0327 02/18/19 0354  WBC 15.6* 11.0*  RBC 3.58* 3.38*  HCT 34.7* 31.7*  PLT 157 131*   Recent Labs    03-04-2019 0327 02/18/19 0354  NA 140 138  K 3.9 4.7  CL 107 106  CO2 22 23  BUN 31* 28*  CREATININE 1.89* 1.87*  GLUCOSE 151* 194*  CALCIUM 9.2 9.2   Recent Labs    02/08/2019 1936  INR 1.1    Exam; Some bleeding through dressing left hip.     Assessment/Plan: RN to change dressing.  Continue present care.  D/c planning per medicine team.      Benjiman Core 02/18/2019, 12:26 PM

## 2019-02-18 NOTE — Progress Notes (Signed)
PROGRESS NOTE    [name]  XBJ:[id number] DOB: 1922/09/29 DOA: 2019/03/07 PCP: Merlene Laughter, MD    Brief Narrative:  [name] is a 85 y.o. male with Past medical history of dementia, CAD, type II DM, HTN, GERD who presented from his independent living facility with progressive confusion. Patient was brought into the hospital by caregiver as the caregiver reported that the patient is significantly confused for last few days and not being himself.  At his baseline the patient is alert awake and oriented x2 and pleasantly confused not agitated.  Reportedly per EDP's discussion with Family patient has progressive cognitive decline for last 2 weeks.  Over last 2 days patient has been refusing to bear any weight on his left leg he is also more fatigue and tired. No nausea no vomiting.  No fever no chills.  No chest pain abdominal pain.  At the time of my evaluation patient was in restraints with bilateral mittens and was trying to pull the mittens off.  Patient was not communicating verbally anything.  Not able to meaningfully follow any commands.  Consistently coughing.  ED Course: Presents with confusion and abdominal pain.  CT abdomen is positive for left-sided femur fracture.  Patient was referred for admission for further work-up and treatment.  At his baseline ambulates without assistance dependent for most of his ADL; although does not manages his medication on his own.   Assessment & Plan:   Principal Problem:   Hip fracture due to osteoporosis [facility]) Active Problems:   Essential hypertension   Chronic renal disease, stage III   GERD (gastroesophageal reflux disease)   CAD -S/P LAD and CFX DES 10/26/14   Dyslipidemia   Acute metabolic encephalopathy   Closed fracture of left hip (HCC)   Left hip fracture s/p ORIF IMN Patient presenting from his independent living facility with progressive confusion and inability to bear weight on his left lower extremity.  Also with  apparent unwitnessed fall.  CT abdomen/pelvis on admission showed a comminuted fracture proximal left femur.  --Orthopedics following, appreciate assistance; Dr. Ophelia Charter --s/p IMN on 12/30 by Dr. Ophelia Charter --Pain control with Norco 1-2 tablets q2h prn moderate pain, morphine 2 mg q2h severe pain --aspirin  PO daily for DVT prophylaxis per orthopedics  Unwitnessed fall Patient presents from independent living with apparent unwitnessed fall, timing uncertain.  Currently has 24/7-hour care at this facility.  Has had multiple abrasions noted on physical exam, which daughter verifies onset roughly 2 weeks ago.  Now with left hip fracture as above.  CT head without IV contrast with no acute findings with age-related atrophy and chronic microvascular changes and old left frontal infarct.  History of CAD status post DES in 2016.  Patient unable to further clarify due to his mental status. TTE with EF 50-55%, abnormal septal motion consistent with LBBB, grade 1 diastolic dysfunction, trivial pericardial effusion, mild aortic valve stenosis; with no significant change from previous echocardiogram in 2019. --Continue fall precautions --Continue to monitor on telemetry --Supportive care  Elevated troponin Hx CAD s/p DES 2016 Patient with history of CAD and underwent drug-eluting stent placement 2016 to LAD and marginal.  Troponin on presentation elevated at 186, 213.  Etiology likely demand ischemia from recent falls and underlying hip fracture.  EKG with NSR, rate 86, QTc 469 with left bundle branch block and no concerning ST elevation/depressions or T wave inversions.  TTE with EF 50-55%, grade 1 diastolic dysfunction which is unchanged from previous echocardiogram in  2019. --Cardiology consulted for further recommendations --Continue to monitor on telemetry  Dysphagia Question possible silent aspiration events, likely related to his advancing dementia. --Speech therapy following, continue n.p.o. for now;  plan on reevaluation tomorrow --Continue IV fluid hydration with D5 LR at 75 mL's per hour  Dementia with behavioral disturbances Acute metabolic encephalopathy Patient presenting from independent living with history of dementia, likely advancing fairly aggressively over the past 2 years per daughter's report. --Continue fall precautions --Supportive care  Leukocytosis:  WBC count elevated 18.6 on admission.  Urinalysis unrevealing.  Covid-19/SARS-CoV-2 negative.  Chest x-ray with left basilar airspace disease likely reflecting atelectasis rather than pneumonia.  Suspect leukocytosis reactive in nature from acute hip fracture versus dehydration. --WBC 18.6-->15.6-->11.0 --Continue IV fluid hydration as above --Continue monitor CBC count daily  Essential hypertension BP 130/75 this morning, will fairly well controlled. --Continue amlodipine 2.5 mg p.o. nightly, metoprolol tartrate 25 mg p.o. twice daily. --Holding home furosemide in which he takes 20 mg on Monday/Wednesday/Friday --Monitor blood pressure closely  Iron deficiency anemia: Hemoglobin 10.9 on admission. --Continue ferrous sulfate  on MWF --Monitor CBC daily in the setting of acute hip fracture  CKD stage IIIb Baseline creatinine last year 2.2-2.5 with a GFR of 22-25.  Creatinine on admission 2.00, improved to 1.89 which is better than his last known baseline. --Cr 1.87 today --Continue IV fluid hydration as above with D5 LR at 75 mL's per hour --Avoid nephrotoxins, renally dose all medications --Monitor renal function daily  GERD: Continue Protonix 40 mg p.o. daily  DVT prophylaxis: SCDs, aspirin per orthopedics Code Status: DNR Family Communication: Updated patient's daughter, Lupita Leash via telephone this afternoon Disposition Plan: Remain inpatient, pending PT/OT evaluation, will likely need upgrade in living situation likely SNF on discharge; from [facility] independent living facility   Consultants:    Orthopedics -Dr. Ophelia Charter  Cardiology  Procedures:   Transthoracic echocardiogram 01/28/2019: IMPRESSIONS    1. Left ventricular ejection fraction, by visual estimation, is 50 to 55%. The left ventricle has low normal function. There is mildly increased left ventricular hypertrophy.  2. Abnormal septal motion consistent with left bundle branch block.  3. Left ventricular diastolic parameters are consistent with Grade I diastolic dysfunction (impaired relaxation).  4. The left ventricle has no regional wall motion abnormalities.  5. Global right ventricle has normal systolic function.The right ventricular size is normal. No increase in right ventricular wall thickness.  6. Left atrial size was normal.  7. Right atrial size was normal.  8. Presence of pericardial fat pad.  9. Trivial pericardial effusion is present. 10. Mild mitral annular calcification. 11. The mitral valve is degenerative. Mild mitral valve regurgitation. 12. The tricuspid valve is grossly normal. 13. The aortic valve is tricuspid. Aortic valve regurgitation is not visualized. Mild aortic valve stenosis. 14. There is severe calcifcation of the aortic valve. 15. There is severe thickening of the aortic valve. 16. The pulmonic valve was grossly normal. Pulmonic valve regurgitation is not visualized. 17. Mildly elevated pulmonary artery systolic pressure. 18. The tricuspid regurgitant velocity is 3.02 m/s, and with an assumed right atrial pressure of 8 mmHg, the estimated right ventricular systolic pressure is mildly elevated at 44.5 mmHg. 19. The inferior vena cava is dilated in size with >50% respiratory variability, suggesting right atrial pressure of 8 mmHg. 20. A prior study was performed on 08/18/2017. 21. No significant change from prior study.  Antimicrobials:   Perioperative cefazolin   Subjective: Patient seen and examined at bedside, resting comfortably.  Continues to be somnolent/confused but arousable  with verbal command.  Continues with poor oral intake, but was able to take some of his medications this morning.  Underwent ORIF with IM nail yesterday by orthopedics.  Updated patient's daughter via telephone this afternoon.  Awaiting therapy evaluation.  No acute concerns overnight per nursing staff.  Objective: Vitals:   2019-03-03 2244 02/18/19 0119 02/18/19 0435 02/18/19 0751  BP: 120/61 110/62 128/61 (!) 149/75  Pulse: 91 79 72 84  Resp: 20 18 18 20   Temp: 98.6 F (37 C) (!) 97.4 F (36.3 C) (!) 97.3 F (36.3 C) 98 F (36.7 C)  TempSrc: Oral Axillary Axillary Axillary  SpO2: 100% 100% 100% 100%  Weight:      Height:        Intake/Output Summary (Last 24 hours) at 02/18/2019 1318 Last data filed at 02/18/2019 0751 Gross per 24 hour  Intake 3582.7 ml  Output 815 ml  Net 2767.7 ml   Filed Weights   03/03/2019 1721  Weight: 75.6 kg    Examination:  General exam: Calm, sleeping arousable to verbal command, nonverbal Respiratory system: Coarse breath sounds bilaterally, no wheezing, normal respiratory effort, on 3 L nasal cannula oxygenating 100% Cardiovascular system: S1 & S2 heard, RRR. No JVD, murmurs, rubs, gallops or clicks. No pedal edema. Gastrointestinal system: Abdomen is nondistended, soft and nontender. No organomegaly or masses felt. Normal bowel sounds heard. Central nervous system: Confused Extremities: Appears to move all extremities independently Skin: No rashes, lesions or ulcers Psychiatry: Unable to evaluate secondary to patient's current mental status    Data Reviewed: I have personally reviewed following labs and imaging studies  CBC: Recent Labs  Lab 02/03/2019 1341 03-Mar-2019 0327 02/18/19 0354  WBC 18.6* 15.6* 11.0*  NEUTROABS 15.7*  --   --   HGB 11.5* 10.9* 10.2*  HCT 35.9* 34.7* 31.7*  MCV 94.2 96.9 93.8  PLT 177 157 481*   Basic Metabolic Panel: Recent Labs  Lab 01/28/2019 1341 2019/03/03 0327 02/18/19 0354  NA 138 140 138  K 4.1 3.9  4.7  CL 108 107 106  CO2 21* 22 23  GLUCOSE 147* 151* 194*  BUN 33* 31* 28*  CREATININE 2.00* 1.89* 1.87*  CALCIUM 9.0 9.2 9.2  MG  --   --  1.8   GFR: Estimated Creatinine Clearance: 23.4 mL/min (A) (by C-G formula based on SCr of 1.87 mg/dL (H)). Liver Function Tests: Recent Labs  Lab 02/02/2019 1341  AST 19  ALT 14  ALKPHOS 48  BILITOT 1.7*  PROT 5.6*  ALBUMIN 3.1*   No results for input(s): LIPASE, AMYLASE in the last 168 hours. No results for input(s): AMMONIA in the last 168 hours. Coagulation Profile: Recent Labs  Lab 02/03/2019 1936  INR 1.1   Cardiac Enzymes: No results for input(s): CKTOTAL, CKMB, CKMBINDEX, TROPONINI in the last 168 hours. BNP (last 3 results) No results for input(s): PROBNP in the last 8760 hours. HbA1C: No results for input(s): HGBA1C in the last 72 hours. CBG: No results for input(s): GLUCAP in the last 168 hours. Lipid Profile: No results for input(s): CHOL, HDL, LDLCALC, TRIG, CHOLHDL, LDLDIRECT in the last 72 hours. Thyroid Function Tests: No results for input(s): TSH, T4TOTAL, FREET4, T3FREE, THYROIDAB in the last 72 hours. Anemia Panel: No results for input(s): VITAMINB12, FOLATE, FERRITIN, TIBC, IRON, RETICCTPCT in the last 72 hours. Sepsis Labs: No results for input(s): PROCALCITON, LATICACIDVEN in the last 168 hours.  Recent Results (from the past 240 hour(s))  SARS CORONAVIRUS 2 (TAT 6-24 HRS) Nasopharyngeal Nasopharyngeal Swab     Status: None   Collection Time: 01/22/2019  4:52 PM   Specimen: Nasopharyngeal Swab  Result Value Ref Range Status   SARS Coronavirus 2 NEGATIVE NEGATIVE Final    Comment: (NOTE) SARS-CoV-2 target nucleic acids are NOT DETECTED. The SARS-CoV-2 RNA is generally detectable in upper and lower respiratory specimens during the acute phase of infection. Negative results do not preclude SARS-CoV-2 infection, do not rule out co-infections with other pathogens, and should not be used as the sole basis for  treatment or other patient management decisions. Negative results must be combined with clinical observations, patient history, and epidemiological information. The expected result is Negative. Fact Sheet for Patients: HairSlick.no Fact Sheet for Healthcare Providers: quierodirigir.com This test is not yet approved or cleared by the [place] FDA and  has been authorized for detection and/or diagnosis of SARS-CoV-2 by FDA under an Emergency Use Authorization (EUA). This EUA will remain  in effect (meaning this test can be used) for the duration of the COVID-19 declaration under Section 56 4(b)(1) of the Act, 21 U.S.C. section 360bbb-3(b)(1), unless the authorization is terminated or revoked sooner. Performed at [facility], [address], [place], [place] [address]   Surgical pcr screen     Status: None   Collection Time: 03/03/2019  1:05 PM   Specimen: Nasal Mucosa; Nasal Swab  Result Value Ref Range Status   MRSA, PCR NEGATIVE NEGATIVE Final   Staphylococcus aureus NEGATIVE NEGATIVE Final    Comment: (NOTE) The Xpert SA Assay (FDA approved for NASAL specimens in patients 52 years of age and older), is one component of a comprehensive surveillance program. It is not intended to diagnose infection nor to guide or monitor treatment. Performed at [facility], [address], [place], [place] [address]          Radiology Studies: CT ABDOMEN PELVIS WO CONTRAST  Result Date: 02/15/2019 CLINICAL DATA:  Left-sided abdominal pain EXAM: CT ABDOMEN AND PELVIS WITHOUT CONTRAST TECHNIQUE: Multidetector CT imaging of the abdomen and pelvis was performed following the standard protocol without IV contrast. COMPARISON:  03/07/2018 FINDINGS: Lower chest: Areas of chronic scarring are again noted in the lung bases bilaterally. Hepatobiliary: Single gallstone is noted within a decompressed gallbladder. The  liver is within normal limits. Pancreas: Unremarkable. No pancreatic ductal dilatation or surrounding inflammatory changes. Spleen: Normal in size without focal abnormality. Adrenals/Urinary Tract: Mild adrenal hypertrophy is noted stable from the prior exam. Atrophic left kidney is noted with scattered calcifications. These are stable in appearance from the prior exam. The right kidney appears within normal limits. No obstructive changes are seen. The bladder is well distended. There are multiple calcifications identified in the distal right ureter which appears stable from the prior exam. These do not cause obstructive change. Stomach/Bowel: Mild diverticular change of the colon is noted. Some prominent fecal material is noted within the rectum although no definitive impaction is seen. The appendix is not well visualized. No inflammatory changes are noted. The small bowel is within normal limits. Small sliding-type hiatal hernia is again noted. Vascular/Lymphatic: Aortic atherosclerosis. No enlarged abdominal or pelvic lymph nodes. Reproductive: Prostate is unremarkable. Other: No abdominal wall hernia or abnormality. No abdominopelvic ascites. Musculoskeletal: Right hip replacement is noted. There is a left intratrochanteric femoral fracture with mild impaction identified at the fracture site. This extends into the femoral neck. This is new from the prior exam. Multilevel compression deformities  are again noted in the lumbar spine stable from the prior study. IMPRESSION: Comminuted fracture of the proximal left femur with impaction at the fracture site. The fracture involves the intratrochanteric region and extends into the level of the femoral neck. This is likely the etiology of the left-sided pain. Diverticulosis without diverticulitis. Distal right ureteral stones without obstructive change. These are stable in appearance from the prior exam. Cholelithiasis without complicating factors. Electronically Signed    By: Alcide Clever M.D.   On: 02/15/2019 16:27   DG Chest 1 View  Result Date: 02/14/2019 CLINICAL DATA:  Increased confusion EXAM: CHEST  1 VIEW COMPARISON:  July 28, 2018. FINDINGS: No new consolidation or edema. No pleural effusion or pneumothorax. Cardiomediastinal contours are stable. IMPRESSION: No acute process in the chest. Electronically Signed   By: Guadlupe Spanish M.D.   On: 01/26/2019 15:34   CT Head Wo Contrast  Result Date: 01/29/2019 CLINICAL DATA:  85 year old male with altered mental status. EXAM: CT HEAD WITHOUT CONTRAST TECHNIQUE: Contiguous axial images were obtained from the base of the skull through the vertex without intravenous contrast. COMPARISON:  Head CT dated 10/24/2018. FINDINGS: Brain: Moderate to advanced age-related atrophy and chronic microvascular ischemic changes. 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 There is no acute intracranial hemorrhage. No mass effect or midline shift. No extra-axial fluid collection. Vascular: No hyperdense vessel or unexpected calcification. Skull: Normal. Negative for fracture or focal lesion. Sinuses/Orbits: No acute finding. Other: None IMPRESSION: 1. No acute intracranial hemorrhage. 2. Age-related atrophy and chronic microvascular ischemic changes. 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 Electronically Signed   By: Elgie Collard M.D.   On: 02/01/2019 16:22   Chest Portable 1 View  Result Date: 02/15/2019 CLINICAL DATA:  Preop chest x-ray EXAM: PORTABLE CHEST 1 VIEW COMPARISON:  07/28/2018 FINDINGS: There is mild bilateral interstitial thickening likely chronic. There is elevation of the left diaphragm. There is left basilar airspace disease likely reflecting atelectasis versus less likely pneumonia. There is no pleural effusion or pneumothorax. The heart mediastinum are stable. There is no acute osseous abnormality. IMPRESSION: 1. Left basilar airspace disease likely reflecting atelectasis versus less likely pneumonia.  Otherwise, no acute cardiopulmonary disease. Electronically Signed   By: Elige Ko   On: 02/13/2019 18:10   DG C-Arm 1-60 Min-No Report  Result Date: 02/06/2019 Fluoroscopy was utilized by the requesting physician.  No radiographic interpretation.   ECHOCARDIOGRAM COMPLETE  Result Date: 02/05/2019   ECHOCARDIOGRAM REPORT   Patient Name:   ELLIAS MCELREATH Capell Date of Exam: 01/23/2019 Medical Rec #:  [id number]      Height: Accession #:    [id number]     Weight: Date of Birth:  01/19/1923      BSA: Patient Age:    89 years       BP:           130/75 mmHg Patient Gender: M              HR:           79 bpm. Exam Location:  Inpatient Procedure: 2D Echo, Cardiac Doppler and Color Doppler Indications:    Z01.818 Encounter for other preprocedural examination; R55                 Syncope  History:        Patient has prior history of Echocardiogram examinations, most                 recent 08/18/2017. CAD,  Signs/Symptoms:Altered Mental Status and                 Alzheimer's; Risk Factors:Diabetes, Hypertension and                 Dyslipidemia.  Sonographer:    Sheralyn Boatman RDCS Referring Phys: [id number] [facility] M PATEL  Sonographer Comments: Technically difficult study due to poor echo windows. Image acquisition challenging due to respiratory motion. Patient could not follow directions or turn. Patient was moaning, coughing, and audible lung craclkles made the study difficult. IMPRESSIONS  1. Left ventricular ejection fraction, by visual estimation, is 50 to 55%. The left ventricle has low normal function. There is mildly increased left ventricular hypertrophy.  2. Abnormal septal motion consistent with left bundle branch block.  3. Left ventricular diastolic parameters are consistent with Grade I diastolic dysfunction (impaired relaxation).  4. The left ventricle has no regional wall motion abnormalities.  5. Global right ventricle has normal systolic function.The right ventricular size is normal. No increase in right  ventricular wall thickness.  6. Left atrial size was normal.  7. Right atrial size was normal.  8. Presence of pericardial fat pad.  9. Trivial pericardial effusion is present. 10. Mild mitral annular calcification. 11. The mitral valve is degenerative. Mild mitral valve regurgitation. 12. The tricuspid valve is grossly normal. 13. The aortic valve is tricuspid. Aortic valve regurgitation is not visualized. Mild aortic valve stenosis. 14. There is severe calcifcation of the aortic valve. 15. There is severe thickening of the aortic valve. 16. The pulmonic valve was grossly normal. Pulmonic valve regurgitation is not visualized. 17. Mildly elevated pulmonary artery systolic pressure. 18. The tricuspid regurgitant velocity is 3.02 m/s, and with an assumed right atrial pressure of 8 mmHg, the estimated right ventricular systolic pressure is mildly elevated at 44.5 mmHg. 19. The inferior vena cava is dilated in size with >50% respiratory variability, suggesting right atrial pressure of 8 mmHg. 20. A prior study was performed on 08/18/2017. 21. No significant change from prior study. FINDINGS  Left Ventricle: Left ventricular ejection fraction, by visual estimation, is 50 to 55%. The left ventricle has low normal function. The left ventricle has no regional wall motion abnormalities. The left ventricular internal cavity size was the left ventricle is normal in size. There is mildly increased left ventricular hypertrophy. Abnormal (paradoxical) septal motion, consistent with left bundle branch block. Left ventricular diastolic parameters are consistent with Grade I diastolic dysfunction (impaired relaxation). Normal left atrial pressure. Right Ventricle: The right ventricular size is normal. No increase in right ventricular wall thickness. Global RV systolic function is has normal systolic function. The tricuspid regurgitant velocity is 3.02 m/s, and with an assumed right atrial pressure  of 8 mmHg, the estimated right  ventricular systolic pressure is mildly elevated at 44.5 mmHg. Left Atrium: Left atrial size was normal in size. Right Atrium: Right atrial size was normal in size Pericardium: Trivial pericardial effusion is present. Presence of pericardial fat pad. Mitral Valve: The mitral valve is degenerative in appearance. Mild mitral annular calcification. Mild mitral valve regurgitation. Tricuspid Valve: The tricuspid valve is grossly normal. Tricuspid valve regurgitation is trivial. Aortic Valve: The aortic valve is tricuspid. . There is severe thickening and severe calcifcation of the aortic valve. Aortic valve regurgitation is not visualized. Mild aortic stenosis is present. There is severe thickening of the aortic valve. There is  severe calcifcation of the aortic valve. Aortic valve mean gradient measures 5.4 mmHg. Aortic valve peak gradient  measures 10.5 mmHg. Aortic valve area, by VTI measures 1.38 cm. Pulmonic Valve: The pulmonic valve was grossly normal. Pulmonic valve regurgitation is not visualized. Pulmonic regurgitation is not visualized. Aorta: The aortic root and ascending aorta are structurally normal, with no evidence of dilitation. Venous: The inferior vena cava is dilated in size with greater than 50% respiratory variability, suggesting right atrial pressure of 8 mmHg. IAS/Shunts: No atrial level shunt detected by color flow Doppler. Additional Comments: A prior study was performed on 08/18/2017.  LEFT VENTRICLE PLAX 2D LVIDd:         3.50 cm LVIDs:         2.70 cm LV PW:         1.10 cm LV IVS:        1.30 cm LVOT diam:     1.80 cm LV SV:         24 ml LVOT Area:     2.54 cm  RIGHT VENTRICLE RV S prime:     10.00 cm/s LEFT ATRIUM LA Vol (A2C): 26.7 ml LA Vol (A4C): 21.2 ml  AORTIC VALVE AV Area (Vmax):    1.73 cm AV Area (Vmean):   1.69 cm AV Area (VTI):     1.38 cm AV Vmax:           162.34 cm/s AV Vmean:          107.278 cm/s AV VTI:            0.291 m AV Peak Grad:      10.5 mmHg AV Mean Grad:       5.4 mmHg LVOT Vmax:         110.18 cm/s LVOT Vmean:        71.284 cm/s LVOT VTI:          0.158 m LVOT/AV VTI ratio: 0.54  AORTA Ao Root diam: 3.50 cm Ao Asc diam:  3.00 cm MV E velocity: 74.00 cm/s  103 cm/s  TRICUSPID VALVE MV A velocity: 130.00 cm/s 70.3 cm/s TR Peak grad:   36.5 mmHg MV E/A ratio:  0.57        1.5       TR Vmax:        301.88 cm/s                                       SHUNTS                                      Systemic VTI:  0.16 m                                      Systemic Diam: 1.80 cm  Lennie OdorWesley O'Neal MD Electronically signed by Lennie OdorWesley O'Neal MD Signature Date/Time: 01/28/2019/10:45:42 AM    Final         Scheduled Meds: . amLODipine  2.5 mg Oral QPM  . aspirin EC  325 mg Oral Q breakfast  . docusate sodium  100 mg Oral BID  . ferrous sulfate  325 mg Oral Q M,W,F  . mouth rinse  15 mL Mouth Rinse BID  . pantoprazole (PROTONIX) IV  40 mg Intravenous Q24H  . senna  1 tablet Oral BID  .  sodium chloride flush  3 mL Intravenous Once   Continuous Infusions: . dextrose 5% lactated ringers 75 mL/hr at 02/18/19 0751     LOS: 2 days    Time spent: 36 minutes spent on chart review, discussion with nursing staff, consultants, updating family and interview/physical exam; more than 50% of that time was spent in counseling and/or coordination of care.    Alvira Philips [place], DO [facility] 02/18/2019, 1:18 PM

## 2019-02-19 DIAGNOSIS — S72002A Fracture of unspecified part of neck of left femur, initial encounter for closed fracture: Secondary | ICD-10-CM

## 2019-02-19 LAB — BASIC METABOLIC PANEL
Anion gap: 7 (ref 5–15)
BUN: 29 mg/dL — ABNORMAL HIGH (ref 8–23)
CO2: 23 mmol/L (ref 22–32)
Calcium: 9.2 mg/dL (ref 8.9–10.3)
Chloride: 109 mmol/L (ref 98–111)
Creatinine, Ser: 1.88 mg/dL — ABNORMAL HIGH (ref 0.61–1.24)
GFR calc Af Amer: 35 mL/min — ABNORMAL LOW (ref >60)
GFR calc non Af Amer: 30 mL/min — ABNORMAL LOW (ref >60)
Glucose, Bld: 156 mg/dL — ABNORMAL HIGH (ref 70–99)
Potassium: 4 mmol/L (ref 3.5–5.1)
Sodium: 139 mmol/L (ref 135–145)

## 2019-02-19 LAB — CBC
HCT: 29.6 % — ABNORMAL LOW (ref 39.0–52.0)
Hemoglobin: 9.3 g/dL — ABNORMAL LOW (ref 13.0–17.0)
MCH: 30.3 pg (ref 26.0–34.0)
MCHC: 31.4 g/dL (ref 30.0–36.0)
MCV: 96.4 fL (ref 80.0–100.0)
Platelets: 148 10*3/uL — ABNORMAL LOW (ref 150–400)
RBC: 3.07 MIL/uL — ABNORMAL LOW (ref 4.22–5.81)
RDW: 14.3 % (ref 11.5–15.5)
WBC: 10.8 10*3/uL — ABNORMAL HIGH (ref 4.0–10.5)
nRBC: 0 % (ref 0.0–0.2)

## 2019-02-19 MED ORDER — LABETALOL HCL 5 MG/ML IV SOLN: 10.0000 mg | Freq: Four times a day (QID) | INTRAVENOUS | Status: DC | PRN

## 2019-02-19 MED ORDER — SCOPOLAMINE 1 MG/3DAYS TD PT72
1.0000 | MEDICATED_PATCH | TRANSDERMAL | Status: DC
  Administered 2019-02-19: 13:00:00 1.5 mg via TRANSDERMAL
  Filled 2019-02-19: qty 1

## 2019-02-19 NOTE — Progress Notes (Signed)
RN called RT about patient having secretions in his throat . Patient does have some at times but not at every breath. RT will let RN know.

## 2019-02-19 NOTE — Progress Notes (Signed)
Assumed care of patient from previous RN. Agree with previous nurses assignment. Will continue to monitor.

## 2019-02-19 NOTE — Progress Notes (Signed)
PROGRESS NOTE    Mike JumboHerley G Harroun  ZOX:[id number] DOB: 1922-09-28 DOA: 17-Sep-2018 PCP: Merlene LaughterStoneking, Hal, MD    Brief Narrative:  [name] is a 86 y.o. male with Past medical history of dementia, CAD, type II DM, HTN, GERD who presented from his independent living facility with progressive confusion. Patient was brought into the hospital by caregiver as the caregiver reported that the patient is significantly confused for last few days and not being himself.  At his baseline the patient is alert awake and oriented x2 and pleasantly confused not agitated.  Reportedly per EDP's discussion with Family patient has progressive cognitive decline for last 2 weeks.  Over last 2 days patient has been refusing to bear any weight on his left leg he is also more fatigue and tired. No nausea no vomiting.  No fever no chills.  No chest pain abdominal pain.  At the time of my evaluation patient was in restraints with bilateral mittens and was trying to pull the mittens off.  Patient was not communicating verbally anything.  Not able to meaningfully follow any commands.  Consistently coughing.  ED Course: Presents with confusion and abdominal pain.  CT abdomen is positive for left-sided femur fracture.  Patient was referred for admission for further work-up and treatment.  At his baseline ambulates without assistance dependent for most of his ADL; although does not manages his medication on his own.   Assessment & Plan:   Principal Problem:   Hip fracture due to osteoporosis [facility](HCC) Active Problems:   Essential hypertension   Chronic renal disease, stage III   GERD (gastroesophageal reflux disease)   CAD -S/P LAD and CFX DES 10/26/14   Dyslipidemia   Acute metabolic encephalopathy   Closed fracture of left hip (HCC)   Left hip fracture s/p ORIF IMN Patient presenting from his independent living facility with progressive confusion and inability to bear weight on his left lower extremity.  Also with  apparent unwitnessed fall.  CT abdomen/pelvis on admission showed a comminuted fracture proximal left femur.  --Orthopedics following, appreciate assistance; Dr. Ophelia CharterYates --s/p IMN on 12/30 by Dr. Ophelia CharterYates --Pain control with Norco 1-2 tablets q2h prn moderate pain, morphine 2 mg q2h severe pain --aspirin 325mg  PO daily for DVT prophylaxis per orthopedics --He recommends SNF, case management for coordination  Unwitnessed fall Patient presents from independent living with apparent unwitnessed fall, timing uncertain.  Currently has 24/7-hour care at this facility.  Has had multiple abrasions noted on physical exam, which daughter verifies onset roughly 2 weeks ago.  Now with left hip fracture as above.  CT head without IV contrast with no acute findings with age-related atrophy and chronic microvascular changes and old left frontal infarct.  History of CAD status post DES in 2016.  Patient unable to further clarify due to his mental status. TTE with EF 50-55%, abnormal septal motion consistent with LBBB, grade 1 diastolic dysfunction, trivial pericardial effusion, mild aortic valve stenosis; with no significant change from previous echocardiogram in 2019. --Continue fall precautions --Continue to monitor on telemetry --Supportive care  Elevated troponin Hx CAD s/p DES 2016 Patient with history of CAD and underwent drug-eluting stent placement 2016 to LAD and marginal.  Troponin on presentation elevated at 186, 213.  Etiology likely demand ischemia from recent falls and underlying hip fracture.  EKG with NSR, rate 86, QTc 469 with left bundle branch block and no concerning ST elevation/depressions or T wave inversions.  TTE with EF 50-55%, grade 1 diastolic dysfunction  which is unchanged from previous echocardiogram in 2019. --Cardiology consulted for further recommendations --Continue to monitor on telemetry  Dysphagia Question possible silent aspiration events, likely related to his advancing dementia.   Continues with inability to swallow appropriately. --Speech therapy following, continue n.p.o. for now --Scopolamine patch for excess secretions --Continue IV fluid hydration with D5 LR at 75 mL's per hour --If no improvement over the next 24-48 hours, may need to consider hospice  Dementia with behavioral disturbances Acute metabolic encephalopathy Adult failure to thrive Patient presenting from independent living with history of dementia, likely advancing fairly aggressively over the past 2 years per daughter's report.  Following surgical invention, patient remains n.p.o. due to concerns of aspiration.  Continues to be significantly confused. --Updated patient's spouse, Gigi Gin regarding concerns of his continued failure to thrive and lack of oral intake --Continue fall precautions --Supportive care --Palliative care consultation for assistance with goals of care; if no improvement over the next 24 to 48 hours may need to consider hospice   Leukocytosis:  WBC count elevated 18.6 on admission.  Urinalysis unrevealing.  Covid-19/SARS-CoV-2 negative.  Chest x-ray with left basilar airspace disease likely reflecting atelectasis rather than pneumonia.  Suspect leukocytosis reactive in nature from acute hip fracture versus dehydration. --WBC 18.6-->15.6-->11.0-->10.8 --Continue IV fluid hydration as above --Continue monitor CBC count daily  Essential hypertension BP 119/70 this morning, will fairly well controlled.   --Discontinue home amlodipine, metoprolol titrate, furosemide given n.p.o. status --Labetalol 10 mg IV every 6 hours as needed SBP >165 or DBP >110 --Monitor blood pressure closely  Iron deficiency anemia: Hemoglobin 10.9 on admission. --Continue ferrous sulfate 325mg  on MWF --Monitor CBC daily in the setting of acute hip fracture  CKD stage IIIb Baseline creatinine last year 2.2-2.5 with a GFR of 22-25.  Creatinine on admission 2.00, improved to 1.89 which is better than his  last known baseline. --Cr 1.87-->1.88 today --Continue IV fluid hydration as above with D5 LR at 75 mL's per hour --Avoid nephrotoxins, renally dose all medications --Monitor renal function daily  GERD: Continue Protonix 40 mg p.o. daily  DVT prophylaxis: SCDs, aspirin per orthopedics Code Status: DNR Family Communication: Updated patient's spouse, Peggy via telephone this afternoon Disposition Plan: Remain inpatient, from independent living facility, PT recommends SNF; case management for coordination, pending palliative care consultation   Consultants:   Orthopedics -Dr. [employer]  Cardiology  Procedures:   Transthoracic echocardiogram 03/07/19: IMPRESSIONS    1. Left ventricular ejection fraction, by visual estimation, is 50 to 55%. The left ventricle has low normal function. There is mildly increased left ventricular hypertrophy.  2. Abnormal septal motion consistent with left bundle branch block.  3. Left ventricular diastolic parameters are consistent with Grade I diastolic dysfunction (impaired relaxation).  4. The left ventricle has no regional wall motion abnormalities.  5. Global right ventricle has normal systolic function.The right ventricular size is normal. No increase in right ventricular wall thickness.  6. Left atrial size was normal.  7. Right atrial size was normal.  8. Presence of pericardial fat pad.  9. Trivial pericardial effusion is present. 10. Mild mitral annular calcification. 11. The mitral valve is degenerative. Mild mitral valve regurgitation. 12. The tricuspid valve is grossly normal. 13. The aortic valve is tricuspid. Aortic valve regurgitation is not visualized. Mild aortic valve stenosis. 14. There is severe calcifcation of the aortic valve. 15. There is severe thickening of the aortic valve. 16. The pulmonic valve was grossly normal. Pulmonic valve regurgitation is not  visualized. 17. Mildly elevated pulmonary artery systolic  pressure. 18. The tricuspid regurgitant velocity is 3.02 m/s, and with an assumed right atrial pressure of 8 mmHg, the estimated right ventricular systolic pressure is mildly elevated at 44.5 mmHg. 19. The inferior vena cava is dilated in size with >50% respiratory variability, suggesting right atrial pressure of 8 mmHg. 20. A prior study was performed on 08/18/2017. 21. No significant change from prior study.  Antimicrobials:   Perioperative cefazolin   Subjective: Patient seen and examined at bedside, resting comfortably.  Continues to be very somnolent and confused.  Unable to tolerate oral intake, as he is unable to swallow appropriately.  Updated patient's spouse, Gigi Gin via telephone regarding concerns of his dysphagia.  She reports that he has had multiple issues regarding this over the past several years requiring esophageal dilation in the past.  Also discussed with her concerns regarding his overall mental status as well as his inability to tolerate oral intake.  Ultimately very grim prognosis if patient does not respond much over the next 24-48 hours and may need to consider hospice care. No other acute concerns overnight per nursing staff.  Objective: Vitals:   02/18/19 1330 02/18/19 1733 02/18/19 2133 02/19/19 0608  BP: 134/74 (!) 145/80 131/70 119/70  Pulse: 88 95 92 90  Resp: 20 20 20 18   Temp: 97.8 F (36.6 C) (!) 97.4 F (36.3 C) 97.8 F (36.6 C) 99.1 F (37.3 C)  TempSrc: Oral Oral Oral Oral  SpO2: 92% 94% 93% 96%  Weight:      Height:        Intake/Output Summary (Last 24 hours) at 02/19/2019 1050 Last data filed at 02/19/2019 0527 Gross per 24 hour  Intake 1619.49 ml  Output 875 ml  Net 744.49 ml   Filed Weights   02/01/2019 1721  Weight: 75.6 kg    Examination:  General exam: Calm, sleeping arousable to verbal command, nonverbal Respiratory system: Coarse breath sounds bilaterally, no wheezing, normal respiratory effort, on room air oxygenating  96%. Cardiovascular system: S1 & S2 heard, RRR. No JVD, murmurs, rubs, gallops or clicks. No pedal edema. Gastrointestinal system: Abdomen is nondistended, soft and nontender. No organomegaly or masses felt. Normal bowel sounds heard. Central nervous system: Confused Extremities: Appears to move all extremities independently, surgical dressing noted, clean/dry/intact Skin: No rashes, lesions or ulcers Psychiatry: Unable to evaluate secondary to patient's current mental status    Data Reviewed: I have personally reviewed following labs and imaging studies  CBC: Recent Labs  Lab 02/10/2019 1341 01/22/2019 0327 02/18/19 0354 02/19/19 0251  WBC 18.6* 15.6* 11.0* 10.8*  NEUTROABS 15.7*  --   --   --   HGB 11.5* 10.9* 10.2* 9.3*  HCT 35.9* 34.7* 31.7* 29.6*  MCV 94.2 96.9 93.8 96.4  PLT 177 157 131* 148*   Basic Metabolic Panel: Recent Labs  Lab 02/11/2019 1341 02/04/2019 0327 02/18/19 0354 02/19/19 0251  NA 138 140 138 139  K 4.1 3.9 4.7 4.0  CL 108 107 106 109  CO2 21* 22 23 23   GLUCOSE 147* 151* 194* 156*  BUN 33* 31* 28* 29*  CREATININE 2.00* 1.89* 1.87* 1.88*  CALCIUM 9.0 9.2 9.2 9.2  MG  --   --  1.8  --    GFR: Estimated Creatinine Clearance: 23.2 mL/min (A) (by C-G formula based on SCr of 1.88 mg/dL (H)). Liver Function Tests: Recent Labs  Lab 02/05/2019 1341  AST 19  ALT 14  ALKPHOS 48  BILITOT 1.7*  PROT 5.6*  ALBUMIN 3.1*   No results for input(s): LIPASE, AMYLASE in the last 168 hours. No results for input(s): AMMONIA in the last 168 hours. Coagulation Profile: Recent Labs  Lab 02/09/2019 1936  INR 1.1   Cardiac Enzymes: No results for input(s): CKTOTAL, CKMB, CKMBINDEX, TROPONINI in the last 168 hours. BNP (last 3 results) No results for input(s): PROBNP in the last 8760 hours. HbA1C: No results for input(s): HGBA1C in the last 72 hours. CBG: No results for input(s): GLUCAP in the last 168 hours. Lipid Profile: No results for input(s): CHOL, HDL,  LDLCALC, TRIG, CHOLHDL, LDLDIRECT in the last 72 hours. Thyroid Function Tests: No results for input(s): TSH, T4TOTAL, FREET4, T3FREE, THYROIDAB in the last 72 hours. Anemia Panel: No results for input(s): VITAMINB12, FOLATE, FERRITIN, TIBC, IRON, RETICCTPCT in the last 72 hours. Sepsis Labs: No results for input(s): PROCALCITON, LATICACIDVEN in the last 168 hours.  Recent Results (from the past 240 hour(s))  SARS CORONAVIRUS 2 (TAT 6-24 HRS) Nasopharyngeal Nasopharyngeal Swab     Status: None   Collection Time: 02/12/2019  4:52 PM   Specimen: Nasopharyngeal Swab  Result Value Ref Range Status   SARS Coronavirus 2 NEGATIVE NEGATIVE Final    Comment: (NOTE) SARS-CoV-2 target nucleic acids are NOT DETECTED. The SARS-CoV-2 RNA is generally detectable in upper and lower respiratory specimens during the acute phase of infection. Negative results do not preclude SARS-CoV-2 infection, do not rule out co-infections with other pathogens, and should not be used as the sole basis for treatment or other patient management decisions. Negative results must be combined with clinical observations, patient history, and epidemiological information. The expected result is Negative. Fact Sheet for Patients: SugarRoll.be Fact Sheet for Healthcare Providers: https://www.woods-mathews.com/ This test is not yet approved or cleared by the [place] FDA and  has been authorized for detection and/or diagnosis of SARS-CoV-2 by FDA under an Emergency Use Authorization (EUA). This EUA will remain  in effect (meaning this test can be used) for the duration of the COVID-19 declaration under Section 56 4(b)(1) of the Act, 21 U.S.C. section 360bbb-3(b)(1), unless the authorization is terminated or revoked sooner. Performed at [facility], [place] [address], [place], [place] [address]   Surgical pcr screen     Status: None   Collection Time: 02/03/2019  1:05 PM    Specimen: Nasal Mucosa; Nasal Swab  Result Value Ref Range Status   MRSA, PCR NEGATIVE NEGATIVE Final   Staphylococcus aureus NEGATIVE NEGATIVE Final    Comment: (NOTE) The Xpert SA Assay (FDA approved for NASAL specimens in patients 28 years of age and older), is one component of a comprehensive surveillance program. It is not intended to diagnose infection nor to guide or monitor treatment. Performed at [facility], [place] [address], [place] [address]          Radiology Studies: DG C-Arm 1-60 Min-No Report  Result Date: 01/20/2019 Fluoroscopy was utilized by the requesting physician.  No radiographic interpretation.        Scheduled Meds: . amLODipine  2.5 mg Oral QPM  . aspirin EC  325 mg Oral Q breakfast  . docusate sodium  100 mg Oral BID  . ferrous sulfate  325 mg Oral Q M,W,F  . mouth rinse  15 mL Mouth Rinse BID  . pantoprazole (PROTONIX) IV  40 mg Intravenous Q24H  . senna  1 tablet Oral BID  . sodium chloride flush  3 mL Intravenous Once  Continuous Infusions: . dextrose 5% lactated ringers 75 mL/hr at 02/19/19 0945     LOS: 3 days    Time spent: 36 minutes spent on chart review, discussion with nursing staff, consultants, updating family and interview/physical exam; more than 50% of that time was spent in counseling and/or coordination of care.    Alvira Philips [place], DO [facility] 02/19/2019, 10:50 AM

## 2019-02-19 NOTE — Plan of Care (Signed)

## 2019-02-19 NOTE — Progress Notes (Signed)
  Speech Language Pathology Treatment: Dysphagia  Patient Details Name: [name] MRN: [id number] DOB: October 11, 1922 Today's Date: 02/19/2019 Time: 4315-4008 SLP Time Calculation (min) (ACUTE ONLY): 16 min  Assessment / Plan / Recommendation Clinical Impression  Patient seen for diagnostic treatment including po readiness. Patient remains lethargic, able to arouse, but confused requiring cueing for follow commands. Vocal quality severely wet with congested upper airway noise during inhalation and exhalation, indicative of poor secretions management and weak cough. Patient unable to clear despite max cues for strong cough provided by SLP. Oral care initiated with removal of thick secretions along posterior tongue and soft palate. One ice chip provided to assess for ability to orally transit bolus and initiate a pharyngeal swallow. Oral transit timely and swallow initiated however wet vocal quality persisted. Discussed with RN. Patient at a high aspiration risk at this time, even with secretions. Recommend strict NPO. Noted that patient was being provided meds crushed in puree despite NPO recommendations. Would strongly advise against this. RN informed. Will f/u. Suspect that we will see improved ability to protect the airway coinciding with improved mentation.    HPI HPI: [name] is a 86 y.o. male with Past medical history of dementia, CAD, type II DM, HTN, GERD. Patient presents with complaints of confusion. Patient was brought into the hospital by caregiver as the caregiver reported that the patient is significantly confused for last few days and not being himself.  No prior documented dysphagia hx.       SLP Plan  Continue with current plan of care       Recommendations  Diet recommendations: NPO Medication Administration: Via alternative means                Oral Care Recommendations: Oral care QID Follow up Recommendations: Skilled Nursing facility SLP Visit Diagnosis:  Dysphagia, pharyngeal phase (R13.13) Plan: Continue with current plan of care       GO             Esaias Cleavenger MA, CCC-SLP     Chaka Boyson Meryl 02/19/2019, 8:48 AM

## 2019-02-19 NOTE — Progress Notes (Signed)
Patient ID: [name], male   DOB: Oct 29, 1922, 86 y.o.   MRN: [id number] Patient asleep appears comfortable.  Left hip dressing clean dry and intact. POD#2 s/p left femoral nail. Left leg WBAT.

## 2019-02-19 DEATH — deceased

## 2019-02-20 ENCOUNTER — Inpatient Hospital Stay (HOSPITAL_COMMUNITY): Payer: Medicare Other

## 2019-02-20 LAB — URINALYSIS, ROUTINE W REFLEX MICROSCOPIC
Bacteria, UA: NONE SEEN
Bilirubin Urine: NEGATIVE
Glucose, UA: 50 mg/dL — AB
Ketones, ur: NEGATIVE mg/dL
Leukocytes,Ua: NEGATIVE
Nitrite: NEGATIVE
Protein, ur: 30 mg/dL — AB
Specific Gravity, Urine: 1.018 (ref 1.005–1.030)
pH: 5 (ref 5.0–8.0)

## 2019-02-20 LAB — BASIC METABOLIC PANEL
Anion gap: 9 (ref 5–15)
BUN: 31 mg/dL — ABNORMAL HIGH (ref 8–23)
CO2: 22 mmol/L (ref 22–32)
Calcium: 8.9 mg/dL (ref 8.9–10.3)
Chloride: 108 mmol/L (ref 98–111)
Creatinine, Ser: 1.97 mg/dL — ABNORMAL HIGH (ref 0.61–1.24)
GFR calc Af Amer: 33 mL/min — ABNORMAL LOW (ref >60)
GFR calc non Af Amer: 28 mL/min — ABNORMAL LOW (ref >60)
Glucose, Bld: 159 mg/dL — ABNORMAL HIGH (ref 70–99)
Potassium: 3.7 mmol/L (ref 3.5–5.1)
Sodium: 139 mmol/L (ref 135–145)

## 2019-02-20 LAB — CBC
HCT: 27.4 % — ABNORMAL LOW (ref 39.0–52.0)
Hemoglobin: 8.4 g/dL — ABNORMAL LOW (ref 13.0–17.0)
MCH: 29.9 pg (ref 26.0–34.0)
MCHC: 30.7 g/dL (ref 30.0–36.0)
MCV: 97.5 fL (ref 80.0–100.0)
Platelets: 130 10*3/uL — ABNORMAL LOW (ref 150–400)
RBC: 2.81 MIL/uL — ABNORMAL LOW (ref 4.22–5.81)
RDW: 14.2 % (ref 11.5–15.5)
WBC: 6.5 10*3/uL (ref 4.0–10.5)
nRBC: 0 % (ref 0.0–0.2)

## 2019-02-20 MED ORDER — HALOPERIDOL LACTATE 5 MG/ML IJ SOLN: 0.5000 mg | INTRAMUSCULAR | Status: DC | PRN

## 2019-02-20 MED ORDER — GLYCOPYRROLATE 0.2 MG/ML IJ SOLN
0.2000 mg | INTRAMUSCULAR | Status: DC | PRN
  Filled 2019-02-20: qty 1

## 2019-02-20 MED ORDER — ACETAMINOPHEN 650 MG RE SUPP: 650.0000 mg | Freq: Four times a day (QID) | RECTAL | Status: DC | PRN

## 2019-02-20 MED ORDER — HALOPERIDOL 0.5 MG PO TABS
0.5000 mg | ORAL_TABLET | ORAL | Status: DC | PRN
  Filled 2019-02-20: qty 1

## 2019-02-20 MED ORDER — BIOTENE DRY MOUTH MT LIQD: 15.0000 mL | OROMUCOSAL | Status: DC | PRN

## 2019-02-20 MED ORDER — HALOPERIDOL LACTATE 2 MG/ML PO CONC
0.5000 mg | ORAL | Status: DC | PRN
  Filled 2019-02-20: qty 0.3

## 2019-02-20 MED ORDER — POLYVINYL ALCOHOL 1.4 % OP SOLN
1.0000 [drp] | Freq: Four times a day (QID) | OPHTHALMIC | Status: DC | PRN
  Filled 2019-02-20: qty 15

## 2019-02-20 MED ORDER — ONDANSETRON HCL 4 MG/2ML IJ SOLN: 4.0000 mg | Freq: Four times a day (QID) | INTRAMUSCULAR | Status: DC | PRN

## 2019-02-20 MED ORDER — GLYCOPYRROLATE 1 MG PO TABS
1.0000 mg | ORAL_TABLET | ORAL | Status: DC | PRN
  Filled 2019-02-20: qty 1

## 2019-02-20 MED ORDER — ONDANSETRON 4 MG PO TBDP: 4.0000 mg | ORAL_TABLET | Freq: Four times a day (QID) | ORAL | Status: DC | PRN

## 2019-02-20 MED ORDER — DIPHENHYDRAMINE HCL 50 MG/ML IJ SOLN: 12.5000 mg | INTRAMUSCULAR | Status: DC | PRN

## 2019-02-20 MED ORDER — ACETAMINOPHEN 650 MG RE SUPP
650.0000 mg | Freq: Once | RECTAL | Status: AC
  Administered 2019-02-20: 650 mg via RECTAL
  Filled 2019-02-20: qty 1

## 2019-02-20 MED ORDER — LIP MEDEX EX OINT
TOPICAL_OINTMENT | CUTANEOUS | Status: AC
  Filled 2019-02-20: qty 7

## 2019-02-20 MED ORDER — LORAZEPAM 2 MG/ML IJ SOLN: 1.0000 mg | INTRAMUSCULAR | Status: DC | PRN

## 2019-02-20 MED ORDER — FUROSEMIDE 10 MG/ML IJ SOLN
20.0000 mg | Freq: Once | INTRAMUSCULAR | Status: AC
  Administered 2019-02-20: 20 mg via INTRAVENOUS
  Filled 2019-02-20: qty 2

## 2019-02-20 MED ORDER — ACETAMINOPHEN 325 MG PO TABS: 650.0000 mg | ORAL_TABLET | Freq: Four times a day (QID) | ORAL | Status: DC | PRN

## 2019-02-20 MED ORDER — MORPHINE BOLUS VIA INFUSION
1.0000 mg | INTRAVENOUS | Status: DC | PRN
  Filled 2019-02-20: qty 1

## 2019-02-20 MED ORDER — LORAZEPAM 2 MG/ML PO CONC: 1.0000 mg | ORAL | Status: DC | PRN

## 2019-02-20 MED ORDER — MORPHINE 100MG IN NS 100ML (1MG/ML) PREMIX INFUSION
1.0000 mg/h | INTRAVENOUS | Status: DC
  Administered 2019-02-20: 1 mg/h via INTRAVENOUS
  Filled 2019-02-20: qty 100

## 2019-02-20 MED ORDER — LORAZEPAM 1 MG PO TABS: 1.0000 mg | ORAL_TABLET | ORAL | Status: DC | PRN

## 2019-02-20 NOTE — Progress Notes (Signed)
PROGRESS NOTE    EDWAR COE  JQB:[id number] DOB: 07/25/1922 DOA: 02/15/2019 PCP: Merlene Laughter, MD    Brief Narrative:  [name] is a 86 y.o. male with Past medical history of dementia, CAD, type II DM, HTN, GERD who presented from his independent living facility with progressive confusion. Patient was brought into the hospital by caregiver as the caregiver reported that the patient is significantly confused for last few days and not being himself.  At his baseline the patient is alert awake and oriented x2 and pleasantly confused not agitated. Reportedly per EDP's discussion with Family patient has progressive cognitive decline for last 2 weeks.  Over last 2 days patient has been refusing to bear any weight on his left leg he is also more fatigue and tired. In the ED, CT abdomen is positive for left-sided femur fracture.  Patient was referred for admission for further work-up and treatment.   Assessment & Plan:   Principal Problem:   Hip fracture due to osteoporosis [facility]) Active Problems:   Essential hypertension   Chronic renal disease, stage III   GERD (gastroesophageal reflux disease)   CAD -S/P LAD and CFX DES 10/26/14   Dyslipidemia   Acute metabolic encephalopathy   Closed fracture of left hip (HCC)   Left hip fracture s/p ORIF IMN on 01/23/2019 by Dr Ophelia Charter Unwitnessed fall CT abdomen/pelvis showed a comminuted fracture proximal left femur s/p repair as mentioned above Currently comfort care  Unwitnessed fall CT head without IV contrast with no acute findings with age-related atrophy and chronic microvascular changes and old left frontal infarct Comfort care  Elevated troponin Hx CAD s/p DES 2016 Troponin on presentation elevated at 186, 213 Etiology likely demand ischemia from recent falls TTE with EF 50-55%, grade 1 diastolic dysfunction  Dysphagia Question possible silent aspiration events, likely related to his advancing dementia.  Continues with inability  to swallow appropriately Scopolamine patch for excess secretions Comfort care  Dementia with behavioral disturbances Acute metabolic encephalopathy Adult failure to thrive Comfort care  Essential hypertension Comfort care  CKD stage IIIb Creatinine at baseline     DVT prophylaxis: None Code Status: DNR Family Communication: Discussed in details with wife Gigi Gin and daughter Lupita Leash, discussed extensively about patient's very poor prognosis, both wife and daughters are in agreement with switching over to comfort care Disposition Plan: Anticipate hospital death Consultants:   Orthopedics -Dr. Ophelia Charter  Cardiology  Procedures:   Transthoracic echocardiogram 01/29/2019: IMPRESSIONS    1. Left ventricular ejection fraction, by visual estimation, is 50 to 55%. The left ventricle has low normal function. There is mildly increased left ventricular hypertrophy.  2. Abnormal septal motion consistent with left bundle branch block.  3. Left ventricular diastolic parameters are consistent with Grade I diastolic dysfunction (impaired relaxation).  4. The left ventricle has no regional wall motion abnormalities.  5. Global right ventricle has normal systolic function.The right ventricular size is normal. No increase in right ventricular wall thickness.  6. Left atrial size was normal.  7. Right atrial size was normal.  8. Presence of pericardial fat pad.  9. Trivial pericardial effusion is present. 10. Mild mitral annular calcification. 11. The mitral valve is degenerative. Mild mitral valve regurgitation. 12. The tricuspid valve is grossly normal. 13. The aortic valve is tricuspid. Aortic valve regurgitation is not visualized. Mild aortic valve stenosis. 14. There is severe calcifcation of the aortic valve. 15. There is severe thickening of the aortic valve. 16. The pulmonic valve was grossly  normal. Pulmonic valve regurgitation is not visualized. 17. Mildly elevated pulmonary artery  systolic pressure. 18. The tricuspid regurgitant velocity is 3.02 m/s, and with an assumed right atrial pressure of 8 mmHg, the estimated right ventricular systolic pressure is mildly elevated at 44.5 mmHg. 19. The inferior vena cava is dilated in size with >50% respiratory variability, suggesting right atrial pressure of 8 mmHg. 20. A prior study was performed on 08/18/2017. 21. No significant change from prior study.  Antimicrobials:   None   Subjective: Patient seen and examined at bedside, not responsive, unable to tolerate orally, in respiratory distress.  Unable to perform ROS    Objective: Vitals:   02/20/19 0657 02/20/19 1030 02/20/19 1134 02/20/19 1332  BP: 107/65 104/69 106/66 107/61  Pulse: 97 98 96 93  Resp: (!) 30 (!) 26 (!) 28 (!) 34  Temp: 100.2 F (37.9 C) 99.1 F (37.3 C) 98.7 F (37.1 C) 99.6 F (37.6 C)  TempSrc:  Axillary Axillary Oral  SpO2: 96% 90% (!) 89% 93%  Weight:      Height:        Intake/Output Summary (Last 24 hours) at 02/20/2019 1627 Last data filed at 02/20/2019 1030 Gross per 24 hour  Intake 1289.49 ml  Output 550 ml  Net 739.49 ml   Filed Weights   Mar 09, 2019 1721  Weight: 75.6 kg    Examination:  General:  Mild respiratory distress, nonresponsive  Cardiovascular: S1, S2 present  Respiratory:  Coarse breath sounds bilaterally  Abdomen: Soft, nontender, nondistended, bowel sounds present  Musculoskeletal: No bilateral pedal edema noted  Skin: Normal  Psychiatry:  Unable to assess    Data Reviewed: I have personally reviewed following labs and imaging studies  CBC: Recent Labs  Lab 01/31/2019 1341 03-09-19 0327 02/18/19 0354 02/19/19 0251 02/20/19 0454  WBC 18.6* 15.6* 11.0* 10.8* 6.5  NEUTROABS 15.7*  --   --   --   --   HGB 11.5* 10.9* 10.2* 9.3* 8.4*  HCT 35.9* 34.7* 31.7* 29.6* 27.4*  MCV 94.2 96.9 93.8 96.4 97.5  PLT 177 157 131* 148* 034*   Basic Metabolic Panel: Recent Labs  Lab 02/10/2019 1341  2019/03/09 0327 02/18/19 0354 02/19/19 0251 02/20/19 0454  NA 138 140 138 139 139  K 4.1 3.9 4.7 4.0 3.7  CL 108 107 106 109 108  CO2 21* 22 23 23 22   GLUCOSE 147* 151* 194* 156* 159*  BUN 33* 31* 28* 29* 31*  CREATININE 2.00* 1.89* 1.87* 1.88* 1.97*  CALCIUM 9.0 9.2 9.2 9.2 8.9  MG  --   --  1.8  --   --    GFR: Estimated Creatinine Clearance: 22.2 mL/min (A) (by C-G formula based on SCr of 1.97 mg/dL (H)). Liver Function Tests: Recent Labs  Lab 02/10/2019 1341  AST 19  ALT 14  ALKPHOS 48  BILITOT 1.7*  PROT 5.6*  ALBUMIN 3.1*   No results for input(s): LIPASE, AMYLASE in the last 168 hours. No results for input(s): AMMONIA in the last 168 hours. Coagulation Profile: Recent Labs  Lab 02/02/2019 1936  INR 1.1   Cardiac Enzymes: No results for input(s): CKTOTAL, CKMB, CKMBINDEX, TROPONINI in the last 168 hours. BNP (last 3 results) No results for input(s): PROBNP in the last 8760 hours. HbA1C: No results for input(s): HGBA1C in the last 72 hours. CBG: No results for input(s): GLUCAP in the last 168 hours. Lipid Profile: No results for input(s): CHOL, HDL, LDLCALC, TRIG, CHOLHDL, LDLDIRECT in the last 72  hours. Thyroid Function Tests: No results for input(s): TSH, T4TOTAL, FREET4, T3FREE, THYROIDAB in the last 72 hours. Anemia Panel: No results for input(s): VITAMINB12, FOLATE, FERRITIN, TIBC, IRON, RETICCTPCT in the last 72 hours. Sepsis Labs: No results for input(s): PROCALCITON, LATICACIDVEN in the last 168 hours.  Recent Results (from the past 240 hour(s))  SARS CORONAVIRUS 2 (TAT 6-24 HRS) Nasopharyngeal Nasopharyngeal Swab     Status: None   Collection Time: 01/30/2019  4:52 PM   Specimen: Nasopharyngeal Swab  Result Value Ref Range Status   SARS Coronavirus 2 NEGATIVE NEGATIVE Final    Comment: (NOTE) SARS-CoV-2 target nucleic acids are NOT DETECTED. The SARS-CoV-2 RNA is generally detectable in upper and lower respiratory specimens during the acute phase of  infection. Negative results do not preclude SARS-CoV-2 infection, do not rule out co-infections with other pathogens, and should not be used as the sole basis for treatment or other patient management decisions. Negative results must be combined with clinical observations, patient history, and epidemiological information. The expected result is Negative. Fact Sheet for Patients: HairSlick.no Fact Sheet for Healthcare Providers: quierodirigir.com This test is not yet approved or cleared by the [place] FDA and  has been authorized for detection and/or diagnosis of SARS-CoV-2 by FDA under an Emergency Use Authorization (EUA). This EUA will remain  in effect (meaning this test can be used) for the duration of the COVID-19 declaration under Section 56 4(b)(1) of the Act, 21 U.S.C. section 360bbb-3(b)(1), unless the authorization is terminated or revoked sooner. Performed at [facility], [address] [place], [place] [address]   Surgical pcr screen     Status: None   Collection Time: 02-24-2019  1:05 PM   Specimen: Nasal Mucosa; Nasal Swab  Result Value Ref Range Status   MRSA, PCR NEGATIVE NEGATIVE Final   Staphylococcus aureus NEGATIVE NEGATIVE Final    Comment: (NOTE) The Xpert SA Assay (FDA approved for NASAL specimens in patients 69 years of age and older), is one component of a comprehensive surveillance program. It is not intended to diagnose infection nor to guide or monitor treatment. Performed at [facility], [address], [place], [place] [address]          Radiology Studies: [facility] Chest Port 1 View  Result Date: 02/20/2019 CLINICAL DATA:  Fever and weakness. EXAM: PORTABLE CHEST 1 VIEW COMPARISON:  02/03/2019 FINDINGS: Mild cardiac enlargement. Decreased lung volumes. Left pleural effusion. Mild scratch set diffuse bilateral interstitial opacities identified compatible with pulmonary  edema IMPRESSION: 1. Left pleural effusion and mild increase interstitial markings suggesting pulmonary edema. Correlate for any clinical signs or symptoms of CHF. Electronically Signed   By: Signa Kell M.D.   On: 02/20/2019 08:32        Scheduled Meds: . mouth rinse  15 mL Mouth Rinse BID  . scopolamine  1 patch Transdermal Q72H  . sodium chloride flush  3 mL Intravenous Once   Continuous Infusions: . morphine 1 mg/hr (02/20/19 1436)     LOS: 4 days     Briant Cedar, MD [facility] 02/20/2019, 4:27 PM

## 2019-02-20 NOTE — Progress Notes (Signed)
Patient's daughter, Lupita Leash, called and was updated on plan of care and patient's status, Wife at bedside now. Pt continues to rest quietly in bed, no signs of distress or discomfort. Will continue to monitor.

## 2019-02-20 NOTE — Progress Notes (Signed)
Pt is now comfort care. CN & AC notified and 4 visitors allowed at this time. Pt's wife [name]) is at bedside. Pt's 3 daughters will also be visiting: [name], [name], [name].   Morphine drip started as ordered, verified by second RN. Patient not alert, does not open eyes or follow commands. Pt is fidgety with his hands at times, but is resting comfortably currently. Bereavement cart ordered from kitchen for family. Will continue to monitor closely, and provided support as needed.

## 2019-02-22 LAB — URINE CULTURE: Culture: >=100000 — AB

## 2019-02-25 LAB — CULTURE, BLOOD (ROUTINE X 2)
Culture: NO GROWTH
Culture: NO GROWTH
Special Requests: ADEQUATE
Special Requests: ADEQUATE

## 2019-03-22 NOTE — Progress Notes (Signed)
Wasted 36mL of morphine in stericycle with Rennie Plowman, RN.

## 2019-03-22 NOTE — Discharge Summary (Signed)
Death Summary  Mike JumboHerley G Stiefel ZOX:[id number] DOB: 17-Jan-1923 DOA: 10/25/18  PCP: Merlene LaughterStoneking, Hal, MD  Admit date: 10/25/18 Date of Death: 02/21/2019 Time of Death: 2:45 am Notification: Merlene LaughterStoneking, Hal, MD notified of death   History of present illness:  [name] is a 86 y.o.malewith Past medical history ofdementia, CAD, type II DM, HTN, GERD who presented from his independent living facility with progressive confusion. Patient was brought into the hospital by caregiver as the caregiver reported that the patient is significantly confused for last few days and not being himself. At his baseline the patient is alert awake and oriented x2 and pleasantly confused not agitated. Reportedly per EDP's discussion withfamily, patient has progressive cognitive decline for last 2 weeks. Over last 2 days patient has been refusing to bear any weight on his left leg he is also more fatigue and tired. In the ED, CT abdomen is positive for left-sided femur fracture. Patient was referred for admission for further work-up and treatment. Pt was a DNR on admission. Due to progressive decline, pt was transitioned to comfort care and patient passed away in the hospital.  Final Diagnoses:  Left hip fracture s/p ORIF IMN on 02/03/2019 by Dr Ophelia CharterYates Unwitnessed fall CT abdomen/pelvis showed a comminuted fracture proximal left femur s/p repair as mentioned above Transitioned to comfort care  Unwitnessed fall CT head without IV contrast with no acute findings with age-related atrophy and chronic microvascular changes and old left frontal infarct  Elevated troponin Hx CAD s/p DES 2016 Troponin on presentation elevated at 186, 213 Etiology likely demand ischemia from recent fall TTE with EF 50-55%, grade 1 diastolic dysfunction  Dysphagia Question possible silent aspiration events, likely related to his advancing dementia Comfort care  Dementia with behavioral disturbances Acute metabolic  encephalopathy Adult failure to thrive Comfort care  Essential hypertension Comfort care  CKD stage IIIb    The results of significant diagnostics from this hospitalization (including imaging, microbiology, ancillary and laboratory) are listed below for reference.    Significant Diagnostic Studies: CT ABDOMEN PELVIS WO CONTRAST  Result Date: 10/25/18 CLINICAL DATA:  Left-sided abdominal pain EXAM: CT ABDOMEN AND PELVIS WITHOUT CONTRAST TECHNIQUE: Multidetector CT imaging of the abdomen and pelvis was performed following the standard protocol without IV contrast. COMPARISON:  03/07/2018 FINDINGS: Lower chest: Areas of chronic scarring are again noted in the lung bases bilaterally. Hepatobiliary: Single gallstone is noted within a decompressed gallbladder. The liver is within normal limits. Pancreas: Unremarkable. No pancreatic ductal dilatation or surrounding inflammatory changes. Spleen: Normal in size without focal abnormality. Adrenals/Urinary Tract: Mild adrenal hypertrophy is noted stable from the prior exam. Atrophic left kidney is noted with scattered calcifications. These are stable in appearance from the prior exam. The right kidney appears within normal limits. No obstructive changes are seen. The bladder is well distended. There are multiple calcifications identified in the distal right ureter which appears stable from the prior exam. These do not cause obstructive change. Stomach/Bowel: Mild diverticular change of the colon is noted. Some prominent fecal material is noted within the rectum although no definitive impaction is seen. The appendix is not well visualized. No inflammatory changes are noted. The small bowel is within normal limits. Small sliding-type hiatal hernia is again noted. Vascular/Lymphatic: Aortic atherosclerosis. No enlarged abdominal or pelvic lymph nodes. Reproductive: Prostate is unremarkable. Other: No abdominal wall hernia or abnormality. No abdominopelvic  ascites. Musculoskeletal: Right hip replacement is noted. There is a left intratrochanteric femoral fracture with mild impaction identified at  the fracture site. This extends into the femoral neck. This is new from the prior exam. Multilevel compression deformities are again noted in the lumbar spine stable from the prior study. IMPRESSION: Comminuted fracture of the proximal left femur with impaction at the fracture site. The fracture involves the intratrochanteric region and extends into the level of the femoral neck. This is likely the etiology of the left-sided pain. Diverticulosis without diverticulitis. Distal right ureteral stones without obstructive change. These are stable in appearance from the prior exam. Cholelithiasis without complicating factors. Electronically Signed   By: Alcide CleverMark  Lukens M.D.   On: 02/10/2019 16:27   DG Chest 1 View  Result Date: 02/11/2019 CLINICAL DATA:  Increased confusion EXAM: CHEST  1 VIEW COMPARISON:  July 28, 2018. FINDINGS: No new consolidation or edema. No pleural effusion or pneumothorax. Cardiomediastinal contours are stable. IMPRESSION: No acute process in the chest. Electronically Signed   By: Guadlupe SpanishPraneil  Patel M.D.   On: 02/15/2019 15:34   CT Head Wo Contrast  Result Date: 01/30/2019 CLINICAL DATA:  86 year old male with altered mental status. EXAM: CT HEAD WITHOUT CONTRAST TECHNIQUE: Contiguous axial images were obtained from the base of the skull through the vertex without intravenous contrast. COMPARISON:  Head CT dated 10/24/2018. FINDINGS: Brain: Moderate to advanced age-related atrophy and chronic microvascular ischemic changes. 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 There is no acute intracranial hemorrhage. No mass effect or midline shift. No extra-axial fluid collection. Vascular: No hyperdense vessel or unexpected calcification. Skull: Normal. Negative for fracture or focal lesion. Sinuses/Orbits: No acute finding. Other: None IMPRESSION: 1. No  acute intracranial hemorrhage. 2. Age-related atrophy and chronic microvascular ischemic changes. 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 Electronically Signed   By: Elgie CollardArash  Radparvar M.D.   On: 02/06/2019 16:22   DG Chest Port 1 View  Result Date: 02/20/2019 CLINICAL DATA:  Fever and weakness. EXAM: PORTABLE CHEST 1 VIEW COMPARISON:  01/26/2019 FINDINGS: Mild cardiac enlargement. Decreased lung volumes. Left pleural effusion. Mild scratch set diffuse bilateral interstitial opacities identified compatible with pulmonary edema IMPRESSION: 1. Left pleural effusion and mild increase interstitial markings suggesting pulmonary edema. Correlate for any clinical signs or symptoms of CHF. Electronically Signed   By: Signa Kellaylor  Stroud M.D.   On: 02/20/2019 08:32   Chest Portable 1 View  Result Date: 01/22/2019 CLINICAL DATA:  Preop chest x-ray EXAM: PORTABLE CHEST 1 VIEW COMPARISON:  07/28/2018 FINDINGS: There is mild bilateral interstitial thickening likely chronic. There is elevation of the left diaphragm. There is left basilar airspace disease likely reflecting atelectasis versus less likely pneumonia. There is no pleural effusion or pneumothorax. The heart mediastinum are stable. There is no acute osseous abnormality. IMPRESSION: 1. Left basilar airspace disease likely reflecting atelectasis versus less likely pneumonia. Otherwise, no acute cardiopulmonary disease. Electronically Signed   By: Elige KoHetal  Patel   On: 02/03/2019 18:10   DG C-Arm 1-60 Min-No Report  Result Date: August 06, 2018 Fluoroscopy was utilized by the requesting physician.  No radiographic interpretation.   ECHOCARDIOGRAM COMPLETE  Result Date: August 06, 2018   ECHOCARDIOGRAM REPORT   Patient Name:   Lonell GrandchildHERLEY G Litt Date of Exam: August 06, 2018 Medical Rec #:  [id number]      Height: Accession #:    [id number]     Weight: Date of Birth:  1922-03-05      BSA: Patient Age:    86 years       BP:           130/75 mmHg Patient Gender: M  HR:            79 bpm. Exam Location:  Inpatient Procedure: 2D Echo, Cardiac Doppler and Color Doppler Indications:    Z01.818 Encounter for other preprocedural examination; R55                 Syncope  History:        Patient has prior history of Echocardiogram examinations, most                 recent 08/18/2017. CAD, Signs/Symptoms:Altered Mental Status and                 Alzheimer's; Risk Factors:Diabetes, Hypertension and                 Dyslipidemia.  Sonographer:    Sheralyn Boatman RDCS Referring Phys: [id number] [facility] M PATEL  Sonographer Comments: Technically difficult study due to poor echo windows. Image acquisition challenging due to respiratory motion. Patient could not follow directions or turn. Patient was moaning, coughing, and audible lung craclkles made the study difficult. IMPRESSIONS  1. Left ventricular ejection fraction, by visual estimation, is 50 to 55%. The left ventricle has low normal function. There is mildly increased left ventricular hypertrophy.  2. Abnormal septal motion consistent with left bundle branch block.  3. Left ventricular diastolic parameters are consistent with Grade I diastolic dysfunction (impaired relaxation).  4. The left ventricle has no regional wall motion abnormalities.  5. Global right ventricle has normal systolic function.The right ventricular size is normal. No increase in right ventricular wall thickness.  6. Left atrial size was normal.  7. Right atrial size was normal.  8. Presence of pericardial fat pad.  9. Trivial pericardial effusion is present. 10. Mild mitral annular calcification. 11. The mitral valve is degenerative. Mild mitral valve regurgitation. 12. The tricuspid valve is grossly normal. 13. The aortic valve is tricuspid. Aortic valve regurgitation is not visualized. Mild aortic valve stenosis. 14. There is severe calcifcation of the aortic valve. 15. There is severe thickening of the aortic valve. 16. The pulmonic valve was grossly normal. Pulmonic valve  regurgitation is not visualized. 17. Mildly elevated pulmonary artery systolic pressure. 18. The tricuspid regurgitant velocity is 3.02 m/s, and with an assumed right atrial pressure of 8 mmHg, the estimated right ventricular systolic pressure is mildly elevated at 44.5 mmHg. 19. The inferior vena cava is dilated in size with >50% respiratory variability, suggesting right atrial pressure of 8 mmHg. 20. A prior study was performed on 08/18/2017. 21. No significant change from prior study. FINDINGS  Left Ventricle: Left ventricular ejection fraction, by visual estimation, is 50 to 55%. The left ventricle has low normal function. The left ventricle has no regional wall motion abnormalities. The left ventricular internal cavity size was the left ventricle is normal in size. There is mildly increased left ventricular hypertrophy. Abnormal (paradoxical) septal motion, consistent with left bundle branch block. Left ventricular diastolic parameters are consistent with Grade I diastolic dysfunction (impaired relaxation). Normal left atrial pressure. Right Ventricle: The right ventricular size is normal. No increase in right ventricular wall thickness. Global RV systolic function is has normal systolic function. The tricuspid regurgitant velocity is 3.02 m/s, and with an assumed right atrial pressure  of 8 mmHg, the estimated right ventricular systolic pressure is mildly elevated at 44.5 mmHg. Left Atrium: Left atrial size was normal in size. Right Atrium: Right atrial size was normal in size Pericardium: Trivial pericardial effusion is present. Presence of pericardial fat  pad. Mitral Valve: The mitral valve is degenerative in appearance. Mild mitral annular calcification. Mild mitral valve regurgitation. Tricuspid Valve: The tricuspid valve is grossly normal. Tricuspid valve regurgitation is trivial. Aortic Valve: The aortic valve is tricuspid. . There is severe thickening and severe calcifcation of the aortic valve. Aortic  valve regurgitation is not visualized. Mild aortic stenosis is present. There is severe thickening of the aortic valve. There is  severe calcifcation of the aortic valve. Aortic valve mean gradient measures 5.4 mmHg. Aortic valve peak gradient measures 10.5 mmHg. Aortic valve area, by VTI measures 1.38 cm. Pulmonic Valve: The pulmonic valve was grossly normal. Pulmonic valve regurgitation is not visualized. Pulmonic regurgitation is not visualized. Aorta: The aortic root and ascending aorta are structurally normal, with no evidence of dilitation. Venous: The inferior vena cava is dilated in size with greater than 50% respiratory variability, suggesting right atrial pressure of 8 mmHg. IAS/Shunts: No atrial level shunt detected by color flow Doppler. Additional Comments: A prior study was performed on 08/18/2017.  LEFT VENTRICLE PLAX 2D LVIDd:         3.50 cm LVIDs:         2.70 cm LV PW:         1.10 cm LV IVS:        1.30 cm LVOT diam:     1.80 cm LV SV:         24 ml LVOT Area:     2.54 cm  RIGHT VENTRICLE RV S prime:     10.00 cm/s LEFT ATRIUM LA Vol (A2C): 26.7 ml LA Vol (A4C): 21.2 ml  AORTIC VALVE AV Area (Vmax):    1.73 cm AV Area (Vmean):   1.69 cm AV Area (VTI):     1.38 cm AV Vmax:           162.34 cm/s AV Vmean:          107.278 cm/s AV VTI:            0.291 m AV Peak Grad:      10.5 mmHg AV Mean Grad:      5.4 mmHg LVOT Vmax:         110.18 cm/s LVOT Vmean:        71.284 cm/s LVOT VTI:          0.158 m LVOT/AV VTI ratio: 0.54  AORTA Ao Root diam: 3.50 cm Ao Asc diam:  3.00 cm MV E velocity: 74.00 cm/s  103 cm/s  TRICUSPID VALVE MV A velocity: 130.00 cm/s 70.3 cm/s TR Peak grad:   36.5 mmHg MV E/A ratio:  0.57        1.5       TR Vmax:        301.88 cm/s                                       SHUNTS                                      Systemic VTI:  0.16 m                                      Systemic Diam: 1.80 cm  Lennie Odor  MD Electronically signed by Lennie Odor MD Signature Date/Time:  03/10/2019/10:45:42 AM    Final     Microbiology: Recent Results (from the past 240 hour(s))  Culture, blood (routine x 2)     Status: None   Collection Time: 02/20/19  8:08 AM   Specimen: BLOOD  Result Value Ref Range Status   Specimen Description   Final    BLOOD RIGHT ARM Performed at [facility], [address] [place], [place] [address]    Special Requests   Final    BOTTLES DRAWN AEROBIC AND ANAEROBIC Blood Culture adequate volume Performed at [facility], [address] [place], [place] [address]    Culture   Final    NO GROWTH 5 DAYS Performed at [facility], [address], [place], [place] [address]    Report Status 02/25/2019 FINAL  Final  Culture, blood (routine x 2)     Status: None   Collection Time: 02/20/19  8:08 AM   Specimen: BLOOD  Result Value Ref Range Status   Specimen Description   Final    BLOOD RIGHT HAND Performed at [facility], [address] [place], [place] [address]    Special Requests   Final    BOTTLES DRAWN AEROBIC ONLY Blood Culture adequate volume Performed at [facility], [address] [place], [place] [address]    Culture   Final    NO GROWTH 5 DAYS Performed at [facility], [address], [place], [place] [address]    Report Status 02/25/2019 FINAL  Final  Culture, Urine     Status: Abnormal   Collection Time: 02/20/19  1:37 PM   Specimen: Urine, Clean Catch  Result Value Ref Range Status   Specimen Description   Final    URINE, CLEAN CATCH Performed at [facility], [address] [place], [place] [address]    Special Requests   Final    NONE Performed at [facility], [address], [place], [place] [address]    Culture >=100,000 COLONIES/mL PSEUDOMONAS AERUGINOSA (A)  Final   Report Status 02/22/2019 FINAL  Final   Organism ID, Bacteria PSEUDOMONAS AERUGINOSA (A)  Final      Susceptibility   Pseudomonas aeruginosa  - MIC*    CEFTAZIDIME 2 SENSITIVE Sensitive     CIPROFLOXACIN <=0.25 SENSITIVE Sensitive     GENTAMICIN <=1 SENSITIVE Sensitive     IMIPENEM 2 SENSITIVE Sensitive     PIP/TAZO 8 SENSITIVE Sensitive     CEFEPIME 2 SENSITIVE Sensitive     * >=100,000 COLONIES/mL PSEUDOMONAS AERUGINOSA     Labs: Basic Metabolic Panel: No results for input(s): NA, K, CL, CO2, GLUCOSE, BUN, CREATININE, CALCIUM, MG, PHOS in the last 168 hours. Liver Function Tests: No results for input(s): AST, ALT, ALKPHOS, BILITOT, PROT, ALBUMIN in the last 168 hours. No results for input(s): LIPASE, AMYLASE in the last 168 hours. No results for input(s): AMMONIA in the last 168 hours. CBC: No results for input(s): WBC, NEUTROABS, HGB, HCT, MCV, PLT in the last 168 hours. Cardiac Enzymes: No results for input(s): CKTOTAL, CKMB, CKMBINDEX, TROPONINI in the last 168 hours. D-Dimer No results for input(s): DDIMER in the last 72 hours. BNP: Invalid input(s): POCBNP CBG: No results for input(s): GLUCAP in the last 168 hours. Anemia work up No results for input(s): VITAMINB12, FOLATE, FERRITIN, TIBC, IRON, RETICCTPCT in the last 72 hours.  Urinalysis    Component Value Date/Time   COLORURINE YELLOW 02/20/2019 1337   APPEARANCEUR CLEAR 02/20/2019 1337   LABSPEC 1.018 02/20/2019 1337   PHURINE 5.0 02/20/2019 1337   GLUCOSEU 50 (A) 02/20/2019 1337   HGBUR SMALL (A) 02/20/2019 1337   BILIRUBINUR NEGATIVE 02/20/2019 1337   KETONESUR NEGATIVE 02/20/2019 1337   PROTEINUR 30 (A) 02/20/2019 1337   NITRITE NEGATIVE 02/20/2019 1337   LEUKOCYTESUR NEGATIVE 02/20/2019 1337   Sepsis Labs Invalid input(s): PROCALCITONIN,  WBC,  LACTICIDVEN     SIGNED:  Alma Friendly, MD  [facility] 02/28/2019, 5:52 PM   If 7PM-7AM, please contact night-coverage

## 2019-03-22 NOTE — Progress Notes (Signed)
   2019/02/22 0245  Attending Physican Contact  Attending Physician Notified Y  Attending Physician (First and Last Name) Renda Rolls  Will the above attending physician sign death certificate? Yes  Post Mortem Checklist  Date of Death Feb 22, 2019  Time of Death 0245  Pronounced By Pilar Grammes,  Rennie Plowman, RN  Next of kin notified Yes  Name of next of kin notified of death [place]  Contact Person's Relationship to Patient Daughter  Contact Person's Phone Number [phone]  Contact Person's address [address], [place]   Was the patient a No Code Blue or a Limited Code Blue? Yes  Did the patient die unattended? Yes  Patient restrained? Not applicable  Height 5\' 8"  (1.727 m)  Weight 75.6 kg  Body preparation complete Y  Donor Services  Notification Date 2019-02-22  Notification Time 0302  [place] [facility] Service Number 04/21/19 (ext. 015, 04-13-1983)  Is patient a potential donor? N  Autopsy  Autopsy requested by N/A  Patient Belongings/Medications Returned  Patient belongings from bedside/safe/pharmacy returned  None  Dead on Arrival (Emergency Department)  Patient dead on arrival? No  Notifications  Patient Placement notified that Post Mortem checklist is complete Yes  Patient Placement notified body transferred Transported to [email or web address]  Medical Examiner  Is this a medical examiner's case? Y  Date Medical Examiner notified Feb 22, 2019  Time Medical Examiner notified [phone]  Name of Medical Examiner [id number]  Name of person who notified medical examiner kim Waldemar Dickens RN   Does the Medical Examiner consider this an ME case? [facility]  Funeral home name/address/phone # [facility] [address] [place]  Planned location of pickup [place]

## 2019-03-22 DEATH — deceased

## 2019-05-29 IMAGING — CT CT HEAD W/O CM
3 of 4 series · 16 of 47 positions shown, 19 images · non-contrast
Comparison: Head CT dated 07/03/2017

CLINICAL DATA: [AGE] male with syncope.

EXAM:
CT HEAD WITHOUT CONTRAST
TECHNIQUE: Contiguous axial images were obtained from the base of the skull
through the vertex without intravenous contrast.

[Series 4: head 2.0 h70h · axial · 0.45mm/px · z∈[-88,+58]mm · 10 of 83 slices shown, 13 images]
[im 5/83  brain]
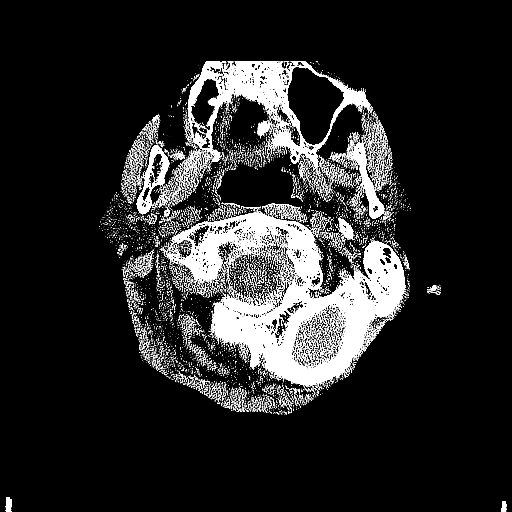
[im 5/83  bone]
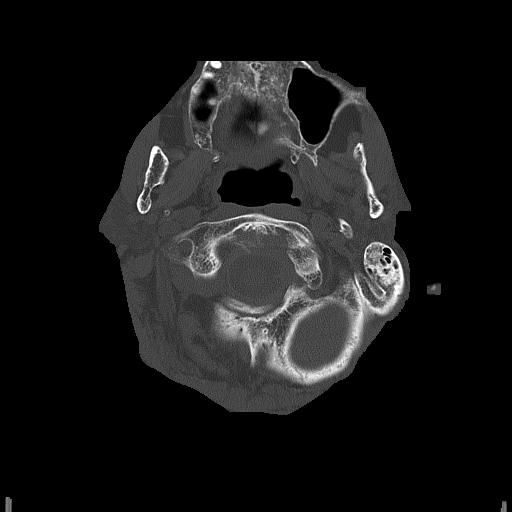
[im 13/83  brain]
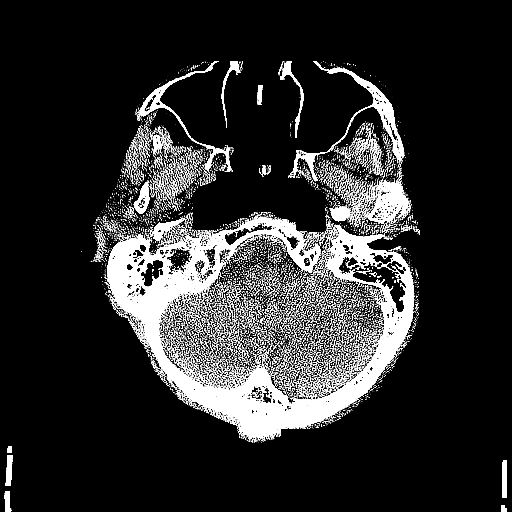
[im 21/83  brain]
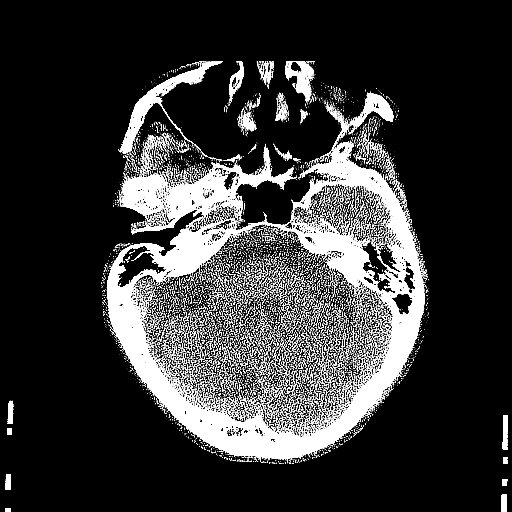
[im 29/83  brain]
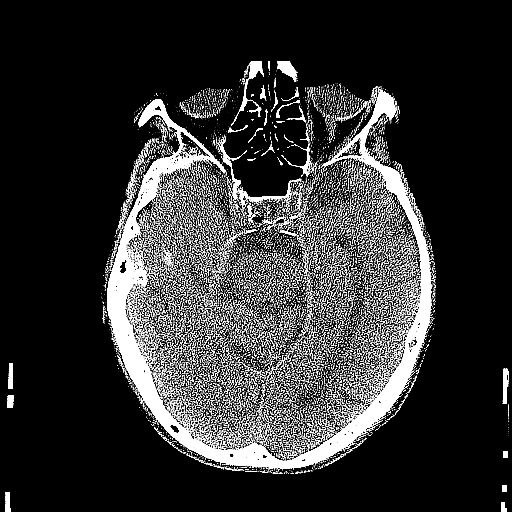
[im 37/83  brain]
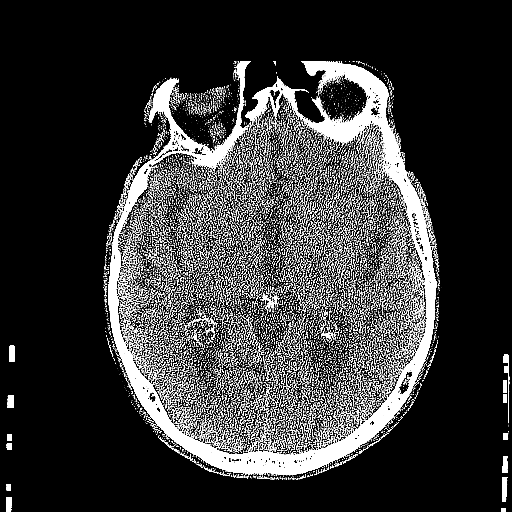
[im 37/83  bone]
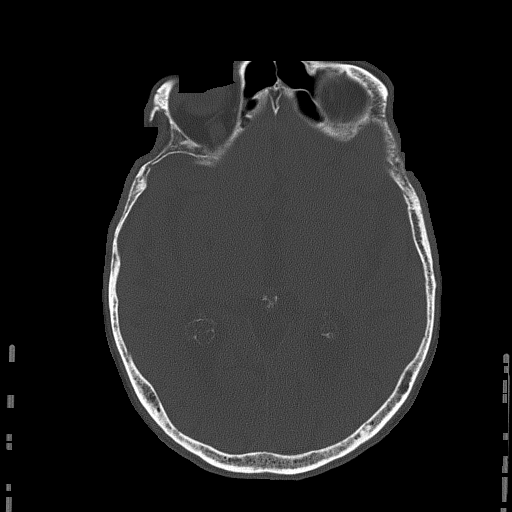
[im 46/83  brain]
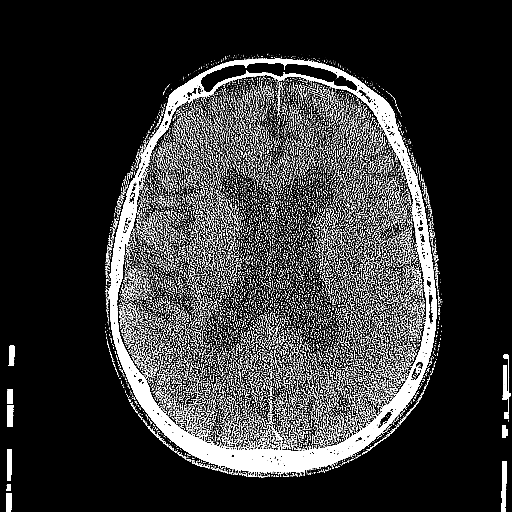
[im 54/83  brain]
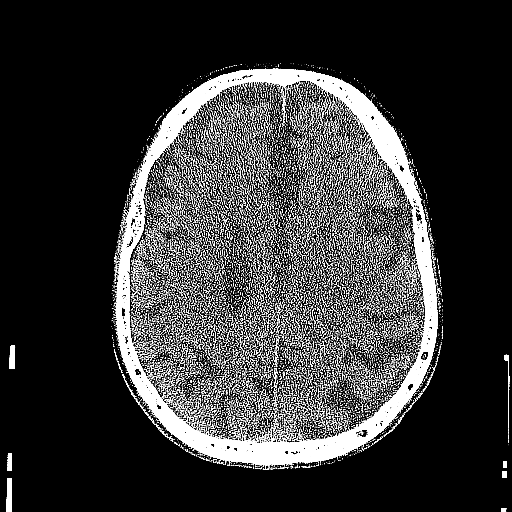
[im 62/83  brain]
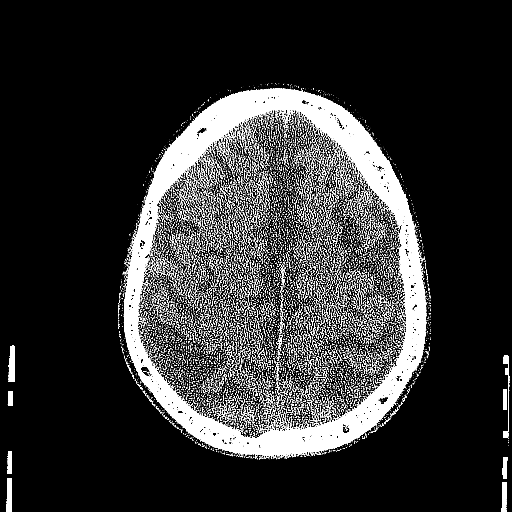
[im 70/83  brain]
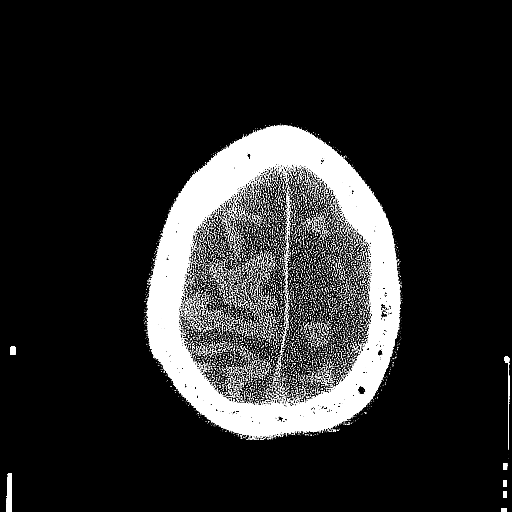
[im 70/83  bone]
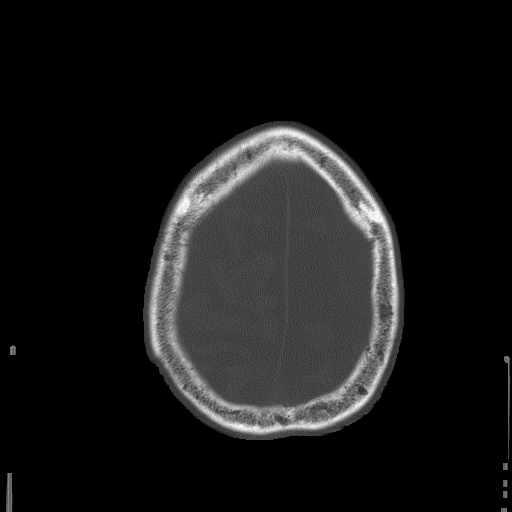
[im 78/83  brain]
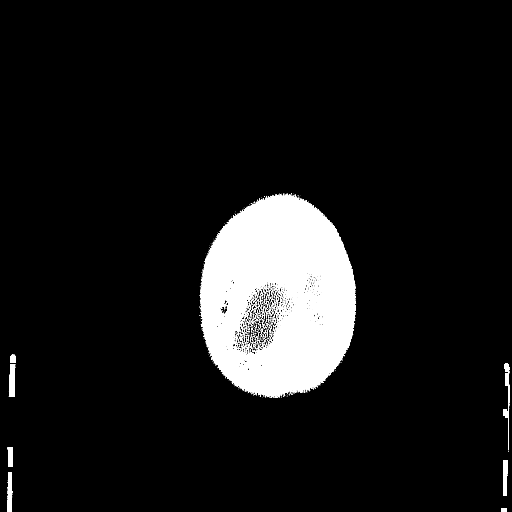

[Series 5: head 3.0 mpr cor · coronal · 0.33mm/px · 3 of 70 slices shown]
[im 24/70  brain]
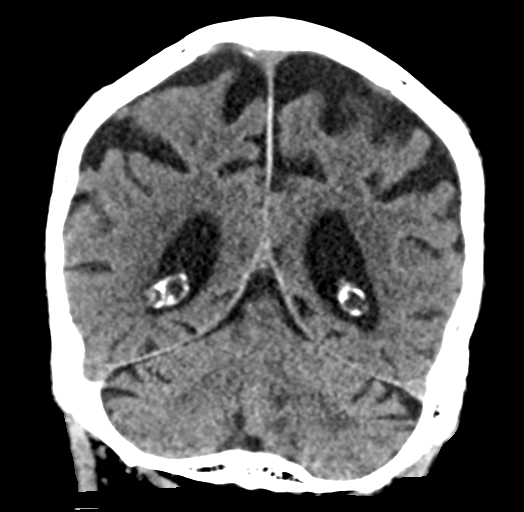
[im 31/70  brain]
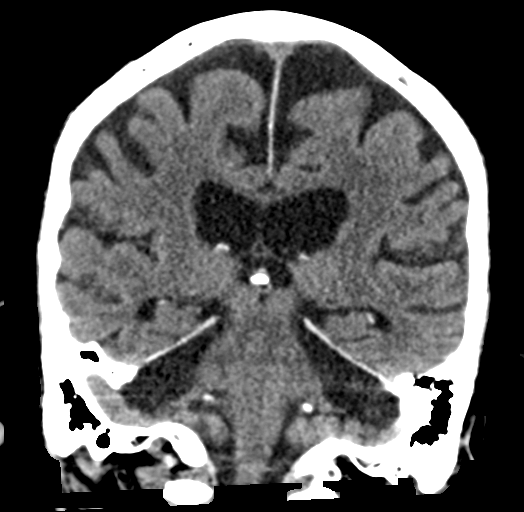
[im 39/70  brain]
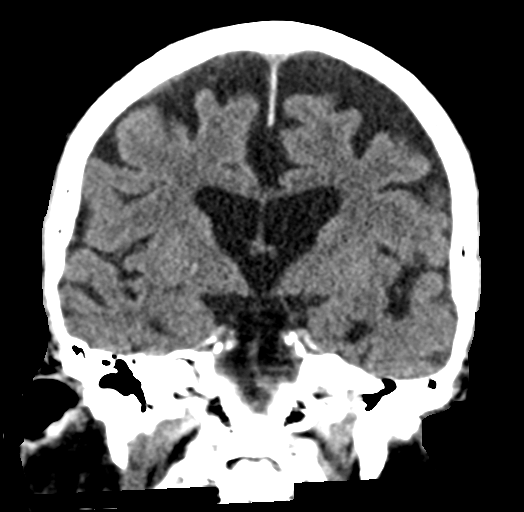

[Series 6: head 3.0 mpr sag · sagittal · 0.34mm/px · 3 of 55 slices shown]
[im 19/55  brain]
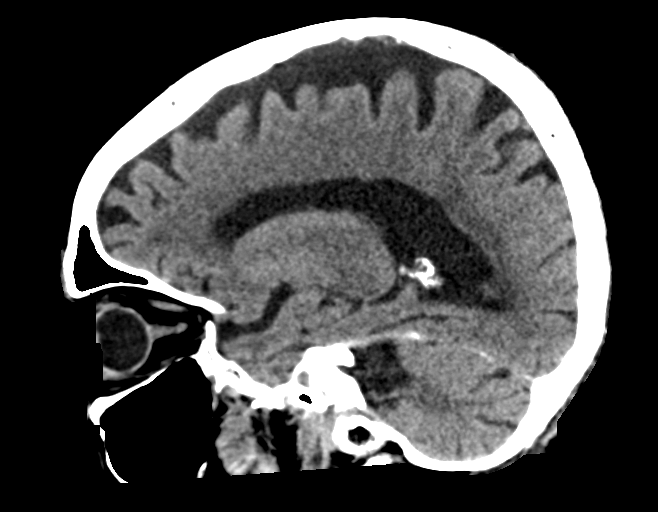
[im 28/55  brain]
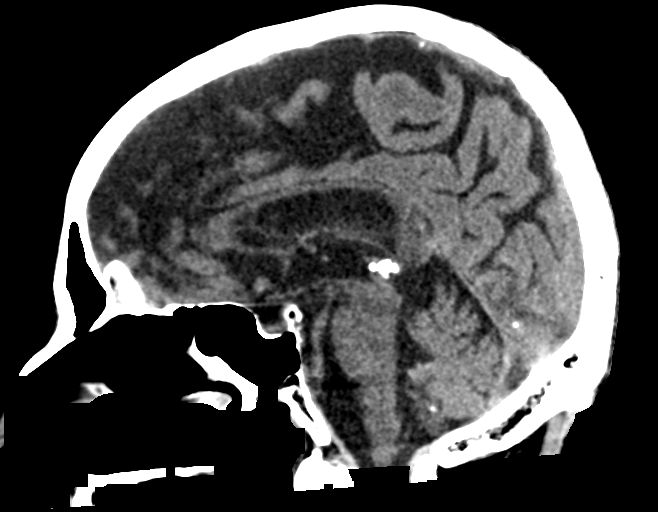
[im 37/55  brain]
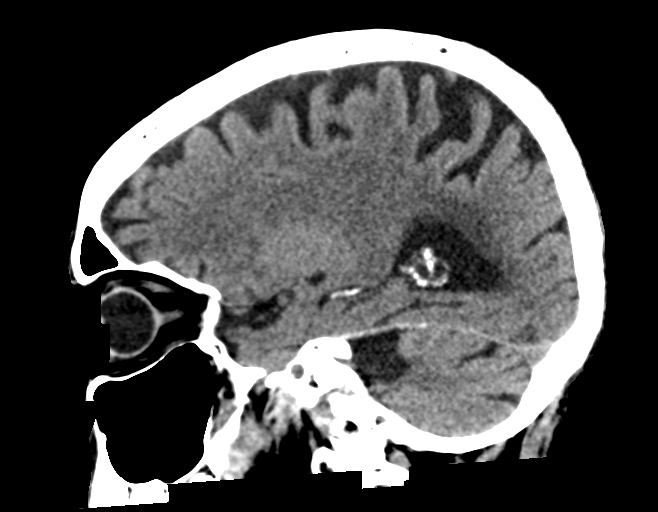

[16 of 47 positions shown; findings below may reference images not displayed]

FINDINGS: Brain: There is mild to moderate age-related atrophy and chronic
microvascular ischemic changes. Focal area of old infarct and
encephalomalacia the left frontal lobe. There is no acute
intracranial hemorrhage. No mass effect or midline shift. No
extra-axial fluid collection.

Vascular: No hyperdense vessel or unexpected calcification.

Skull: Normal. Negative for fracture or focal lesion.

Sinuses/Orbits: No acute finding.

Other: None
IMPRESSION: 1. No acute intracranial hemorrhage.
2. Age-related atrophy and chronic microvascular ischemic changes.
